# Patient Record
Sex: Female | Born: 1945
Health system: Southern US, Community
[De-identification: ages and names within clinical notes are randomized; demographics above are authoritative.]

## PROBLEM LIST (undated history)

## (undated) DIAGNOSIS — F32A Depression, unspecified: Secondary | ICD-10-CM

## (undated) DIAGNOSIS — J449 Chronic obstructive pulmonary disease, unspecified: Secondary | ICD-10-CM

## (undated) DIAGNOSIS — K648 Other hemorrhoids: Secondary | ICD-10-CM

## (undated) DIAGNOSIS — T7840XA Allergy, unspecified, initial encounter: Secondary | ICD-10-CM

## (undated) DIAGNOSIS — L900 Lichen sclerosus et atrophicus: Secondary | ICD-10-CM

## (undated) DIAGNOSIS — M4800 Spinal stenosis, site unspecified: Secondary | ICD-10-CM

## (undated) DIAGNOSIS — F419 Anxiety disorder, unspecified: Secondary | ICD-10-CM

## (undated) DIAGNOSIS — Z720 Tobacco use: Secondary | ICD-10-CM

## (undated) DIAGNOSIS — K219 Gastro-esophageal reflux disease without esophagitis: Secondary | ICD-10-CM

## (undated) DIAGNOSIS — M810 Age-related osteoporosis without current pathological fracture: Secondary | ICD-10-CM

## (undated) DIAGNOSIS — I10 Essential (primary) hypertension: Secondary | ICD-10-CM

## (undated) DIAGNOSIS — F329 Major depressive disorder, single episode, unspecified: Secondary | ICD-10-CM

## (undated) HISTORY — DX: Gastro-esophageal reflux disease without esophagitis: K21.9

## (undated) HISTORY — DX: Essential (primary) hypertension: I10

## (undated) HISTORY — DX: Other hemorrhoids: K64.8

## (undated) HISTORY — DX: Allergy, unspecified, initial encounter: T78.40XA

## (undated) HISTORY — DX: Lichen sclerosus et atrophicus: L90.0

## (undated) HISTORY — DX: Major depressive disorder, single episode, unspecified: F32.9

## (undated) HISTORY — DX: Age-related osteoporosis without current pathological fracture: M81.0

## (undated) HISTORY — DX: Depression, unspecified: F32.A

## (undated) HISTORY — DX: Tobacco use: Z72.0

## (undated) HISTORY — PX: BREAST SURGERY: SHX581

## (undated) HISTORY — DX: Chronic obstructive pulmonary disease, unspecified: J44.9

## (undated) HISTORY — DX: Spinal stenosis, site unspecified: M48.00

## (undated) HISTORY — PX: HERNIA REPAIR: SHX51

## (undated) HISTORY — DX: Anxiety disorder, unspecified: F41.9

## (undated) HISTORY — PX: EYE SURGERY: SHX253

## (undated) HISTORY — PX: TUBAL LIGATION: SHX77

## (undated) HISTORY — PX: BREAST EXCISIONAL BIOPSY: SUR124

---

## 2005-02-12 ENCOUNTER — Ambulatory Visit: Payer: Self-pay | Admitting: Orthopaedic Surgery

## 2005-10-13 ENCOUNTER — Ambulatory Visit: Payer: Self-pay | Admitting: Family Medicine

## 2005-10-13 LAB — HM COLONOSCOPY

## 2007-07-21 ENCOUNTER — Emergency Department (HOSPITAL_COMMUNITY): Admission: EM | Admit: 2007-07-21 | Discharge: 2007-07-21 | Payer: Self-pay | Admitting: Emergency Medicine

## 2010-07-17 LAB — HM PAP SMEAR

## 2010-07-23 ENCOUNTER — Ambulatory Visit: Payer: Self-pay

## 2011-01-07 LAB — BASIC METABOLIC PANEL
CO2: 25
Chloride: 104
GFR calc Af Amer: >60
Glucose, Bld: 108 — ABNORMAL HIGH
Potassium: 3.4 — ABNORMAL LOW
Sodium: 138

## 2012-07-11 ENCOUNTER — Emergency Department: Payer: Self-pay | Admitting: Emergency Medicine

## 2014-03-03 ENCOUNTER — Ambulatory Visit: Payer: Self-pay

## 2014-03-03 LAB — HM MAMMOGRAPHY

## 2014-03-05 ENCOUNTER — Emergency Department: Payer: Self-pay | Admitting: Emergency Medicine

## 2014-05-31 ENCOUNTER — Ambulatory Visit: Payer: Self-pay | Admitting: Ophthalmology

## 2014-06-28 ENCOUNTER — Ambulatory Visit: Payer: Self-pay | Admitting: Ophthalmology

## 2014-08-11 ENCOUNTER — Other Ambulatory Visit: Payer: Self-pay | Admitting: Unknown Physician Specialty

## 2014-08-11 DIAGNOSIS — M810 Age-related osteoporosis without current pathological fracture: Secondary | ICD-10-CM

## 2014-08-24 ENCOUNTER — Ambulatory Visit: Payer: Self-pay

## 2014-09-06 ENCOUNTER — Ambulatory Visit
Admission: RE | Admit: 2014-09-06 | Discharge: 2014-09-06 | Disposition: A | Discharge status: Home / Self Care | Payer: Medicare Other | Source: Ambulatory Visit | Attending: Unknown Physician Specialty | Admitting: Unknown Physician Specialty

## 2014-09-06 DIAGNOSIS — M810 Age-related osteoporosis without current pathological fracture: Secondary | ICD-10-CM | POA: Insufficient documentation

## 2014-09-06 LAB — HM DEXA SCAN

## 2014-12-11 ENCOUNTER — Other Ambulatory Visit: Payer: Self-pay

## 2014-12-11 MED ORDER — LOSARTAN POTASSIUM 50 MG PO TABS
50.0000 mg | ORAL_TABLET | Freq: Every day | ORAL | Status: DC
Start: 2014-12-11 — End: 2015-03-05

## 2014-12-11 NOTE — Telephone Encounter (Signed)
Patient was last seen 09/04/14 with a follow-up in 6 months, practice partner number is [id number], and pharmacy is [place].

## 2015-02-27 DIAGNOSIS — J449 Chronic obstructive pulmonary disease, unspecified: Secondary | ICD-10-CM | POA: Insufficient documentation

## 2015-02-27 DIAGNOSIS — I10 Essential (primary) hypertension: Secondary | ICD-10-CM | POA: Insufficient documentation

## 2015-02-27 DIAGNOSIS — F329 Major depressive disorder, single episode, unspecified: Secondary | ICD-10-CM | POA: Insufficient documentation

## 2015-02-27 DIAGNOSIS — F32A Depression, unspecified: Secondary | ICD-10-CM

## 2015-02-27 DIAGNOSIS — F17210 Nicotine dependence, cigarettes, uncomplicated: Secondary | ICD-10-CM | POA: Insufficient documentation

## 2015-02-27 DIAGNOSIS — F411 Generalized anxiety disorder: Secondary | ICD-10-CM | POA: Insufficient documentation

## 2015-02-27 DIAGNOSIS — M4800 Spinal stenosis, site unspecified: Secondary | ICD-10-CM | POA: Insufficient documentation

## 2015-02-27 DIAGNOSIS — K648 Other hemorrhoids: Secondary | ICD-10-CM

## 2015-02-27 DIAGNOSIS — J441 Chronic obstructive pulmonary disease with (acute) exacerbation: Secondary | ICD-10-CM | POA: Insufficient documentation

## 2015-02-27 DIAGNOSIS — Z720 Tobacco use: Secondary | ICD-10-CM

## 2015-02-27 DIAGNOSIS — J309 Allergic rhinitis, unspecified: Secondary | ICD-10-CM

## 2015-02-27 DIAGNOSIS — L900 Lichen sclerosus et atrophicus: Secondary | ICD-10-CM

## 2015-02-27 DIAGNOSIS — K219 Gastro-esophageal reflux disease without esophagitis: Secondary | ICD-10-CM

## 2015-02-27 DIAGNOSIS — F419 Anxiety disorder, unspecified: Secondary | ICD-10-CM

## 2015-02-27 DIAGNOSIS — M810 Age-related osteoporosis without current pathological fracture: Secondary | ICD-10-CM

## 2015-03-05 ENCOUNTER — Ambulatory Visit
Admission: RE | Admit: 2015-03-05 | Discharge: 2015-03-05 | Disposition: A | Discharge status: Home / Self Care | Payer: Medicare Other | Source: Ambulatory Visit | Attending: Unknown Physician Specialty | Admitting: Unknown Physician Specialty

## 2015-03-05 ENCOUNTER — Ambulatory Visit (INDEPENDENT_AMBULATORY_CARE_PROVIDER_SITE_OTHER): Payer: Medicare Other | Admitting: Unknown Physician Specialty

## 2015-03-05 ENCOUNTER — Encounter: Payer: Self-pay | Admitting: Unknown Physician Specialty

## 2015-03-05 VITALS — BP 122/79 | HR 73 | Temp 98.0°F | Ht 61.7 in | Wt 107.9 lb

## 2015-03-05 DIAGNOSIS — R05 Cough: Secondary | ICD-10-CM

## 2015-03-05 DIAGNOSIS — J449 Chronic obstructive pulmonary disease, unspecified: Secondary | ICD-10-CM | POA: Diagnosis not present

## 2015-03-05 DIAGNOSIS — K219 Gastro-esophageal reflux disease without esophagitis: Secondary | ICD-10-CM

## 2015-03-05 DIAGNOSIS — I10 Essential (primary) hypertension: Secondary | ICD-10-CM | POA: Diagnosis not present

## 2015-03-05 DIAGNOSIS — R059 Cough, unspecified: Secondary | ICD-10-CM

## 2015-03-05 MED ORDER — OMEPRAZOLE 20 MG PO CPDR: 20.0000 mg | DELAYED_RELEASE_CAPSULE | Freq: Every day | ORAL | Status: DC

## 2015-03-05 MED ORDER — ALENDRONATE SODIUM 70 MG PO TABS: 70.0000 mg | ORAL_TABLET | ORAL | Status: DC

## 2015-03-05 MED ORDER — ALBUTEROL SULFATE HFA 108 (90 BASE) MCG/ACT IN AERS: 1.0000 | INHALATION_SPRAY | Freq: Four times a day (QID) | RESPIRATORY_TRACT | Status: DC | PRN

## 2015-03-05 MED ORDER — LOSARTAN POTASSIUM 50 MG PO TABS: 50.0000 mg | ORAL_TABLET | Freq: Every day | ORAL | Status: DC

## 2015-03-05 NOTE — Assessment & Plan Note (Addendum)
Recent flare.  May need increase in treatment.  Will get x-ray.  Rx for Symbicort.

## 2015-03-05 NOTE — Progress Notes (Signed)
BP 122/79 mmHg  Pulse 73  Temp(Src) 98 F (36.7 C)  Ht 5' 1.7" (1.567 m)  Wt 107 lb 14.4 oz (48.943 kg)  BMI 19.93 kg/m2  SpO2 97%   Subjective:    Patient ID: Lauren Nolan, female    DOB: Nov 12, 1945, 69 y.o.   MRN: [id number]  HPI: Lauren Nolan is a 69 y.o. female  Chief Complaint  Patient presents with  . Hypertension  . Medication Refill    pt states she needs refill on inhaler, fosamax, and omeprazole   Hypertension Using medications without difficulty   No problems or lightheadedness No chest pain with exertion or shortness of breath No Edema   COPD Feels symptoms are well controlled until recently when she had an increase in cough.  Using Proair BID Using medications without problems Night time symptoms none ER visits since last visit: none Missed work or school::none She continues to smoke  GERD No symptoms while on medications.    Relevant past medical, surgical, family and social history reviewed and updated as indicated. Interim medical history since our last visit reviewed. Allergies and medications reviewed and updated.  Review of Systems  Per HPI unless specifically indicated above     Objective:    BP 122/79 mmHg  Pulse 73  Temp(Src) 98 F (36.7 C)  Ht 5' 1.7" (1.567 m)  Wt 107 lb 14.4 oz (48.943 kg)  BMI 19.93 kg/m2  SpO2 97%  Wt Readings from Last 3 Encounters:  03/05/15 107 lb 14.4 oz (48.943 kg)  09/04/14 109 lb (49.442 kg)    Physical Exam  Constitutional: She is oriented to person, place, and time. She appears well-developed and well-nourished. No distress.  HENT:  Head: Normocephalic and atraumatic.  Eyes: Conjunctivae and lids are normal. Right eye exhibits no discharge. Left eye exhibits no discharge. No scleral icterus.  Cardiovascular: Normal rate, regular rhythm and normal heart sounds.   Pulmonary/Chest: Effort normal. No respiratory distress. She has decreased breath sounds in the left lower field.  Abdominal:  Normal appearance and bowel sounds are normal. She exhibits no distension. There is no splenomegaly or hepatomegaly. There is no tenderness.  Musculoskeletal: Normal range of motion.  Neurological: She is alert and oriented to person, place, and time.  Skin: Skin is intact. No rash noted. No pallor.  Psychiatric: She has a normal mood and affect. Her behavior is normal. Judgment and thought content normal.      Assessment & Plan:   Problem List Items Addressed This Visit      Unprioritized   GERD (gastroesophageal reflux disease)    Stable, continue present medications.       Relevant Medications   omeprazole (PRILOSEC) 20 MG capsule   Hypertension - Primary    Stable, continue present medications.       Relevant Medications   losartan (COZAAR) 50 MG tablet   COPD (chronic obstructive pulmonary disease) (HCC)    Recent flare.  May need increase in treatment.  Will get x-ray.  Rx for Symbicort.        Relevant Medications   albuterol (PROVENTIL HFA;VENTOLIN HFA) 108 (90 BASE) MCG/ACT inhaler   Other Relevant Orders   DG Chest 2 View    Other Visit Diagnoses    Cough        Relevant Orders    DG Chest 2 View        Follow up plan: Return for physical and Spirometry.

## 2015-03-05 NOTE — Assessment & Plan Note (Signed)
Stable, continue present medications.   

## 2015-04-02 ENCOUNTER — Encounter: Payer: Self-pay | Admitting: Family Medicine

## 2015-04-02 ENCOUNTER — Ambulatory Visit (INDEPENDENT_AMBULATORY_CARE_PROVIDER_SITE_OTHER): Payer: Medicare Other | Admitting: Family Medicine

## 2015-04-02 VITALS — BP 133/78 | HR 118 | Temp 98.7°F | Ht 61.3 in | Wt 108.0 lb

## 2015-04-02 DIAGNOSIS — J329 Chronic sinusitis, unspecified: Secondary | ICD-10-CM | POA: Insufficient documentation

## 2015-04-02 DIAGNOSIS — J019 Acute sinusitis, unspecified: Secondary | ICD-10-CM | POA: Diagnosis not present

## 2015-04-02 DIAGNOSIS — J449 Chronic obstructive pulmonary disease, unspecified: Secondary | ICD-10-CM

## 2015-04-02 MED ORDER — AMOXICILLIN-POT CLAVULANATE 875-125 MG PO TABS: 1.0000 | ORAL_TABLET | Freq: Two times a day (BID) | ORAL | Status: DC

## 2015-04-02 NOTE — Assessment & Plan Note (Signed)
Discussed sinusitis care and treatment use of antibiotics in 10 years over-the-counter medications such as Tylenol Cold and sinus Allegra-D etc.

## 2015-04-02 NOTE — Assessment & Plan Note (Signed)
Is cut back on smoking and has stopping smoking as a New Year's resolution

## 2015-04-02 NOTE — Progress Notes (Signed)
   BP 133/78 mmHg  Pulse 118  Temp(Src) 98.7 F (37.1 C)  Ht 5' 1.3" (1.557 m)  Wt 108 lb (48.988 kg)  BMI 20.21 kg/m2  SpO2 99%   Subjective:    Patient ID: Lauren Nolan, female    DOB: 20-May-1945, 69 y.o.   MRN: [id number]  HPI: Lauren Nolan is a 69 y.o. female  Chief Complaint  Patient presents with  . URI   for 2-3 nights patient with fever or chills agent with a lot of congestion and facial pain and pressure chest with cough cold has tried some all in all p.m. last night didn't do much Has been achy all over and getting hoarse. Relevant past medical, surgical, family and social history reviewed and updated as indicated. Interim medical history since our last visit reviewed. Allergies and medications reviewed and updated.  Review of Systems  Constitutional: Positive for fever, chills, diaphoresis and fatigue.  HENT: Positive for congestion, ear pain, rhinorrhea, sinus pressure, sneezing, sore throat and voice change.   Respiratory: Positive for cough, shortness of breath and wheezing.   Cardiovascular: Negative for chest pain, palpitations and leg swelling.  Gastrointestinal: Negative for abdominal pain and abdominal distention.    Per HPI unless specifically indicated above     Objective:    BP 133/78 mmHg  Pulse 118  Temp(Src) 98.7 F (37.1 C)  Ht 5' 1.3" (1.557 m)  Wt 108 lb (48.988 kg)  BMI 20.21 kg/m2  SpO2 99%  Wt Readings from Last 3 Encounters:  04/02/15 108 lb (48.988 kg)  03/05/15 107 lb 14.4 oz (48.943 kg)  09/04/14 109 lb (49.442 kg)    Physical Exam  Constitutional: She is oriented to person, place, and time. She appears well-developed and well-nourished. No distress.  HENT:  Head: Normocephalic and atraumatic.  Right Ear: Hearing and external ear normal.  Left Ear: Hearing and external ear normal.  Nose: Nose normal.  Mouth/Throat: Oropharyngeal exudate present.  Eyes: Conjunctivae and lids are normal. Right eye exhibits no discharge.  Left eye exhibits no discharge. No scleral icterus.  Cardiovascular: Normal rate, regular rhythm and normal heart sounds.   Pulmonary/Chest: Effort normal and breath sounds normal. No respiratory distress. She has no wheezes.  Occasional rhonchi  Musculoskeletal: Normal range of motion.  Lymphadenopathy:    She has no cervical adenopathy.  Neurological: She is alert and oriented to person, place, and time.  Skin: Skin is intact. No rash noted.  Psychiatric: She has a normal mood and affect. Her speech is normal and behavior is normal. Judgment and thought content normal. Cognition and memory are normal.        Assessment & Plan:   Problem List Items Addressed This Visit      Respiratory   Sinusitis - Primary    Discussed sinusitis care and treatment use of antibiotics in 10 years over-the-counter medications such as Tylenol Cold and sinus Allegra-D etc.      Relevant Medications   amoxicillin-clavulanate (AUGMENTIN) 875-125 MG tablet   COPD (chronic obstructive pulmonary disease) (HCC)    Is cut back on smoking and has stopping smoking as a New Year's resolution          Follow up plan: Return if symptoms worsen or fail to improve, for As scheduled.

## 2015-04-13 ENCOUNTER — Telehealth: Payer: Self-pay | Admitting: Family Medicine

## 2015-04-13 NOTE — Telephone Encounter (Signed)
Pt called stated she has completed all of the antibiotics from her acute visit and she feels better but still has a lingering cough, wants to know if something else can be sent to the pharmacy. Pharm is [facility]. Thanks.

## 2015-04-17 NOTE — Telephone Encounter (Signed)
Needs to wait,as cough can last for weeks

## 2015-04-17 NOTE — Telephone Encounter (Signed)
Left message no Rx yet,per MAC

## 2015-05-07 ENCOUNTER — Ambulatory Visit (INDEPENDENT_AMBULATORY_CARE_PROVIDER_SITE_OTHER): Payer: Medicare Other | Admitting: Unknown Physician Specialty

## 2015-05-07 ENCOUNTER — Encounter: Payer: Self-pay | Admitting: Unknown Physician Specialty

## 2015-05-07 VITALS — BP 140/83 | HR 74 | Temp 98.3°F | Ht 61.3 in | Wt 108.2 lb

## 2015-05-07 DIAGNOSIS — J449 Chronic obstructive pulmonary disease, unspecified: Secondary | ICD-10-CM | POA: Diagnosis not present

## 2015-05-07 DIAGNOSIS — I10 Essential (primary) hypertension: Secondary | ICD-10-CM | POA: Diagnosis not present

## 2015-05-07 DIAGNOSIS — Z0000 Encounter for general adult medical examination without abnormal findings: Secondary | ICD-10-CM

## 2015-05-07 LAB — MICROALBUMIN, URINE WAIVED
CREATININE, URINE WAIVED: 10 mg/dL (ref 10–300)
Microalb, Ur Waived: 10 mg/L (ref 0–19)

## 2015-05-07 LAB — LIPID PANEL PICCOLO, WAIVED
Chol/HDL Ratio Piccolo,Waive: 2.1 mg/dL
Cholesterol Piccolo, Waived: 176 mg/dL (ref <200)
HDL Chol Piccolo, Waived: 83 mg/dL (ref >59)
LDL CHOL CALC PICCOLO WAIVED: 69 mg/dL (ref <100)
Triglycerides Piccolo,Waived: 119 mg/dL (ref <150)
VLDL CHOL CALC PICCOLO,WAIVE: 24 mg/dL (ref <30)

## 2015-05-07 NOTE — Progress Notes (Signed)
BP 140/83 mmHg  Pulse 74  Temp(Src) 98.3 F (36.8 C)  Ht 5' 1.3" (1.557 m)  Wt 108 lb 3.7 oz (49.093 kg)  BMI 20.25 kg/m2  SpO2 94%   Subjective:    Patient ID: Lauren Nolan, female    DOB: 25-Mar-1946, 70 y.o.   MRN: [id number]  HPI: Lauren Nolan is a 70 y.o. female  Chief Complaint  Patient presents with  . Medicare Wellness   Hypertension Using medications without difficulty Average home BPs: not checking   No problems or lightheadedness No chest pain with exertion or shortness of breath No Edema  COPD Uses Proair BID.  She has cut down smoking tremendously.  She is smoking about 5/day.  She is coughing in the AM.  Discussed low dose CT scanning  Depression screen [facility] 2/9 05/07/2015  Decreased Interest 0  Down, Depressed, Hopeless 0  PHQ - 2 Score 0    Functional Status Survey: Is the patient deaf or have difficulty hearing?: No Does the patient have difficulty seeing, even when wearing glasses/contacts?: No Does the patient have difficulty concentrating, remembering, or making decisions?: Yes (pt states she has trouble remembering sometimes) Does the patient have difficulty walking or climbing stairs?: No Does the patient have difficulty dressing or bathing?: No Does the patient have difficulty doing errands alone such as visiting a doctor's office or shopping?: No  Fall Risk  05/07/2015  Falls in the past year? No     Relevant past medical, surgical, family and social history reviewed and updated as indicated. Interim medical history since our last visit reviewed. Allergies and medications reviewed and updated.  Review of Systems  Constitutional: Negative.   HENT: Negative.   Eyes: Negative.   Respiratory: Negative.   Cardiovascular: Negative.   Gastrointestinal: Negative.   Endocrine: Negative.   Genitourinary:       Lichen sclerosis.  Uses occasional Temovate.    Musculoskeletal: Negative.   Skin: Negative.   Allergic/Immunologic: Negative.    Neurological: Negative.   Hematological: Negative.   Psychiatric/Behavioral: Negative.     Per HPI unless specifically indicated above     Objective:    BP 140/83 mmHg  Pulse 74  Temp(Src) 98.3 F (36.8 C)  Ht 5' 1.3" (1.557 m)  Wt 108 lb 3.7 oz (49.093 kg)  BMI 20.25 kg/m2  SpO2 94%  Wt Readings from Last 3 Encounters:  05/07/15 108 lb 3.7 oz (49.093 kg)  04/02/15 108 lb (48.988 kg)  03/05/15 107 lb 14.4 oz (48.943 kg)    Physical Exam  Constitutional: She is oriented to person, place, and time. She appears well-developed and well-nourished.  HENT:  Head: Normocephalic and atraumatic.  Eyes: Pupils are equal, round, and reactive to light. Right eye exhibits no discharge. Left eye exhibits no discharge. No scleral icterus.  Neck: Normal range of motion. Neck supple. Carotid bruit is not present. No thyromegaly present.  Cardiovascular: Normal rate, regular rhythm and normal heart sounds.  Exam reveals no gallop and no friction rub.   No murmur heard. Pulmonary/Chest: Effort normal and breath sounds normal. No respiratory distress. She has no wheezes. She has no rales.  Abdominal: Soft. Bowel sounds are normal. There is no tenderness. There is no rebound.  Genitourinary: No breast swelling, tenderness or discharge.  Musculoskeletal: Normal range of motion.  Lymphadenopathy:    She has no cervical adenopathy.  Neurological: She is alert and oriented to person, place, and time.  Skin: Skin is warm, dry and  intact. No rash noted.  Psychiatric: She has a normal mood and affect. Her speech is normal and behavior is normal. Judgment and thought content normal. Cognition and memory are normal.     Spirometry without change.    Assessment & Plan:   Problem List Items Addressed This Visit      Unprioritized   Hypertension   Relevant Orders   Comprehensive metabolic panel   Lipid Panel [place], Waived   Microalbumin, Urine Waived   COPD (chronic obstructive pulmonary  disease) ([place]) - Primary   Relevant Orders   Spirometry with Graph (Completed)    Other Visit Diagnoses    Routine general medical examination at a health care facility        Relevant Orders    Hepatitis C antibody       Diagnosis stable  Follow up plan: Return in about 6 months (around 11/04/2015).

## 2015-05-08 ENCOUNTER — Encounter: Payer: Self-pay | Admitting: Unknown Physician Specialty

## 2015-05-08 LAB — COMPREHENSIVE METABOLIC PANEL
ALK PHOS: 55 IU/L (ref 39–117)
ALT: 17 IU/L (ref 0–32)
AST: 27 IU/L (ref 0–40)
Albumin/Globulin Ratio: 1.8 (ref 1.1–2.5)
Albumin: 4.8 g/dL (ref 3.6–4.8)
BUN/Creatinine Ratio: 12 (ref 11–26)
BUN: 11 mg/dL (ref 8–27)
Bilirubin Total: 0.7 mg/dL (ref 0.0–1.2)
CALCIUM: 10 mg/dL (ref 8.7–10.3)
CO2: 24 mmol/L (ref 18–29)
CREATININE: 0.89 mg/dL (ref 0.57–1.00)
Chloride: 99 mmol/L (ref 96–106)
GFR calc Af Amer: 77 mL/min/{1.73_m2} (ref >59)
GFR, EST NON AFRICAN AMERICAN: 67 mL/min/{1.73_m2} (ref >59)
GLUCOSE: 99 mg/dL (ref 65–99)
Globulin, Total: 2.7 g/dL (ref 1.5–4.5)
Potassium: 5 mmol/L (ref 3.5–5.2)
SODIUM: 141 mmol/L (ref 134–144)
Total Protein: 7.5 g/dL (ref 6.0–8.5)

## 2015-05-08 LAB — HEPATITIS C ANTIBODY

## 2015-05-08 NOTE — Progress Notes (Signed)
Quick Note:  Normal labs. Patient notified by letter. ______ 

## 2015-05-16 ENCOUNTER — Telehealth: Payer: Self-pay

## 2015-05-16 NOTE — Telephone Encounter (Signed)
Pt would like to have ventoline sent to [facility].

## 2015-05-16 NOTE — Telephone Encounter (Signed)
Routing to provider  

## 2015-05-16 NOTE — Telephone Encounter (Signed)
Called and spoke to [place] at the pharmacy. He states that when they get a prescription for proair, ventolin, or proventil and insurance won't cover which ever was sent in, they change it to which ever medication will be covered because these medications are interchangable. So he said we do not need to write another prescription because he can just give her whatever insurance will cover based on the prescription that was written.

## 2015-05-16 NOTE — Telephone Encounter (Signed)
Called and let patient know that ventolin was already written for her and I also explained to her what the pharmacy said.

## 2015-05-16 NOTE — Telephone Encounter (Signed)
Called and spoke to patient about the letter she dropped out about her insurance not covering proair. I showed it to [place] and she said to have the patient call her insurance company and find out what they will cover. So I called the patient and gave her this information and asked for her to give [place] a call when she found out what insurance would cover.

## 2015-05-16 NOTE — Telephone Encounter (Signed)
Please let pt know that Ventolin is what has been written for her already.  Please ask [place] if a new prescription needs to be written.

## 2015-06-21 DIAGNOSIS — Z961 Presence of intraocular lens: Secondary | ICD-10-CM | POA: Diagnosis not present

## 2015-12-26 DIAGNOSIS — T7840XA Allergy, unspecified, initial encounter: Secondary | ICD-10-CM | POA: Diagnosis not present

## 2015-12-26 DIAGNOSIS — S80869A Insect bite (nonvenomous), unspecified lower leg, initial encounter: Secondary | ICD-10-CM | POA: Diagnosis not present

## 2016-01-04 ENCOUNTER — Other Ambulatory Visit: Payer: Self-pay | Admitting: Unknown Physician Specialty

## 2016-01-11 DIAGNOSIS — Z23 Encounter for immunization: Secondary | ICD-10-CM | POA: Diagnosis not present

## 2016-05-07 ENCOUNTER — Ambulatory Visit (INDEPENDENT_AMBULATORY_CARE_PROVIDER_SITE_OTHER): Payer: Medicare Other | Admitting: Unknown Physician Specialty

## 2016-05-07 ENCOUNTER — Encounter: Payer: Self-pay | Admitting: Unknown Physician Specialty

## 2016-05-07 VITALS — BP 127/88 | HR 97 | Temp 97.5°F | Ht 61.6 in | Wt 107.6 lb

## 2016-05-07 DIAGNOSIS — L900 Lichen sclerosus et atrophicus: Secondary | ICD-10-CM

## 2016-05-07 DIAGNOSIS — Z720 Tobacco use: Secondary | ICD-10-CM | POA: Diagnosis not present

## 2016-05-07 DIAGNOSIS — Z0000 Encounter for general adult medical examination without abnormal findings: Secondary | ICD-10-CM | POA: Diagnosis not present

## 2016-05-07 DIAGNOSIS — M810 Age-related osteoporosis without current pathological fracture: Secondary | ICD-10-CM | POA: Diagnosis not present

## 2016-05-07 DIAGNOSIS — K219 Gastro-esophageal reflux disease without esophagitis: Secondary | ICD-10-CM

## 2016-05-07 DIAGNOSIS — I10 Essential (primary) hypertension: Secondary | ICD-10-CM

## 2016-05-07 DIAGNOSIS — E875 Hyperkalemia: Secondary | ICD-10-CM | POA: Diagnosis not present

## 2016-05-07 MED ORDER — ALBUTEROL SULFATE HFA 108 (90 BASE) MCG/ACT IN AERS: 1.0000 | INHALATION_SPRAY | Freq: Four times a day (QID) | RESPIRATORY_TRACT | 3 refills | Status: DC | PRN

## 2016-05-07 MED ORDER — LOSARTAN POTASSIUM 50 MG PO TABS: 50.0000 mg | ORAL_TABLET | Freq: Every day | ORAL | 1 refills | Status: DC

## 2016-05-07 MED ORDER — CLOBETASOL PROPIONATE 0.05 % EX OINT: 1.0000 "application " | TOPICAL_OINTMENT | Freq: Two times a day (BID) | CUTANEOUS | 0 refills | Status: DC

## 2016-05-07 MED ORDER — ALENDRONATE SODIUM 70 MG PO TABS: 70.0000 mg | ORAL_TABLET | ORAL | 3 refills | Status: DC

## 2016-05-07 MED ORDER — OMEPRAZOLE 20 MG PO CPDR: 20.0000 mg | DELAYED_RELEASE_CAPSULE | Freq: Every day | ORAL | 3 refills | Status: DC

## 2016-05-07 NOTE — Assessment & Plan Note (Signed)
Continue Fosamax  

## 2016-05-07 NOTE — Assessment & Plan Note (Signed)
Refill Temovate.

## 2016-05-07 NOTE — Progress Notes (Signed)
BP 127/88 (BP Location: Left Arm, Cuff Size: Small)   Pulse 97   Temp 97.5 F (36.4 C)   Ht 5' 1.6" (1.565 m)   Wt 107 lb 9.6 oz (48.8 kg)   SpO2 100%   BMI 19.94 kg/m    Subjective:    Patient ID: Lauren Nolan, female    DOB: [birth year], 71 y.o.   MRN: [id number]  HPI: [name] is a 71 y.o. female  Chief Complaint  Patient presents with  . Medicare Wellness   Functional Status Survey: Is the patient deaf or have difficulty hearing?: No Does the patient have difficulty seeing, even when wearing glasses/contacts?: No Does the patient have difficulty concentrating, remembering, or making decisions?: No Does the patient have difficulty walking or climbing stairs?: No Does the patient have difficulty dressing or bathing?: No Does the patient have difficulty doing errands alone such as visiting a doctor's office or shopping?: No  Fall Risk  05/07/2016 05/07/2015  Falls in the past year? No No   Depression screen [facility] 2/9 05/07/2016 05/07/2015  Decreased Interest 0 0  Down, Depressed, Hopeless 0 0  PHQ - 2 Score 0 0  Altered sleeping 0 -  Tired, decreased energy (No Data) -  Change in appetite 0 -  Feeling bad or failure about yourself  0 -  Trouble concentrating 0 -  Moving slowly or fidgety/restless 0 -  Suicidal thoughts 0 -  PHQ-9 Score 0 -    Mini mental is negative  Hypertension Using medications without difficulty Average home BPs Not checking   No problems or lightheadedness No chest pain with exertion or shortness of breath No Edema  COPD Feels symptoms are well controlled Using medications without problems:Has to take inhaler once a day Night time symptoms: no ER visits since last visit:none Missed work or school::no Increased cough:no Increased SOB: no Smoking 1/2 ppd and trying to quit  GERD Stable on current medication   Relevant past medical, surgical, family and social history reviewed and updated as indicated. Interim medical history  since our last visit reviewed. Allergies and medications reviewed and updated.  Review of Systems  Constitutional: Negative.   HENT: Negative.   Eyes: Negative.   Respiratory: Negative.   Cardiovascular: Negative.   Endocrine: Negative.   Genitourinary: Positive for vaginal pain.       Vaginal pain and painful intercourse.  Every once a while having some blistering.  Hx of biopsy  Musculoskeletal:       Left shoulder pain improving    Per HPI unless specifically indicated above     Objective:    BP 127/88 (BP Location: Left Arm, Cuff Size: Small)   Pulse 97   Temp 97.5 F (36.4 C)   Ht 5' 1.6" (1.565 m)   Wt 107 lb 9.6 oz (48.8 kg)   SpO2 100%   BMI 19.94 kg/m   Wt Readings from Last 3 Encounters:  05/07/16 107 lb 9.6 oz (48.8 kg)  05/07/15 108 lb 3.7 oz (49.1 kg)  04/02/15 108 lb (49 kg)    Physical Exam  Constitutional: She is oriented to person, place, and time. She appears well-developed and well-nourished.  HENT:  Head: Normocephalic and atraumatic.  Eyes: Pupils are equal, round, and reactive to light. Right eye exhibits no discharge. Left eye exhibits no discharge. No scleral icterus.  Neck: Normal range of motion. Neck supple. Carotid bruit is not present. No thyromegaly present.  Cardiovascular: Normal rate, regular rhythm and  normal heart sounds.  Exam reveals no gallop and no friction rub.   No murmur heard. Pulmonary/Chest: Effort normal and breath sounds normal. No respiratory distress. She has no wheezes. She has no rales.  Abdominal: Soft. Bowel sounds are normal. There is no tenderness. There is no rebound.  Genitourinary: No breast swelling, tenderness or discharge.  Musculoskeletal: Normal range of motion.  Lymphadenopathy:    She has no cervical adenopathy.  Neurological: She is alert and oriented to person, place, and time.  Skin: Skin is warm, dry and intact. No rash noted.  Psychiatric: She has a normal mood and affect. Her speech is normal and  behavior is normal. Judgment and thought content normal. Cognition and memory are normal.    Assessment & Plan:   Problem List Items Addressed This Visit      Unprioritized   GERD (gastroesophageal reflux disease)    Stable, continue present medications.        Relevant Medications   omeprazole (PRILOSEC) 20 MG capsule   Hypertension    Stable, continue present medications.        Relevant Medications   losartan (COZAAR) 50 MG tablet   Other Relevant Orders   Lipid Panel w/o Chol/HDL Ratio   Comprehensive metabolic panel   Lichen sclerosus    Refill Temovate.        Osteoporosis    Continue Fosamax      Relevant Medications   alendronate (FOSAMAX) 70 MG tablet   Other Relevant Orders   VITAMIN D 25 Hydroxy (Vit-D Deficiency, Fractures)   Tobacco use    Encouraged to quit       Other Visit Diagnoses    Annual physical exam    -  Primary       Follow up plan: Return in about 6 months (around 11/04/2016).

## 2016-05-07 NOTE — Assessment & Plan Note (Signed)
Stable, continue present medications.   

## 2016-05-07 NOTE — Assessment & Plan Note (Signed)
Encouraged to quit. 

## 2016-05-08 LAB — COMPREHENSIVE METABOLIC PANEL
A/G RATIO: 1.7 (ref 1.2–2.2)
ALBUMIN: 4.7 g/dL (ref 3.6–4.8)
ALT: 14 IU/L (ref 0–32)
AST: 23 IU/L (ref 0–40)
Alkaline Phosphatase: 55 IU/L (ref 39–117)
BILIRUBIN TOTAL: 0.6 mg/dL (ref 0.0–1.2)
BUN / CREAT RATIO: 16 (ref 12–28)
BUN: 13 mg/dL (ref 8–27)
CALCIUM: 10.4 mg/dL — AB (ref 8.7–10.3)
CHLORIDE: 101 mmol/L (ref 96–106)
CO2: 25 mmol/L (ref 18–29)
Creatinine, Ser: 0.83 mg/dL (ref 0.57–1.00)
GFR, EST AFRICAN AMERICAN: 83 mL/min/{1.73_m2} (ref >59)
GFR, EST NON AFRICAN AMERICAN: 72 mL/min/{1.73_m2} (ref >59)
GLOBULIN, TOTAL: 2.8 g/dL (ref 1.5–4.5)
Glucose: 94 mg/dL (ref 65–99)
POTASSIUM: 5.8 mmol/L — AB (ref 3.5–5.2)
SODIUM: 140 mmol/L (ref 134–144)
TOTAL PROTEIN: 7.5 g/dL (ref 6.0–8.5)

## 2016-05-08 LAB — LIPID PANEL W/O CHOL/HDL RATIO
CHOLESTEROL TOTAL: 180 mg/dL (ref 100–199)
HDL: 86 mg/dL (ref >39)
LDL CALC: 74 mg/dL (ref 0–99)
TRIGLYCERIDES: 98 mg/dL (ref 0–149)
VLDL Cholesterol Cal: 20 mg/dL (ref 5–40)

## 2016-05-08 LAB — VITAMIN D 25 HYDROXY (VIT D DEFICIENCY, FRACTURES): Vit D, 25-Hydroxy: 34.4 ng/mL (ref 30.0–100.0)

## 2016-05-09 NOTE — Addendum Note (Signed)
Addended by: Kathrine Haddock on: 05/09/2016 12:02 PM   Modules accepted: Orders

## 2016-05-12 ENCOUNTER — Other Ambulatory Visit: Payer: Medicare Other

## 2016-05-12 DIAGNOSIS — E875 Hyperkalemia: Secondary | ICD-10-CM

## 2016-05-13 LAB — COMPREHENSIVE METABOLIC PANEL
ALT: 14 IU/L (ref 0–32)
AST: 21 IU/L (ref 0–40)
Albumin/Globulin Ratio: 1.7 (ref 1.2–2.2)
Albumin: 4.5 g/dL (ref 3.6–4.8)
Alkaline Phosphatase: 52 IU/L (ref 39–117)
BILIRUBIN TOTAL: 0.5 mg/dL (ref 0.0–1.2)
BUN / CREAT RATIO: 12 (ref 12–28)
BUN: 13 mg/dL (ref 8–27)
CHLORIDE: 99 mmol/L (ref 96–106)
CO2: 25 mmol/L (ref 18–29)
Calcium: 9.7 mg/dL (ref 8.7–10.3)
Creatinine, Ser: 1.12 mg/dL — ABNORMAL HIGH (ref 0.57–1.00)
GFR calc non Af Amer: 50 mL/min/{1.73_m2} — ABNORMAL LOW (ref >59)
GFR, EST AFRICAN AMERICAN: 58 mL/min/{1.73_m2} — AB (ref >59)
Globulin, Total: 2.6 g/dL (ref 1.5–4.5)
Glucose: 72 mg/dL (ref 65–99)
POTASSIUM: 4.8 mmol/L (ref 3.5–5.2)
Sodium: 142 mmol/L (ref 134–144)
TOTAL PROTEIN: 7.1 g/dL (ref 6.0–8.5)

## 2016-07-09 ENCOUNTER — Telehealth: Payer: Self-pay | Admitting: Unknown Physician Specialty

## 2016-07-09 DIAGNOSIS — M25519 Pain in unspecified shoulder: Secondary | ICD-10-CM | POA: Insufficient documentation

## 2016-07-09 DIAGNOSIS — M25512 Pain in left shoulder: Secondary | ICD-10-CM | POA: Insufficient documentation

## 2016-07-09 DIAGNOSIS — M25511 Pain in right shoulder: Principal | ICD-10-CM

## 2016-07-09 DIAGNOSIS — M79602 Pain in left arm: Secondary | ICD-10-CM | POA: Insufficient documentation

## 2016-07-09 DIAGNOSIS — G8929 Other chronic pain: Secondary | ICD-10-CM

## 2016-07-09 NOTE — Telephone Encounter (Signed)
Called and let patient know that referral has been entered.

## 2016-07-09 NOTE — Telephone Encounter (Signed)
Routing to provider. Malachy Mood, I do not see in our note anything about patient possibly going to PT. Do you remember or do I need to call and ask her about it?

## 2016-07-09 NOTE — Telephone Encounter (Signed)
No, I don't see where we talked about this.  Where is she having pain?  Did she see another provider?

## 2016-07-09 NOTE — Telephone Encounter (Signed)
Patients husband called stating patient has decided that she does need PT. She is wanting Malachy Mood to put in referral to [facility] [address] for her PT.  [phone] [place]  Thanks

## 2016-07-09 NOTE — Telephone Encounter (Signed)
Called and spoke with patient. She stated that she mentioned her shoulder pain to [facility] at her last visit. She states that she was doing something at a pumpkin patch in the fall and hurt it then, states its mainly her left shoulder. She states that [place] told her that she goes to PT about once every 6 months. Patient states that she did not want to go at the time because it cost $50 to go every time. Patient states that her husband is now wanting her to go get it checked out so that is why she wants to go now.

## 2016-07-14 ENCOUNTER — Telehealth: Payer: Self-pay | Admitting: Unknown Physician Specialty

## 2016-07-14 NOTE — Telephone Encounter (Signed)
I believe they asked me for the referral.  I think she needs to come in and let me look at it to decide.

## 2016-07-14 NOTE — Telephone Encounter (Signed)
Called and spoke to patient. I let her know what Malachy Mood said and scheduled the patient to see [facility], 07/15/16, for shoulder evaluation.

## 2016-07-14 NOTE — Telephone Encounter (Signed)
Pt called in regards to her physical therapy referral and wondered if she still needed to go tomorrow. She stated that a little over a week ago she started doing shock therapy and her shoulder is feeling better.

## 2016-07-14 NOTE — Telephone Encounter (Signed)
Routing to provider for advice.

## 2016-07-15 ENCOUNTER — Ambulatory Visit (INDEPENDENT_AMBULATORY_CARE_PROVIDER_SITE_OTHER): Payer: Medicare Other | Admitting: Unknown Physician Specialty

## 2016-07-15 ENCOUNTER — Encounter: Payer: Self-pay | Admitting: Unknown Physician Specialty

## 2016-07-15 VITALS — BP 144/88 | HR 62 | Temp 98.6°F | Wt 109.6 lb

## 2016-07-15 DIAGNOSIS — M7580 Other shoulder lesions, unspecified shoulder: Secondary | ICD-10-CM | POA: Diagnosis not present

## 2016-07-15 NOTE — Progress Notes (Signed)
BP (!) 144/88 (BP Location: Left Arm, Patient Position: Sitting, Cuff Size: Small)   Pulse 62   Temp 98.6 F (37 C)   Wt 109 lb 9.6 oz (49.7 kg)   SpO2 98%   BMI 20.31 kg/m    Subjective:    Patient ID: Lauren Nolan, female    DOB: [birth year], 71 y.o.   MRN: [id number]  HPI: Lauren Nolan is a 71 y.o. female  Chief Complaint  Patient presents with  . Shoulder Pain    pt states she is here for [facility] to look at her shoulders to see if she needs to go to PT, states she hurt them back at the fall   Shoulder pain Pt state she hurt her shoulder in the fall when she hurt both her shoulders with repetitive motions helping children at the pumpkin patch.  Her left arm has not recovered "takes turns."  Gardening seems to aggravate it.  Hurts worse when she gets up.  States she used a home TENS unit and heat which has helped.    Relevant past medical, surgical, family and social history reviewed and updated as indicated. Interim medical history since our last visit reviewed. Allergies and medications reviewed and updated.  Review of Systems  Per HPI unless specifically indicated above     Objective:    BP (!) 144/88 (BP Location: Left Arm, Patient Position: Sitting, Cuff Size: Small)   Pulse 62   Temp 98.6 F (37 C)   Wt 109 lb 9.6 oz (49.7 kg)   SpO2 98%   BMI 20.31 kg/m   Wt Readings from Last 3 Encounters:  07/15/16 109 lb 9.6 oz (49.7 kg)  05/07/16 107 lb 9.6 oz (48.8 kg)  05/07/15 108 lb 3.7 oz (49.1 kg)    Physical Exam  Constitutional: She is oriented to person, place, and time. She appears well-developed and well-nourished. No distress.  HENT:  Head: Normocephalic and atraumatic.  Eyes: Conjunctivae and lids are normal. Right eye exhibits no discharge. Left eye exhibits no discharge. No scleral icterus.  Cardiovascular: Normal rate.   Pulmonary/Chest: Effort normal.  Abdominal: Normal appearance. There is no splenomegaly or hepatomegaly.  Musculoskeletal:  Normal range of motion.       Right shoulder: She exhibits normal range of motion, no tenderness, no bony tenderness, no swelling, no effusion, no crepitus, no deformity, no laceration, no pain, no spasm, normal pulse and normal strength.       Left shoulder: She exhibits normal range of motion, no tenderness, no bony tenderness, no swelling, no effusion, no crepitus, no deformity, no laceration, no pain, no spasm, normal pulse and normal strength.  Positive empty can test  Neurological: She is alert and oriented to person, place, and time.  Skin: Skin is intact. No rash noted. No pallor.  Psychiatric: She has a normal mood and affect. Her behavior is normal. Judgment and thought content normal.    Results for orders placed or performed in visit on 05/12/16  Comprehensive metabolic panel  Result Value Ref Range   Glucose 72 65 - 99 mg/dL   BUN 13 8 - 27 mg/dL   Creatinine, Ser 1.12 (H) 0.57 - 1.00 mg/dL   GFR calc non Af Amer 50 (L) >59 mL/min/1.73   GFR calc Af Amer 58 (L) >59 mL/min/1.73   BUN/Creatinine Ratio 12 12 - 28   Sodium 142 134 - 144 mmol/L   Potassium 4.8 3.5 - 5.2 mmol/L   Chloride 99 96 -  106 mmol/L   CO2 25 18 - 29 mmol/L   Calcium 9.7 8.7 - 10.3 mg/dL   Total Protein 7.1 6.0 - 8.5 g/dL   Albumin 4.5 3.6 - 4.8 g/dL   Globulin, Total 2.6 1.5 - 4.5 g/dL   Albumin/Globulin Ratio 1.7 1.2 - 2.2   Bilirubin Total 0.5 0.0 - 1.2 mg/dL   Alkaline Phosphatase 52 39 - 117 IU/L   AST 21 0 - 40 IU/L   ALT 14 0 - 32 IU/L       Assessment & Plan:   Problem List Items Addressed This Visit    None    Visit Diagnoses    Rotator cuff tendonitis, unspecified laterality    -  Primary   Refer to physical therapy for further management       Follow up plan: Return if symptoms worsen or fail to improve.

## 2016-11-05 ENCOUNTER — Ambulatory Visit: Payer: Medicare Other | Admitting: Unknown Physician Specialty

## 2016-12-22 ENCOUNTER — Other Ambulatory Visit: Payer: Self-pay | Admitting: Unknown Physician Specialty

## 2016-12-31 DIAGNOSIS — Z23 Encounter for immunization: Secondary | ICD-10-CM | POA: Diagnosis not present

## 2017-02-09 DIAGNOSIS — J209 Acute bronchitis, unspecified: Secondary | ICD-10-CM | POA: Diagnosis not present

## 2017-03-19 ENCOUNTER — Encounter: Payer: Self-pay | Admitting: Family Medicine

## 2017-03-19 ENCOUNTER — Ambulatory Visit: Payer: Medicare Other | Admitting: Family Medicine

## 2017-03-19 VITALS — BP 157/90 | HR 112 | Temp 98.3°F

## 2017-03-19 DIAGNOSIS — M25562 Pain in left knee: Secondary | ICD-10-CM | POA: Diagnosis not present

## 2017-03-19 MED ORDER — PREDNISONE 10 MG PO TABS
ORAL_TABLET | ORAL | 0 refills | Status: DC
Start: 2017-03-19 — End: 2017-03-31

## 2017-03-19 MED ORDER — ALBUTEROL SULFATE HFA 108 (90 BASE) MCG/ACT IN AERS: 1.0000 | INHALATION_SPRAY | Freq: Four times a day (QID) | RESPIRATORY_TRACT | 6 refills | Status: DC | PRN

## 2017-03-19 MED ORDER — BACLOFEN 5 MG PO TABS: 5.0000 mg | ORAL_TABLET | Freq: Three times a day (TID) | ORAL | 0 refills | Status: DC | PRN

## 2017-03-19 MED ORDER — LOSARTAN POTASSIUM 50 MG PO TABS: 50.0000 mg | ORAL_TABLET | Freq: Every day | ORAL | 0 refills | Status: DC

## 2017-03-19 NOTE — Progress Notes (Signed)
BP (!) 157/90 (BP Location: Right Arm, Patient Position: Sitting, Cuff Size: Normal)   Pulse (!) 112   Temp 98.3 F (36.8 C) (Oral)   SpO2 97%    Subjective:    Patient ID: Lauren Nolan, female    DOB: June 08, 1945, 71 y.o.   MRN: [id number]  HPI: Lauren Nolan is a 71 y.o. female  Chief Complaint  Patient presents with  . Knee Pain    Severe. Left knee. Patient fell in October. Knee started to hurt last month and has got worse drastically.    Significant left lateral knee pain intermittently x about 1 month. Only known trauma was a fall last October where she landed on both knees. Did have some bruising and scrapes with that but the injuries seemed to improve. Pain worsened by weight bearing or quick movements. Does have full ROM if moving slow. Denies swelling, redness, Cp, SOB, palpitations, hx of DVT.  Taking ibuprofen with good relief and has been wearing compression.   Relevant past medical, surgical, family and social history reviewed and updated as indicated. Interim medical history since our last visit reviewed. Allergies and medications reviewed and updated.  Review of Systems  Constitutional: Negative.   HENT: Negative.   Respiratory: Negative.   Cardiovascular: Negative.   Gastrointestinal: Negative.   Genitourinary: Negative.   Musculoskeletal: Positive for arthralgias.  Neurological: Negative.   Psychiatric/Behavioral: Negative.    Per HPI unless specifically indicated above     Objective:    BP (!) 157/90 (BP Location: Right Arm, Patient Position: Sitting, Cuff Size: Normal)   Pulse (!) 112   Temp 98.3 F (36.8 C) (Oral)   SpO2 97%   Wt Readings from Last 3 Encounters:  07/15/16 109 lb 9.6 oz (49.7 kg)  05/07/16 107 lb 9.6 oz (48.8 kg)  05/07/15 108 lb 3.7 oz (49.1 kg)    Physical Exam  Constitutional: She is oriented to person, place, and time. She appears well-developed and well-nourished. No distress.  HENT:  Head: Atraumatic.  Eyes:  Conjunctivae are normal. No scleral icterus.  Neck: Normal range of motion. Neck supple.  Cardiovascular: Normal rate, regular rhythm and normal heart sounds.  Pulmonary/Chest: Effort normal. No respiratory distress.  Musculoskeletal: She exhibits tenderness (TTP left lateral knee at joint line). She exhibits no edema or deformity.  ROM intact but stiff, pt able to perform all movements slowly with moderate pain.  - homans and squeeze test  Neurological: She is alert and oriented to person, place, and time.  Skin: Skin is warm and dry.  Psychiatric: She has a normal mood and affect. Her behavior is normal.  Nursing note and vitals reviewed.  Results for orders placed or performed in visit on 05/12/16  Comprehensive metabolic panel  Result Value Ref Range   Glucose 72 65 - 99 mg/dL   BUN 13 8 - 27 mg/dL   Creatinine, Ser 1.12 (H) 0.57 - 1.00 mg/dL   GFR calc non Af Amer 50 (L) >59 mL/min/1.73   GFR calc Af Amer 58 (L) >59 mL/min/1.73   BUN/Creatinine Ratio 12 12 - 28   Sodium 142 134 - 144 mmol/L   Potassium 4.8 3.5 - 5.2 mmol/L   Chloride 99 96 - 106 mmol/L   CO2 25 18 - 29 mmol/L   Calcium 9.7 8.7 - 10.3 mg/dL   Total Protein 7.1 6.0 - 8.5 g/dL   Albumin 4.5 3.6 - 4.8 g/dL   Globulin, Total 2.6 1.5 - 4.5 g/dL  Albumin/Globulin Ratio 1.7 1.2 - 2.2   Bilirubin Total 0.5 0.0 - 1.2 mg/dL   Alkaline Phosphatase 52 39 - 117 IU/L   AST 21 0 - 40 IU/L   ALT 14 0 - 32 IU/L      Assessment & Plan:   Problem List Items Addressed This Visit    None    Visit Diagnoses    Acute pain of left knee    -  Primary   Suspect a strain, pt agreeable to prednisone and low dose baclofen as well as RICE protocol. Will get x-ray if no improvement over next few weeks       Follow up plan: Return if symptoms worsen or fail to improve.

## 2017-03-22 NOTE — Patient Instructions (Signed)
Follow up as needed

## 2017-03-31 ENCOUNTER — Ambulatory Visit: Payer: Medicare Other | Admitting: Family Medicine

## 2017-03-31 ENCOUNTER — Encounter: Payer: Self-pay | Admitting: Family Medicine

## 2017-03-31 ENCOUNTER — Ambulatory Visit
Admission: RE | Admit: 2017-03-31 | Discharge: 2017-03-31 | Disposition: A | Discharge status: Home / Self Care | Payer: Medicare Other | Source: Ambulatory Visit | Attending: Family Medicine | Admitting: Family Medicine

## 2017-03-31 VITALS — BP 155/97 | HR 97 | Temp 98.5°F

## 2017-03-31 DIAGNOSIS — M25562 Pain in left knee: Secondary | ICD-10-CM | POA: Insufficient documentation

## 2017-03-31 DIAGNOSIS — F419 Anxiety disorder, unspecified: Secondary | ICD-10-CM | POA: Diagnosis not present

## 2017-03-31 DIAGNOSIS — M7989 Other specified soft tissue disorders: Secondary | ICD-10-CM | POA: Diagnosis not present

## 2017-03-31 MED ORDER — PREDNISONE 10 MG PO TABS: ORAL_TABLET | ORAL | 0 refills | Status: DC

## 2017-03-31 MED ORDER — DIAZEPAM 2 MG PO TABS: 2.0000 mg | ORAL_TABLET | Freq: Three times a day (TID) | ORAL | 0 refills | Status: DC | PRN

## 2017-03-31 NOTE — Progress Notes (Signed)
BP (!) 155/97   Pulse 97   Temp 98.5 F (36.9 C) (Oral)   SpO2 95%    Subjective:    Patient ID: Lauren Nolan, female    DOB: 05/26/1945, 71 y.o.   MRN: [id number]  HPI: Lauren Nolan is a 71 y.o. female  Chief Complaint  Patient presents with  . Knee Pain    pt states her left knee was getting better but she twisted it yesterday and it is hurting extremely bad    Patient presents for left knee pain follow up. Was placed on prednisone and baclofen last week with excellent relief. Was feeling almost completely better until yesterday when she twisted at her knee again trying to open the fridge. States the pain is much worse than previously now since new incident. Minimal swelling, no bruising or redness. Has been trying to rest the leg as weight bearing significantly exacerbates pain. States now she is having so much anxiety about her pain that she is terrified to even walk or go do anything.   Relevant past medical, surgical, family and social history reviewed and updated as indicated. Interim medical history since our last visit reviewed. Allergies and medications reviewed and updated.  Review of Systems  Constitutional: Negative.   Respiratory: Negative.   Cardiovascular: Negative.   Gastrointestinal: Negative.   Musculoskeletal: Positive for arthralgias, gait problem and joint swelling.  Psychiatric/Behavioral: The patient is nervous/anxious.    Per HPI unless specifically indicated above     Objective:    BP (!) 155/97   Pulse 97   Temp 98.5 F (36.9 C) (Oral)   SpO2 95%   Wt Readings from Last 3 Encounters:  07/15/16 109 lb 9.6 oz (49.7 kg)  05/07/16 107 lb 9.6 oz (48.8 kg)  05/07/15 108 lb 3.7 oz (49.1 kg)    Physical Exam  Constitutional: She is oriented to person, place, and time. She appears well-developed and well-nourished. No distress.  HENT:  Head: Atraumatic.  Eyes: Conjunctivae are normal. No scleral icterus.  Neck: Normal range of motion. Neck  supple.  Cardiovascular: Normal rate and normal heart sounds.  Pulmonary/Chest: Effort normal and breath sounds normal. No respiratory distress.  Musculoskeletal:  Walking with crutch assistance, minimal weight bearing tolerated on left leg. Left knee with trace edema, TTP below lateral joint line. ROM largely intact but exam limited by pt discomfort  Lymphadenopathy:    She has no cervical adenopathy.  Neurological: She is alert and oriented to person, place, and time.  Skin: Skin is warm and dry.  Psychiatric: She has a normal mood and affect. Her behavior is normal.  Nursing note and vitals reviewed.     Assessment & Plan:   Problem List Items Addressed This Visit    None    Visit Diagnoses    Acute pain of left knee    -  Primary   Will get another round of prednisone going as well as add valium rarely prn to help with her anxiety about pain and muscle stiffness. Await x-ray results   Relevant Orders   DG Knee Complete 4 Views Left (Completed)   Anxiety       Hoping a short supply of valium will give her confidence to return to walking after her knee pain resolves. Precautions reviewed at length, pt agreeable   Relevant Medications   diazepam (VALIUM) 2 MG tablet       Follow up plan: Return if symptoms worsen or fail to improve.

## 2017-04-01 ENCOUNTER — Telehealth: Payer: Self-pay

## 2017-04-01 NOTE — Telephone Encounter (Signed)
Patient notified

## 2017-04-01 NOTE — Telephone Encounter (Signed)
Copied from [place] [id number]. Topic: General - Other >> Apr 01, 2017  9:54 AM Yvette Rack wrote: Reason for CRM: patient calling about her xray results of her lt knee please call pt with results

## 2017-04-02 NOTE — Patient Instructions (Signed)
Follow up as needed

## 2017-04-30 ENCOUNTER — Other Ambulatory Visit: Payer: Self-pay | Admitting: Unknown Physician Specialty

## 2017-04-30 NOTE — Telephone Encounter (Signed)
Last visit 03/31/17

## 2017-04-30 NOTE — Telephone Encounter (Signed)
Copied from [place] [id number]. Topic: Quick Communication - Rx Refill/Question >> Apr 30, 2017  2:57 PM Lolita Rieger, [place] wrote: Medication:prednisone 10 mg and diazepam 2 mg   Has the patient contacted their pharmacy? no   (Agent: If no, request that the patient contact the pharmacy for the refill.)   Preferred Pharmacy (with phone number or street name): [facility]   Agent: Please be advised that RX refills may take up to 3 business days. We ask that you follow-up with your pharmacy.

## 2017-04-30 NOTE — Telephone Encounter (Signed)
Copied from [place] [id number]. Topic: Quick Communication - Rx Refill/Question >> Apr 30, 2017  2:57 PM Lolita Rieger, [place] wrote: Medication:prednisone 10 mg and diazepam 2 mg   Has the patient contacted their pharmacy? no   (Agent: If no, request that the patient contact the pharmacy for the refill.)   Preferred Pharmacy (with phone number or street name): [facility]   Agent: Please be advised that RX refills may take up to 3 business days. We ask that you follow-up with your pharmacy.

## 2017-05-01 NOTE — Telephone Encounter (Signed)
Please schedule patient an appointment.  

## 2017-05-01 NOTE — Telephone Encounter (Signed)
Pt needs to be seen

## 2017-05-04 ENCOUNTER — Ambulatory Visit: Payer: Medicare Other | Admitting: Family Medicine

## 2017-05-04 ENCOUNTER — Encounter: Payer: Self-pay | Admitting: Family Medicine

## 2017-05-04 VITALS — BP 145/83 | HR 76 | Temp 97.9°F | Wt 109.7 lb

## 2017-05-04 DIAGNOSIS — R252 Cramp and spasm: Secondary | ICD-10-CM

## 2017-05-04 DIAGNOSIS — F419 Anxiety disorder, unspecified: Secondary | ICD-10-CM | POA: Diagnosis not present

## 2017-05-04 MED ORDER — BACLOFEN 5 MG PO TABS: 5.0000 | ORAL_TABLET | Freq: Two times a day (BID) | ORAL | 0 refills | Status: DC | PRN

## 2017-05-04 MED ORDER — VENLAFAXINE HCL ER 37.5 MG PO CP24: 37.5000 mg | ORAL_CAPSULE | Freq: Every day | ORAL | 1 refills | Status: DC

## 2017-05-04 NOTE — Progress Notes (Signed)
BP (!) 145/83   Pulse 76   Temp 97.9 F (36.6 C) (Oral)   Wt 109 lb 11.2 oz (49.8 kg)   SpO2 98%   BMI 20.33 kg/m    Subjective:    Patient ID: Lauren Nolan, female    DOB: 1945-06-02, 72 y.o.   MRN: [id number]  HPI: Lauren Nolan is a 72 y.o. female  Chief Complaint  Patient presents with  . Leg Pain    pt states she has been having left leg pain in her calf area, states she feels like it is muscular   Patient here for left lower leg cramping pain the past few weeks. States her knee is doing significantly better now, but as she's increasing her mobility slowly she's starting to have significant muscle cramping in the calf area on that side. Denies point tenderness, edema, redness, or new injury. Took full course of valium prn as a muscle relaxer and anxiolytic with good relief. Slowly increasing ambulation to less and less assistance.   Still having extreme anxiety with walking since this first started almost 2 months ago. States she's not had issues with anxiety and moods since quitting her very stressful job several years ago. During that, she took effexor with good relief. Feeling like she needs to be back on something because she has frequent crying spells, severe anxiety,   Past Medical History:  Diagnosis Date  . Allergy   . Anxiety   . COPD (chronic obstructive pulmonary disease) ([place])   . Depression   . GERD (gastroesophageal reflux disease)   . Hypertension   . Internal hemorrhoid   . Lichen sclerosus   . Osteoporosis   . Spinal stenosis   . Tobacco use    Social History   Socioeconomic History  . Marital status: Married    Spouse name: Not on file  . Number of children: Not on file  . Years of education: Not on file  . Highest education level: Not on file  Social Needs  . Financial resource strain: Not on file  . Food insecurity - worry: Not on file  . Food insecurity - inability: Not on file  . Transportation needs - medical: Not on file  .  Transportation needs - non-medical: Not on file  Occupational History  . Not on file  Tobacco Use  . Smoking status: Current Every Day Smoker    Packs/day: 0.50    Types: Cigarettes  . Smokeless tobacco: Never Used  Substance and Sexual Activity  . Alcohol use: Yes    Alcohol/week: 1.8 oz    Types: 3 Glasses of wine per week  . Drug use: No  . Sexual activity: Yes  Other Topics Concern  . Not on file  Social History Narrative  . Not on file   Relevant past medical, surgical, family and social history reviewed and updated as indicated. Interim medical history since our last visit reviewed. Allergies and medications reviewed and updated.  Review of Systems  Constitutional: Negative.   Respiratory: Negative.   Cardiovascular: Negative.   Gastrointestinal: Negative.   Genitourinary: Negative.   Musculoskeletal: Positive for arthralgias and myalgias.  Skin: Negative.   Neurological: Negative.   Psychiatric/Behavioral: Positive for dysphoric mood. The patient is nervous/anxious.    Per HPI unless specifically indicated above     Objective:    BP (!) 145/83   Pulse 76   Temp 97.9 F (36.6 C) (Oral)   Wt 109 lb 11.2 oz (49.8 kg)  SpO2 98%   BMI 20.33 kg/m   Wt Readings from Last 3 Encounters:  05/04/17 109 lb 11.2 oz (49.8 kg)  07/15/16 109 lb 9.6 oz (49.7 kg)  05/07/16 107 lb 9.6 oz (48.8 kg)    Physical Exam  Constitutional: She is oriented to person, place, and time. She appears well-developed and well-nourished. No distress.  HENT:  Head: Atraumatic.  Eyes: Conjunctivae are normal. Pupils are equal, round, and reactive to light. No scleral icterus.  Neck: Normal range of motion. Neck supple.  Cardiovascular: Normal rate and normal heart sounds.  Pulmonary/Chest: Effort normal and breath sounds normal. No respiratory distress.  Musculoskeletal: She exhibits tenderness (Generalized ttp left calf). She exhibits no edema.  Slow, cautious gait  Lymphadenopathy:     She has no cervical adenopathy.  Neurological: She is alert and oriented to person, place, and time.  Skin: Skin is warm and dry. No erythema.  Psychiatric: Judgment and thought content normal.  Tearful during interview  Nursing note and vitals reviewed.     Assessment & Plan:   Problem List Items Addressed This Visit      Other   Acute anxiety - Primary    Likely from significant routine changes given knee injury the past 2 months. Will restart effexor and titrate up if needed. Recommended counseling, pt refusing at this time.       Relevant Medications   venlafaxine XR (EFFEXOR XR) 37.5 MG 24 hr capsule    Other Visit Diagnoses    Leg cramping       Suspect from deconditioning now that she's increasing ambulation. Low dose baclofen, massage, stretches, epsom salt soaks. F/u if worsening or no better      Follow up plan: Return in about 6 weeks (around 06/15/2017) for Anxiety f/u.

## 2017-05-07 NOTE — Assessment & Plan Note (Signed)
Likely from significant routine changes given knee injury the past 2 months. Will restart effexor and titrate up if needed. Recommended counseling, pt refusing at this time.

## 2017-05-07 NOTE — Patient Instructions (Signed)
Follow-up in 6 weeks

## 2017-06-17 ENCOUNTER — Encounter: Payer: Medicare Other | Admitting: Unknown Physician Specialty

## 2017-06-18 ENCOUNTER — Ambulatory Visit: Payer: Medicare Other | Admitting: Family Medicine

## 2017-06-18 ENCOUNTER — Encounter: Payer: Self-pay | Admitting: Family Medicine

## 2017-06-18 VITALS — BP 137/89 | HR 87 | Temp 98.8°F | Ht 61.6 in | Wt 111.4 lb

## 2017-06-18 DIAGNOSIS — J449 Chronic obstructive pulmonary disease, unspecified: Secondary | ICD-10-CM

## 2017-06-18 DIAGNOSIS — K219 Gastro-esophageal reflux disease without esophagitis: Secondary | ICD-10-CM | POA: Diagnosis not present

## 2017-06-18 DIAGNOSIS — M810 Age-related osteoporosis without current pathological fracture: Secondary | ICD-10-CM | POA: Diagnosis not present

## 2017-06-18 DIAGNOSIS — I10 Essential (primary) hypertension: Secondary | ICD-10-CM | POA: Diagnosis not present

## 2017-06-18 DIAGNOSIS — Z1239 Encounter for other screening for malignant neoplasm of breast: Secondary | ICD-10-CM

## 2017-06-18 DIAGNOSIS — Z0000 Encounter for general adult medical examination without abnormal findings: Secondary | ICD-10-CM

## 2017-06-18 DIAGNOSIS — F419 Anxiety disorder, unspecified: Secondary | ICD-10-CM

## 2017-06-18 DIAGNOSIS — Z1231 Encounter for screening mammogram for malignant neoplasm of breast: Secondary | ICD-10-CM

## 2017-06-18 DIAGNOSIS — Z0001 Encounter for general adult medical examination with abnormal findings: Secondary | ICD-10-CM

## 2017-06-18 DIAGNOSIS — Z1211 Encounter for screening for malignant neoplasm of colon: Secondary | ICD-10-CM

## 2017-06-18 MED ORDER — VENLAFAXINE HCL ER 37.5 MG PO CP24: 37.5000 mg | ORAL_CAPSULE | Freq: Every day | ORAL | 5 refills | Status: DC

## 2017-06-18 MED ORDER — LOSARTAN POTASSIUM 50 MG PO TABS: 50.0000 mg | ORAL_TABLET | Freq: Every day | ORAL | 1 refills | Status: DC

## 2017-06-18 NOTE — Progress Notes (Signed)
BP 137/89   Pulse 87   Temp 98.8 F (37.1 C) (Oral)   Ht 5' 1.6" (1.565 m)   Wt 111 lb 6.4 oz (50.5 kg)   SpO2 98%   BMI 20.64 kg/m    Subjective:    Patient ID: Lauren Nolan, female    DOB: 1945-11-22, 72 y.o.   MRN: [id number]  HPI: [name] is a 72 y.o. female presenting on 06/18/2017 for comprehensive medical examination. Current medical complaints include:none  Doing very well with effexor, feels so much better.   She currently lives with: husband Menopausal Symptoms: no  Depression Screen done today and results listed below:  Depression screen [facility] 2/9 06/18/2017 05/07/2016 05/07/2015  Decreased Interest 0 0 0  Down, Depressed, Hopeless 0 0 0  PHQ - 2 Score 0 0 0  Altered sleeping 0 0 -  Tired, decreased energy 0 (No Data) -  Change in appetite 0 0 -  Feeling bad or failure about yourself  0 0 -  Trouble concentrating 0 0 -  Moving slowly or fidgety/restless 0 0 -  Suicidal thoughts 0 0 -  PHQ-9 Score 0 0 -    The patient does not have a history of falls. I did not complete a risk assessment for falls. A plan of care for falls was not documented.   Past Medical History:  Past Medical History:  Diagnosis Date  . Allergy   . Anxiety   . COPD (chronic obstructive pulmonary disease) ([place])   . Depression   . GERD (gastroesophageal reflux disease)   . Hypertension   . Internal hemorrhoid   . Lichen sclerosus   . Osteoporosis   . Spinal stenosis   . Tobacco use     Surgical History:  Past Surgical History:  Procedure Laterality Date  . BREAST SURGERY Bilateral    biopsy  . EYE SURGERY Bilateral Feb-Mar 2016  . HERNIA REPAIR    . TUBAL LIGATION      Medications:  Current Outpatient Medications on File Prior to Visit  Medication Sig  . albuterol (PROVENTIL HFA;VENTOLIN HFA) 108 (90 Base) MCG/ACT inhaler Inhale 1-2 puffs into the lungs every 6 (six) hours as needed for wheezing or shortness of breath.  Marland Kitchen alendronate (FOSAMAX) 70 MG tablet Take 1  tablet (70 mg total) by mouth once a week. Take with a full glass of water on an empty stomach.  Marland Kitchen aspirin 81 MG tablet Take 81 mg by mouth daily.  . cyanocobalamin 500 MCG tablet Take 500 mcg by mouth 2 (two) times daily.  Marland Kitchen omeprazole (PRILOSEC) 20 MG capsule Take 1 capsule (20 mg total) by mouth daily.   No current facility-administered medications on file prior to visit.     Allergies:  Allergies  Allergen Reactions  . Midol [Acetaminophen] Hypertension  . Sulfur     Social History:  Social History   Socioeconomic History  . Marital status: Married    Spouse name: Not on file  . Number of children: Not on file  . Years of education: Not on file  . Highest education level: Not on file  Social Needs  . Financial resource strain: Not on file  . Food insecurity - worry: Not on file  . Food insecurity - inability: Not on file  . Transportation needs - medical: Not on file  . Transportation needs - non-medical: Not on file  Occupational History  . Not on file  Tobacco Use  . Smoking status: Current  Every Day Smoker    Packs/day: 0.50    Types: Cigarettes  . Smokeless tobacco: Never Used  Substance and Sexual Activity  . Alcohol use: Yes    Alcohol/week: 1.8 oz    Types: 3 Glasses of wine per week  . Drug use: No  . Sexual activity: Yes  Other Topics Concern  . Not on file  Social History Narrative  . Not on file   Social History   Tobacco Use  Smoking Status Current Every Day Smoker  . Packs/day: 0.50  . Types: Cigarettes  Smokeless Tobacco Never Used   Social History   Substance and Sexual Activity  Alcohol Use Yes  . Alcohol/week: 1.8 oz  . Types: 3 Glasses of wine per week    Family History:  Family History  Problem Relation Age of Onset  . Dementia Mother   . Anxiety disorder Mother   . Hypertension Mother   . Epilepsy Mother   . Cancer Father        lung  . Diabetes Brother   . Colon cancer Brother   . Hypertension Daughter   . Heart  disease Maternal Grandfather        MI  . Hypertension Brother   . Diabetes Daughter   . Arthritis Daughter     Past medical history, surgical history, medications, allergies, family history and social history reviewed with patient today and changes made to appropriate areas of the chart.   Review of Systems - General ROS: negative Psychological ROS: negative Ophthalmic ROS: negative ENT ROS: negative Allergy and Immunology ROS: negative Breast ROS: negative for breast lumps Respiratory ROS: no cough, shortness of breath, or wheezing Cardiovascular ROS: no chest pain or dyspnea on exertion Gastrointestinal ROS: no abdominal pain, change in bowel habits, or black or bloody stools Genito-Urinary ROS: no dysuria, trouble voiding, or hematuria Musculoskeletal ROS: negative Neurological ROS: no TIA or stroke symptoms Dermatological ROS: negative All other ROS negative except what is listed above and in the HPI.      Objective:    BP 137/89   Pulse 87   Temp 98.8 F (37.1 C) (Oral)   Ht 5' 1.6" (1.565 m)   Wt 111 lb 6.4 oz (50.5 kg)   SpO2 98%   BMI 20.64 kg/m   Wt Readings from Last 3 Encounters:  06/18/17 111 lb 6.4 oz (50.5 kg)  05/04/17 109 lb 11.2 oz (49.8 kg)  07/15/16 109 lb 9.6 oz (49.7 kg)    Physical Exam  Constitutional: She is oriented to person, place, and time. She appears well-developed and well-nourished. No distress.  HENT:  Head: Atraumatic.  Right Ear: External ear normal.  Left Ear: External ear normal.  Nose: Nose normal.  Mouth/Throat: Oropharynx is clear and moist. No oropharyngeal exudate.  Eyes: Conjunctivae are normal. Pupils are equal, round, and reactive to light. No scleral icterus.  Neck: Normal range of motion. Neck supple. No thyromegaly present.  Cardiovascular: Normal rate, regular rhythm, normal heart sounds and intact distal pulses.  Pulmonary/Chest: Effort normal and breath sounds normal. No respiratory distress.  Abdominal: Soft.  Bowel sounds are normal. She exhibits no mass. There is no tenderness.  Musculoskeletal: Normal range of motion. She exhibits no edema or tenderness.  Lymphadenopathy:    She has no cervical adenopathy.  Neurological: She is alert and oriented to person, place, and time. No cranial nerve deficit.  Skin: Skin is warm and dry. No rash noted.  Psychiatric: She has a normal mood  and affect. Her behavior is normal.  Nursing note and vitals reviewed.   Results for orders placed or performed in visit on 06/18/17  Microscopic Examination  Result Value Ref Range   WBC, UA 0-5 0 - 5 /hpf   RBC, UA 0-2 0 - 2 /hpf   Epithelial Cells (non renal) 0-10 0 - 10 /hpf   Renal Epithel, UA 0-10 (A) None seen /hpf   Bacteria, UA Few None seen/Few  Urine Culture, Reflex  Result Value Ref Range   Urine Culture, Routine Final report (A)    Organism ID, Bacteria Comment (A)   CBC with Differential/Platelet  Result Value Ref Range   WBC 10.7 3.4 - 10.8 x10E3/uL   RBC 4.85 3.77 - 5.28 x10E6/uL   Hemoglobin 16.1 (H) 11.1 - 15.9 g/dL   Hematocrit 47.0 (H) 34.0 - 46.6 %   MCV 97 79 - 97 fL   MCH 33.2 (H) 26.6 - 33.0 pg   MCHC 34.3 31.5 - 35.7 g/dL   RDW 13.2 12.3 - 15.4 %   Platelets 303 150 - 379 x10E3/uL   Neutrophils 66 Not Estab. %   Lymphs 20 Not Estab. %   Monocytes 10 Not Estab. %   Eos 3 Not Estab. %   Basos 1 Not Estab. %   Neutrophils Absolute 7.1 (H) 1.4 - 7.0 x10E3/uL   Lymphocytes Absolute 2.1 0.7 - 3.1 x10E3/uL   Monocytes Absolute 1.0 (H) 0.1 - 0.9 x10E3/uL   EOS (ABSOLUTE) 0.3 0.0 - 0.4 x10E3/uL   Basophils Absolute 0.1 0.0 - 0.2 x10E3/uL   Immature Granulocytes 0 Not Estab. %   Immature Grans (Abs) 0.0 0.0 - 0.1 x10E3/uL  Comprehensive metabolic panel  Result Value Ref Range   Glucose 88 65 - 99 mg/dL   BUN 9 8 - 27 mg/dL   Creatinine, Ser 0.66 0.57 - 1.00 mg/dL   GFR calc non Af Amer 90 >59 mL/min/1.73   GFR calc Af Amer 104 >59 mL/min/1.73   BUN/Creatinine Ratio 14 12 - 28    Sodium 135 134 - 144 mmol/L   Potassium 4.3 3.5 - 5.2 mmol/L   Chloride 95 (L) 96 - 106 mmol/L   CO2 25 20 - 29 mmol/L   Calcium 9.3 8.7 - 10.3 mg/dL   Total Protein 7.4 6.0 - 8.5 g/dL   Albumin 4.9 (H) 3.5 - 4.8 g/dL   Globulin, Total 2.5 1.5 - 4.5 g/dL   Albumin/Globulin Ratio 2.0 1.2 - 2.2   Bilirubin Total 0.7 0.0 - 1.2 mg/dL   Alkaline Phosphatase 58 39 - 117 IU/L   AST 26 0 - 40 IU/L   ALT 16 0 - 32 IU/L  Lipid Panel w/o Chol/HDL Ratio  Result Value Ref Range   Cholesterol, Total 160 100 - 199 mg/dL   Triglycerides 102 0 - 149 mg/dL   HDL 98 >39 mg/dL   VLDL Cholesterol Cal 20 5 - 40 mg/dL   LDL Calculated 42 0 - 99 mg/dL  UA/M w/rflx Culture, Routine  Result Value Ref Range   Specific Gravity, UA 1.010 1.005 - 1.030   pH, UA 7.0 5.0 - 7.5   Color, UA Yellow Yellow   Appearance Ur Clear Clear   Leukocytes, UA 2+ (A) Negative   Protein, UA Negative Negative/Trace   Glucose, UA Negative Negative   Ketones, UA Negative Negative   RBC, UA Trace (A) Negative   Bilirubin, UA Negative Negative   Urobilinogen, Ur 0.2 0.2 - 1.0 mg/dL  Nitrite, UA Negative Negative   Microscopic Examination See below:    Urinalysis Reflex Comment       Assessment & Plan:   Problem List Items Addressed This Visit      Cardiovascular and Mediastinum   Hypertension    Stable, continue current regimen and DASH diet      Relevant Medications   losartan (COZAAR) 50 MG tablet   Other Relevant Orders   CBC with Differential/Platelet (Completed)   Comprehensive metabolic panel (Completed)   Lipid Panel w/o Chol/HDL Ratio (Completed)   UA/M w/rflx Culture, Routine (Completed)     Respiratory   COPD (chronic obstructive pulmonary disease) ([facility])    Stable on prn albuterol. Continue current regimen        Digestive   GERD (gastroesophageal reflux disease)    Stable on prilosec, continue current regimen        Musculoskeletal and Integument   Osteoporosis    Stable, continue  current regimen. Last DXA in 2016        Other   Acute anxiety    Stable, much improved with effexor. Continue current regimen      Relevant Medications   venlafaxine XR (EFFEXOR XR) 37.5 MG 24 hr capsule    Other Visit Diagnoses    Annual physical exam    -  Primary   Screening for colon cancer       Relevant Orders   Ambulatory referral to Gastroenterology   Screening for breast cancer       Relevant Orders   MM DIGITAL SCREENING BILATERAL       Follow up plan: Return in about 6 months (around 12/19/2017) for BP, mood f/u.  IMMUNIZATIONS:   - Tdap: Tetanus vaccination status reviewed: last tetanus booster within 10 years. - Influenza: Up to date - Pneumovax: Up to date - Prevnar: Up to date - Zostavax vaccine: Up to date  SCREENING: -Mammogram: Ordered today  - Colonoscopy: Ordered today  - Bone Density: Up to date   PATIENT COUNSELING:   Advised to take 1 mg of folate supplement per day if capable of pregnancy.   Sexuality: Discussed sexually transmitted diseases, partner selection, use of condoms, avoidance of unintended pregnancy  and contraceptive alternatives.   Advised to avoid cigarette smoking.  I discussed with the patient that most people either abstain from alcohol or drink within safe limits (<=14/week and <=4 drinks/occasion for males, <=7/weeks and <= 3 drinks/occasion for females) and that the risk for alcohol disorders and other health effects rises proportionally with the number of drinks per week and how often a drinker exceeds daily limits.  Discussed cessation/primary prevention of drug use and availability of treatment for abuse.   Diet: Encouraged to adjust caloric intake to maintain  or achieve ideal body weight, to reduce intake of dietary saturated fat and total fat, to limit sodium intake by avoiding high sodium foods and not adding table salt, and to maintain adequate dietary potassium and calcium preferably from fresh fruits, vegetables,  and low-fat dairy products.    stressed the importance of regular exercise  Injury prevention: Discussed safety belts, safety helmets, smoke detector, smoking near bedding or upholstery.   Dental health: Discussed importance of regular tooth brushing, flossing, and dental visits.    NEXT PREVENTATIVE PHYSICAL DUE IN 1 YEAR. Return in about 6 months (around 12/19/2017) for BP, mood f/u.

## 2017-06-18 NOTE — Patient Instructions (Signed)
[facility] - [phone]    

## 2017-06-19 ENCOUNTER — Telehealth: Payer: Self-pay

## 2017-06-19 LAB — COMPREHENSIVE METABOLIC PANEL
ALK PHOS: 58 IU/L (ref 39–117)
ALT: 16 IU/L (ref 0–32)
AST: 26 IU/L (ref 0–40)
Albumin/Globulin Ratio: 2 (ref 1.2–2.2)
Albumin: 4.9 g/dL — ABNORMAL HIGH (ref 3.5–4.8)
BUN/Creatinine Ratio: 14 (ref 12–28)
BUN: 9 mg/dL (ref 8–27)
Bilirubin Total: 0.7 mg/dL (ref 0.0–1.2)
CO2: 25 mmol/L (ref 20–29)
CREATININE: 0.66 mg/dL (ref 0.57–1.00)
Calcium: 9.3 mg/dL (ref 8.7–10.3)
Chloride: 95 mmol/L — ABNORMAL LOW (ref 96–106)
GFR calc Af Amer: 104 mL/min/{1.73_m2} (ref >59)
GFR calc non Af Amer: 90 mL/min/{1.73_m2} (ref >59)
GLUCOSE: 88 mg/dL (ref 65–99)
Globulin, Total: 2.5 g/dL (ref 1.5–4.5)
Potassium: 4.3 mmol/L (ref 3.5–5.2)
Sodium: 135 mmol/L (ref 134–144)
Total Protein: 7.4 g/dL (ref 6.0–8.5)

## 2017-06-19 LAB — CBC WITH DIFFERENTIAL/PLATELET
Basophils Absolute: 0.1 10*3/uL (ref 0.0–0.2)
Basos: 1 %
EOS (ABSOLUTE): 0.3 10*3/uL (ref 0.0–0.4)
Eos: 3 %
HEMOGLOBIN: 16.1 g/dL — AB (ref 11.1–15.9)
Hematocrit: 47 % — ABNORMAL HIGH (ref 34.0–46.6)
Immature Grans (Abs): 0 10*3/uL (ref 0.0–0.1)
Immature Granulocytes: 0 %
LYMPHS ABS: 2.1 10*3/uL (ref 0.7–3.1)
Lymphs: 20 %
MCH: 33.2 pg — ABNORMAL HIGH (ref 26.6–33.0)
MCHC: 34.3 g/dL (ref 31.5–35.7)
MCV: 97 fL (ref 79–97)
MONOCYTES: 10 %
MONOS ABS: 1 10*3/uL — AB (ref 0.1–0.9)
Neutrophils Absolute: 7.1 10*3/uL — ABNORMAL HIGH (ref 1.4–7.0)
Neutrophils: 66 %
Platelets: 303 10*3/uL (ref 150–379)
RBC: 4.85 x10E6/uL (ref 3.77–5.28)
RDW: 13.2 % (ref 12.3–15.4)
WBC: 10.7 10*3/uL (ref 3.4–10.8)

## 2017-06-19 LAB — LIPID PANEL W/O CHOL/HDL RATIO
CHOLESTEROL TOTAL: 160 mg/dL (ref 100–199)
HDL: 98 mg/dL (ref >39)
LDL CALC: 42 mg/dL (ref 0–99)
TRIGLYCERIDES: 102 mg/dL (ref 0–149)
VLDL CHOLESTEROL CAL: 20 mg/dL (ref 5–40)

## 2017-06-19 NOTE — Telephone Encounter (Signed)
Gastroenterology Pre-Procedure Review  Request Date: 07/20/17 Requesting Physician: Dr. Marius Ditch    PATIENT REVIEW QUESTIONS: The patient responded to the following health history questions as indicated:    1. Are you having any GI issues? No  2. Do you have a personal history of Polyps? Yes, unsure type  3. Do you have a family history of Colon Cancer or Polyps? Yes, Brother Colon Ca, age 71 4. Diabetes Mellitus? No  5. Joint replacements in the past 12 months? No  6. Major health problems in the past 3 months? No  7. Any artificial heart valves, MVP, or defibrillator? No     MEDICATIONS & ALLERGIES:    Patient reports the following regarding taking any anticoagulation/antiplatelet therapy:   Plavix, Coumadin, Eliquis, Xarelto, Lovenox, Pradaxa, Brilinta, or Effient? No  Aspirin? 81 mg   Patient confirms/reports the following medications:  Current Outpatient Medications  Medication Sig Dispense Refill  . albuterol (PROVENTIL HFA;VENTOLIN HFA) 108 (90 Base) MCG/ACT inhaler Inhale 1-2 puffs into the lungs every 6 (six) hours as needed for wheezing or shortness of breath. 1 Inhaler 6  . alendronate (FOSAMAX) 70 MG tablet Take 1 tablet (70 mg total) by mouth once a week. Take with a full glass of water on an empty stomach. 12 tablet 3  . aspirin 81 MG tablet Take 81 mg by mouth daily.    . Baclofen 5 MG TABS Take 5 tablets by mouth 2 (two) times daily as needed. (Patient not taking: Reported on 06/18/2017) 60 tablet 0  . cyanocobalamin 500 MCG tablet Take 500 mcg by mouth 2 (two) times daily.    Marland Kitchen losartan (COZAAR) 50 MG tablet Take 1 tablet (50 mg total) by mouth daily. 90 tablet 1  . omeprazole (PRILOSEC) 20 MG capsule Take 1 capsule (20 mg total) by mouth daily. 90 capsule 3  . venlafaxine XR (EFFEXOR XR) 37.5 MG 24 hr capsule Take 1 capsule (37.5 mg total) by mouth daily with breakfast. Take 1 daily x 1 week, increase to 2 tabs daily if tolerated at that point 60 capsule 5   No current  facility-administered medications for this visit.     Patient confirms/reports the following allergies:  Allergies  Allergen Reactions  . Sulfur     No orders of the defined types were placed in this encounter.   AUTHORIZATION INFORMATION Primary Insurance: 1D#: Group #:  Secondary Insurance: 1D#: Group #:  SCHEDULE INFORMATION: Date: 07/20/17 Time: Location: [facility]

## 2017-06-20 LAB — UA/M W/RFLX CULTURE, ROUTINE
BILIRUBIN UA: NEGATIVE
GLUCOSE, UA: NEGATIVE
Ketones, UA: NEGATIVE
Nitrite, UA: NEGATIVE
Protein, UA: NEGATIVE
SPEC GRAV UA: 1.01 (ref 1.005–1.030)
Urobilinogen, Ur: 0.2 mg/dL (ref 0.2–1.0)
pH, UA: 7 (ref 5.0–7.5)

## 2017-06-20 LAB — MICROSCOPIC EXAMINATION

## 2017-06-20 LAB — URINE CULTURE, REFLEX

## 2017-06-21 NOTE — Assessment & Plan Note (Signed)
Stable, continue current regimen. Last DXA in 2016

## 2017-06-21 NOTE — Assessment & Plan Note (Signed)
Stable on prilosec, continue current regimen 

## 2017-06-21 NOTE — Assessment & Plan Note (Signed)
Stable on prn albuterol. Continue current regimen 

## 2017-06-21 NOTE — Assessment & Plan Note (Signed)
Stable, continue current regimen and DASH diet

## 2017-06-21 NOTE — Assessment & Plan Note (Signed)
Stable, much improved with effexor. Continue current regimen

## 2017-06-22 ENCOUNTER — Encounter: Payer: Self-pay | Admitting: Family Medicine

## 2017-06-28 ENCOUNTER — Other Ambulatory Visit: Payer: Self-pay

## 2017-06-30 ENCOUNTER — Other Ambulatory Visit: Payer: Self-pay

## 2017-06-30 DIAGNOSIS — Z1211 Encounter for screening for malignant neoplasm of colon: Secondary | ICD-10-CM

## 2017-07-10 ENCOUNTER — Ambulatory Visit
Admission: RE | Admit: 2017-07-10 | Discharge: 2017-07-10 | Disposition: A | Discharge status: Home / Self Care | Payer: Medicare Other | Source: Ambulatory Visit | Attending: Family Medicine | Admitting: Family Medicine

## 2017-07-10 DIAGNOSIS — Z1231 Encounter for screening mammogram for malignant neoplasm of breast: Secondary | ICD-10-CM | POA: Insufficient documentation

## 2017-07-10 DIAGNOSIS — Z1239 Encounter for other screening for malignant neoplasm of breast: Secondary | ICD-10-CM

## 2017-07-16 ENCOUNTER — Telehealth: Payer: Self-pay

## 2017-07-16 NOTE — Telephone Encounter (Signed)
Patient contacted office to reschedule her colonoscopy from 07/20/17 to 08/03/17 with Dr. Vicente Males because she was not feeling well. Trish in Endo has been contacted to change colonoscopy date to 08/03/17 with Dr. Vicente Males.  Thanks [employer]

## 2017-08-03 ENCOUNTER — Ambulatory Visit: Admission: RE | Admit: 2017-08-03 | Payer: Medicare Other | Source: Ambulatory Visit | Admitting: Gastroenterology

## 2017-08-03 ENCOUNTER — Encounter: Admission: RE | Payer: Self-pay | Source: Ambulatory Visit

## 2017-08-03 SURGERY — COLONOSCOPY WITH PROPOFOL
Anesthesia: General

## 2017-09-24 ENCOUNTER — Other Ambulatory Visit: Payer: Self-pay | Admitting: Unknown Physician Specialty

## 2017-10-12 ENCOUNTER — Ambulatory Visit: Payer: Medicare Other | Admitting: Unknown Physician Specialty

## 2017-10-12 ENCOUNTER — Encounter: Payer: Self-pay | Admitting: Unknown Physician Specialty

## 2017-10-12 VITALS — BP 154/91 | HR 105 | Temp 98.4°F | Ht 59.75 in | Wt 104.6 lb

## 2017-10-12 DIAGNOSIS — F419 Anxiety disorder, unspecified: Secondary | ICD-10-CM | POA: Diagnosis not present

## 2017-10-12 DIAGNOSIS — R21 Rash and other nonspecific skin eruption: Secondary | ICD-10-CM | POA: Diagnosis not present

## 2017-10-12 DIAGNOSIS — F329 Major depressive disorder, single episode, unspecified: Secondary | ICD-10-CM | POA: Diagnosis not present

## 2017-10-12 DIAGNOSIS — L509 Urticaria, unspecified: Secondary | ICD-10-CM | POA: Diagnosis not present

## 2017-10-12 MED ORDER — PREDNISONE 10 MG PO TABS: 10.0000 mg | ORAL_TABLET | Freq: Every day | ORAL | 0 refills | Status: DC

## 2017-10-12 MED ORDER — VENLAFAXINE HCL ER 37.5 MG PO CP24: 112.5000 mg | ORAL_CAPSULE | Freq: Every day | ORAL | 0 refills | Status: DC

## 2017-10-12 MED ORDER — BETAMETHASONE DIPROPIONATE AUG 0.05 % EX CREA: TOPICAL_CREAM | Freq: Two times a day (BID) | CUTANEOUS | 0 refills | Status: DC

## 2017-10-12 NOTE — Assessment & Plan Note (Signed)
Increase Effexor to 37.5 mg 3/day.  Recheck in about 1 month

## 2017-10-12 NOTE — Assessment & Plan Note (Signed)
This is mostly resolved.  Anxiety seems to be her main issue.

## 2017-10-12 NOTE — Progress Notes (Addendum)
BP (!) 154/91 (BP Location: Left Arm, Cuff Size: Small)   Pulse (!) 105   Temp 98.4 F (36.9 C) (Oral)   Ht 4' 11.75" (1.518 m)   Wt 104 lb 9.6 oz (47.4 kg)   SpO2 96%   BMI 20.60 kg/m    Subjective:    Patient ID: Lauren Nolan, female    DOB: 03-25-1946, 72 y.o.   MRN: [id number]  HPI: Lauren Nolan is a 72 y.o. female  Chief Complaint  Patient presents with  . Rash    pt states she has a rash that started on the right side of her chest 2 days ago, has since moved to left breast, stomach, and back. States the rash itches off and on.  . Night Sweats    pt states she has been having off and on night sweats for a while    Rash Pt states she started a rash last night that started upper right chest.  Rash has spread to her other breast, down to stomach and in her back.  Area itches.  States something stung her on the right arm that day and put something on that, but no new lotions or creams.  No history of bed bugs.  No new foods.  No fever, nausea, vomiting, or diarrhea.  No headache.  Had a tick bite a few weeks ago.    She does admit to stress. Night sweats seem to be associated.  Having a lot of family issues.  Started on Venlafaxine on 04/2017 and wonders if we can increase the dose.  Currently takes 37.5 mg 2 Daily.   Depression screen [facility] 2/9 10/12/2017 06/18/2017 05/07/2016 05/07/2015  Decreased Interest 0 0 0 0  Down, Depressed, Hopeless 0 0 0 0  PHQ - 2 Score 0 0 0 0  Altered sleeping 0 0 0 -  Tired, decreased energy 0 0 (No Data) -  Change in appetite 0 0 0 -  Feeling bad or failure about yourself  0 0 0 -  Trouble concentrating 0 0 0 -  Moving slowly or fidgety/restless 0 0 0 -  Suicidal thoughts 0 0 0 -  PHQ-9 Score 0 0 0 -     Relevant past medical, surgical, family and social history reviewed and updated as indicated. Interim medical history since our last visit reviewed. Allergies and medications reviewed and updated.  Review of Systems  Per HPI unless  specifically indicated above     Objective:    BP (!) 154/91 (BP Location: Left Arm, Cuff Size: Small)   Pulse (!) 105   Temp 98.4 F (36.9 C) (Oral)   Ht 4' 11.75" (1.518 m)   Wt 104 lb 9.6 oz (47.4 kg)   SpO2 96%   BMI 20.60 kg/m   Wt Readings from Last 3 Encounters:  10/12/17 104 lb 9.6 oz (47.4 kg)  06/18/17 111 lb 6.4 oz (50.5 kg)  05/04/17 109 lb 11.2 oz (49.8 kg)    Physical Exam  Constitutional: She is oriented to person, place, and time. She appears well-developed and well-nourished. No distress.  HENT:  Head: Normocephalic and atraumatic.  Eyes: Conjunctivae and lids are normal. Right eye exhibits no discharge. Left eye exhibits no discharge. No scleral icterus.  Neck: Normal range of motion. Neck supple. No JVD present. Carotid bruit is not present.  Cardiovascular: Normal rate, regular rhythm and normal heart sounds.  Pulmonary/Chest: Effort normal and breath sounds normal.  Abdominal: Normal appearance. There is no  splenomegaly or hepatomegaly.  Musculoskeletal: Normal range of motion.  Neurological: She is alert and oriented to person, place, and time.  Skin: Skin is intact. Rash noted. No pallor.  Pt with macular rash on areas described above.    Psychiatric: She has a normal mood and affect. Her behavior is normal. Judgment and thought content normal.    Results for orders placed or performed in visit on 06/18/17  Microscopic Examination  Result Value Ref Range   WBC, UA 0-5 0 - 5 /hpf   RBC, UA 0-2 0 - 2 /hpf   Epithelial Cells (non renal) 0-10 0 - 10 /hpf   Renal Epithel, UA 0-10 (A) None seen /hpf   Bacteria, UA Few None seen/Few  Urine Culture, Reflex  Result Value Ref Range   Urine Culture, Routine Final report (A)    Organism ID, Bacteria Comment (A)   CBC with Differential/Platelet  Result Value Ref Range   WBC 10.7 3.4 - 10.8 x10E3/uL   RBC 4.85 3.77 - 5.28 x10E6/uL   Hemoglobin 16.1 (H) 11.1 - 15.9 g/dL   Hematocrit 47.0 (H) 34.0 - 46.6 %     MCV 97 79 - 97 fL   MCH 33.2 (H) 26.6 - 33.0 pg   MCHC 34.3 31.5 - 35.7 g/dL   RDW 13.2 12.3 - 15.4 %   Platelets 303 150 - 379 x10E3/uL   Neutrophils 66 Not Estab. %   Lymphs 20 Not Estab. %   Monocytes 10 Not Estab. %   Eos 3 Not Estab. %   Basos 1 Not Estab. %   Neutrophils Absolute 7.1 (H) 1.4 - 7.0 x10E3/uL   Lymphocytes Absolute 2.1 0.7 - 3.1 x10E3/uL   Monocytes Absolute 1.0 (H) 0.1 - 0.9 x10E3/uL   EOS (ABSOLUTE) 0.3 0.0 - 0.4 x10E3/uL   Basophils Absolute 0.1 0.0 - 0.2 x10E3/uL   Immature Granulocytes 0 Not Estab. %   Immature Grans (Abs) 0.0 0.0 - 0.1 x10E3/uL  Comprehensive metabolic panel  Result Value Ref Range   Glucose 88 65 - 99 mg/dL   BUN 9 8 - 27 mg/dL   Creatinine, Ser 0.66 0.57 - 1.00 mg/dL   GFR calc non Af Amer 90 >59 mL/min/1.73   GFR calc Af Amer 104 >59 mL/min/1.73   BUN/Creatinine Ratio 14 12 - 28   Sodium 135 134 - 144 mmol/L   Potassium 4.3 3.5 - 5.2 mmol/L   Chloride 95 (L) 96 - 106 mmol/L   CO2 25 20 - 29 mmol/L   Calcium 9.3 8.7 - 10.3 mg/dL   Total Protein 7.4 6.0 - 8.5 g/dL   Albumin 4.9 (H) 3.5 - 4.8 g/dL   Globulin, Total 2.5 1.5 - 4.5 g/dL   Albumin/Globulin Ratio 2.0 1.2 - 2.2   Bilirubin Total 0.7 0.0 - 1.2 mg/dL   Alkaline Phosphatase 58 39 - 117 IU/L   AST 26 0 - 40 IU/L   ALT 16 0 - 32 IU/L  Lipid Panel w/o Chol/HDL Ratio  Result Value Ref Range   Cholesterol, Total 160 100 - 199 mg/dL   Triglycerides 102 0 - 149 mg/dL   HDL 98 >39 mg/dL   VLDL Cholesterol Cal 20 5 - 40 mg/dL   LDL Calculated 42 0 - 99 mg/dL  UA/M w/rflx Culture, Routine  Result Value Ref Range   Specific Gravity, UA 1.010 1.005 - 1.030   pH, UA 7.0 5.0 - 7.5   Color, UA Yellow Yellow   Appearance Ur Clear  Clear   Leukocytes, UA 2+ (A) Negative   Protein, UA Negative Negative/Trace   Glucose, UA Negative Negative   Ketones, UA Negative Negative   RBC, UA Trace (A) Negative   Bilirubin, UA Negative Negative   Urobilinogen, Ur 0.2 0.2 - 1.0 mg/dL    Nitrite, UA Negative Negative   Microscopic Examination See below:    Urinalysis Reflex Comment       Assessment & Plan:   Problem List Items Addressed This Visit      Unprioritized   Acute anxiety    Increase Effexor to 37.5 mg 3/day.  Recheck in about 1 month      Relevant Medications   venlafaxine XR (EFFEXOR XR) 37.5 MG 24 hr capsule   Depression    This is mostly resolved.  Anxiety seems to be her main issue.        Relevant Medications   venlafaxine XR (EFFEXOR XR) 37.5 MG 24 hr capsule    Other Visit Diagnoses    Urticaria    -  Primary   New problem.  Rx for Prednisone taper and Diprolene.  Check alpha gal, RMSF, and Lyme titers.     Relevant Orders   Lyme Ab/Western Blot Reflex   Rocky mtn spotted fvr abs pnl(IgG+IgM)   Alpha Gal IgE   Rash       Check alpha-gal.  Lyme and RMSF titers due to recent tick bite.     Relevant Orders   Lyme Ab/Western Blot Reflex   Rocky mtn spotted fvr abs pnl(IgG+IgM)   Alpha Gal IgE       Follow up plan: Return in about 1 month (around 11/09/2017) for with Apolonio Schneiders.

## 2017-10-13 ENCOUNTER — Telehealth: Payer: Self-pay | Admitting: *Deleted

## 2017-10-13 DIAGNOSIS — Z87891 Personal history of nicotine dependence: Secondary | ICD-10-CM

## 2017-10-13 DIAGNOSIS — Z122 Encounter for screening for malignant neoplasm of respiratory organs: Secondary | ICD-10-CM

## 2017-10-13 NOTE — Telephone Encounter (Signed)
Received referral for initial lung cancer screening scan. Contacted patient and obtained smoking history,(current, 39.75 pack year) as well as answering questions related to screening process. Patient denies signs of lung cancer such as weight loss or hemoptysis. Patient denies comorbidity that would prevent curative treatment if lung cancer were found. Patient is scheduled for shared decision making visit and CT scan on 10/27/17.

## 2017-10-14 ENCOUNTER — Telehealth: Payer: Self-pay | Admitting: Unknown Physician Specialty

## 2017-10-14 ENCOUNTER — Encounter: Payer: Self-pay | Admitting: Family Medicine

## 2017-10-14 MED ORDER — TRIAMCINOLONE ACETONIDE 0.1 % EX CREA: 1.0000 "application " | TOPICAL_CREAM | Freq: Two times a day (BID) | CUTANEOUS | 0 refills | Status: DC

## 2017-10-14 MED ORDER — DOXYCYCLINE HYCLATE 100 MG PO TABS: 100.0000 mg | ORAL_TABLET | Freq: Two times a day (BID) | ORAL | 0 refills | Status: DC

## 2017-10-14 NOTE — Telephone Encounter (Signed)
Yes.  Also see my previous notes.  I am also calling in an antibiotic

## 2017-10-14 NOTE — Telephone Encounter (Signed)
Copied from Andrew [phone]. Topic: General - Other >> Oct 14, 2017 10:24 AM Alfredia Ferguson R wrote: Rodena Piety from [facility] is calling stating medication request for augmented betamethasone dipropionate (DIPROLENE AF) 0.05 % cream they were not able to approve   Callback # [phone] opt 5

## 2017-10-14 NOTE — Telephone Encounter (Signed)
PA was completed and denied by the patient's insurance. Malachy Mood, is there another medication we can send in for the patient?

## 2017-10-14 NOTE — Telephone Encounter (Signed)
Insurance approved 10/13/17-10/14/18

## 2017-10-14 NOTE — Telephone Encounter (Signed)
Patient notified, see documentation in result note as well.

## 2017-10-16 DIAGNOSIS — Z0000 Encounter for general adult medical examination without abnormal findings: Secondary | ICD-10-CM | POA: Diagnosis not present

## 2017-10-20 LAB — LYME, WESTERN BLOT, SERUM (REFLEXED)
IGG P18 AB.: ABSENT
IGG P23 AB.: ABSENT
IGG P30 AB.: ABSENT
IGG P39 AB.: ABSENT
IGG P58 AB.: ABSENT
IGG P66 AB.: ABSENT
IGG P93 AB.: ABSENT
IGM P41 AB.: ABSENT
IgG P28 Ab.: ABSENT
IgG P41 Ab.: ABSENT
IgG P45 Ab.: ABSENT
Lyme IgG Wb: NEGATIVE
Lyme IgM Wb: POSITIVE — AB

## 2017-10-20 LAB — ROCKY MTN SPOTTED FVR ABS PNL(IGG+IGM)
RMSF IGG: NEGATIVE
RMSF IGM: 2.5 {index} — AB (ref 0.00–0.89)

## 2017-10-20 LAB — LYME AB/WESTERN BLOT REFLEX: LYME DISEASE AB, QUANT, IGM: 1.93 index — ABNORMAL HIGH (ref 0.00–0.79)

## 2017-10-20 LAB — ALPHA GAL IGE: ALPHA GAL IGE: 27.4 kU/L — AB (ref <0.10)

## 2017-10-22 ENCOUNTER — Encounter: Payer: Self-pay | Admitting: Family Medicine

## 2017-10-22 ENCOUNTER — Ambulatory Visit: Payer: Medicare Other | Admitting: Family Medicine

## 2017-10-22 VITALS — BP 144/88 | HR 103 | Temp 98.7°F | Ht 59.75 in | Wt 106.2 lb

## 2017-10-22 DIAGNOSIS — R21 Rash and other nonspecific skin eruption: Secondary | ICD-10-CM

## 2017-10-22 MED ORDER — DOXYCYCLINE HYCLATE 100 MG PO TABS: 100.0000 mg | ORAL_TABLET | Freq: Two times a day (BID) | ORAL | 0 refills | Status: DC

## 2017-10-22 NOTE — Progress Notes (Signed)
BP (!) 144/88 (BP Location: Right Arm, Patient Position: Sitting, Cuff Size: Normal)   Pulse (!) 103   Temp 98.7 F (37.1 C) (Oral)   Ht 4' 11.75" (1.518 m)   Wt 106 lb 3.2 oz (48.2 kg)   SpO2 96%   BMI 20.91 kg/m    Subjective:    Patient ID: Lauren Nolan, female    DOB: [birth year], 72 y.o.   MRN: [id number]  HPI: Lauren Nolan is a 72 y.o. female  Chief Complaint  Patient presents with  . Rash    Seen 10/12/2017. Patient breaking out in rash again in different places. Still on antibiotic and prednisone.   Pt here today following up on an urticarial rash she was seen 10 days ago for. Given prednisone taper and doxycycline as tick titers for both Rmsf and lyme were positive. Rash improved initially but several isolated new patches of the rash have now appeared. Intensely itchy. Denies fevers, chills, body aches, but does have a very mild headache.   Relevant past medical, surgical, family and social history reviewed and updated as indicated. Interim medical history since our last visit reviewed. Allergies and medications reviewed and updated.  Review of Systems  Per HPI unless specifically indicated above     Objective:    BP (!) 144/88 (BP Location: Right Arm, Patient Position: Sitting, Cuff Size: Normal)   Pulse (!) 103   Temp 98.7 F (37.1 C) (Oral)   Ht 4' 11.75" (1.518 m)   Wt 106 lb 3.2 oz (48.2 kg)   SpO2 96%   BMI 20.91 kg/m   Wt Readings from Last 3 Encounters:  10/22/17 106 lb 3.2 oz (48.2 kg)  10/12/17 104 lb 9.6 oz (47.4 kg)  06/18/17 111 lb 6.4 oz (50.5 kg)    Physical Exam  Constitutional: She is oriented to person, place, and time. She appears well-developed and well-nourished. No distress.  HENT:  Head: Atraumatic.  Eyes: Pupils are equal, round, and reactive to light. EOM are normal.  Neck: Normal range of motion. Neck supple.  Cardiovascular: Normal rate and regular rhythm.  Pulmonary/Chest: Effort normal and breath sounds normal.    Musculoskeletal: Normal range of motion.  Neurological: She is alert and oriented to person, place, and time.  Skin: Skin is warm and dry. Rash (two areas on trunk with 3-4 erythematous papules in a grouping) noted.  Psychiatric: She has a normal mood and affect. Her behavior is normal.  Nursing note and vitals reviewed.   Results for orders placed or performed in visit on 10/12/17  Lyme Ab/Western Blot Reflex  Result Value Ref Range   Lyme IgG/IgM Ab <0.91 0.00 - 0.90 ISR   LYME DISEASE AB, QUANT, IGM 1.93 (H) 0.00 - 0.79 index  Rocky mtn spotted fvr abs pnl(IgG+IgM)  Result Value Ref Range   RMSF IgG Negative Negative   RMSF IgM 2.50 (H) 0.00 - 0.89 index  Alpha Gal IgE  Result Value Ref Range   Alpha Gal IgE* 27.40 (H) <0.10 kU/L  Lyme, Western Blot, Serum (reflexed)  Result Value Ref Range     IgG P93 Ab. Absent      IgG P66 Ab. Absent      IgG P58 Ab. Absent      IgG P45 Ab. Absent      IgG P41 Ab. Absent      IgG P39 Ab. Absent      IgG P30 Ab. Absent      IgG P28  Ab. Absent      IgG P23 Ab. Absent      IgG P18 Ab. Absent    Lyme IgG Wb Negative      IgM P41 Ab. Absent      IgM P39 Ab. Present (A)      IgM P23 Ab. Present (A)    Lyme IgM Wb Positive (A)       Assessment & Plan:   Problem List Items Addressed This Visit    None    Visit Diagnoses    Rash    -  Primary   Given + lyme, will extend doxy for 10 more days to cover both tick dz's. Complete prednisone, will extend if needed. Triamcinolone cream, antihistamines       Follow up plan: Return if symptoms worsen or fail to improve.

## 2017-10-25 NOTE — Patient Instructions (Signed)
Follow up as needed

## 2017-10-26 ENCOUNTER — Telehealth: Payer: Self-pay | Admitting: *Deleted

## 2017-10-26 NOTE — Telephone Encounter (Signed)
Patient requests to cancel appointment for lung screening scan.

## 2017-10-27 ENCOUNTER — Inpatient Hospital Stay: Payer: Medicare Other | Admitting: Oncology

## 2017-10-27 ENCOUNTER — Ambulatory Visit: Payer: Medicare Other

## 2017-11-12 ENCOUNTER — Ambulatory Visit: Payer: Medicare Other | Admitting: Physician Assistant

## 2017-11-16 ENCOUNTER — Encounter: Payer: Self-pay | Admitting: Physician Assistant

## 2017-11-16 ENCOUNTER — Ambulatory Visit: Payer: Medicare Other | Admitting: Physician Assistant

## 2017-11-16 VITALS — BP 155/98 | HR 100 | Temp 98.5°F | Ht 59.75 in | Wt 107.4 lb

## 2017-11-16 DIAGNOSIS — R21 Rash and other nonspecific skin eruption: Secondary | ICD-10-CM | POA: Diagnosis not present

## 2017-11-16 DIAGNOSIS — F419 Anxiety disorder, unspecified: Secondary | ICD-10-CM

## 2017-11-16 DIAGNOSIS — I10 Essential (primary) hypertension: Secondary | ICD-10-CM | POA: Diagnosis not present

## 2017-11-16 DIAGNOSIS — F329 Major depressive disorder, single episode, unspecified: Secondary | ICD-10-CM

## 2017-11-16 MED ORDER — VENLAFAXINE HCL ER 75 MG PO CP24: ORAL_CAPSULE | ORAL | 0 refills | Status: DC

## 2017-11-16 MED ORDER — VENLAFAXINE HCL ER 37.5 MG PO CP24: ORAL_CAPSULE | ORAL | 0 refills | Status: DC

## 2017-11-16 MED ORDER — LOSARTAN POTASSIUM 100 MG PO TABS: 100.0000 mg | ORAL_TABLET | Freq: Every day | ORAL | 0 refills | Status: DC

## 2017-11-16 NOTE — Patient Instructions (Signed)

## 2017-11-16 NOTE — Progress Notes (Signed)
Subjective:    Patient ID: Lauren Nolan, female    DOB: 1945-08-22, 72 y.o.   MRN: [id number]  Lauren Nolan is a 72 y.o. female presenting on 11/16/2017 for Urticaria (1 month f/up) and Depression   HPI   Rash: Improved with steroids. Came back positive for RMSF, was treated with doxycycline.   Depression/Anxiety: Feels relief with Effexor. Had trouble at the pharmacy with insurance coverage for three pills of 37.5 mg to total 112.5 mg Effexor. Pharmacy suggested on 75 mg and one 37.5mg    HTN: Currently on Losartan 50 mg QD, recent blood pressures have been hypertensive.   BP Readings from Last 3 Encounters:  11/16/17 (!) 155/98  10/22/17 (!) 144/88  10/12/17 (!) 154/91     Social History   Tobacco Use  . Smoking status: Current Every Day Smoker    Packs/day: 0.50    Types: Cigarettes  . Smokeless tobacco: Never Used  Substance Use Topics  . Alcohol use: Yes    Alcohol/week: 1.8 oz    Types: 3 Glasses of wine per week  . Drug use: No    Review of Systems Per HPI unless specifically indicated above     Objective:    BP (!) 155/98   Pulse 100   Temp 98.5 F (36.9 C) (Oral)   Ht 4' 11.75" (1.518 m)   Wt 107 lb 6.4 oz (48.7 kg)   SpO2 96%   BMI 21.15 kg/m   Wt Readings from Last 3 Encounters:  11/16/17 107 lb 6.4 oz (48.7 kg)  10/22/17 106 lb 3.2 oz (48.2 kg)  10/12/17 104 lb 9.6 oz (47.4 kg)    Physical Exam  Constitutional: She is oriented to person, place, and time. She appears well-developed and well-nourished.  Cardiovascular: Normal rate and regular rhythm.  Pulmonary/Chest: Effort normal and breath sounds normal.  Neurological: She is alert and oriented to person, place, and time.  Skin: Skin is warm and dry. Rash noted. Rash is papular.  Fading papular rash on abdomen.   Psychiatric: She has a normal mood and affect. Her behavior is normal.   Results for orders placed or performed in visit on 10/12/17  Lyme Ab/Western Blot Reflex  Result  Value Ref Range   Lyme IgG/IgM Ab <0.91 0.00 - 0.90 ISR   LYME DISEASE AB, QUANT, IGM 1.93 (H) 0.00 - 0.79 index  Rocky mtn spotted fvr abs pnl(IgG+IgM)  Result Value Ref Range   RMSF IgG Negative Negative   RMSF IgM 2.50 (H) 0.00 - 0.89 index  Alpha Gal IgE  Result Value Ref Range   Alpha Gal IgE* 27.40 (H) <0.10 kU/L  Lyme, Western Blot, Serum (reflexed)  Result Value Ref Range     IgG P93 Ab. Absent      IgG P66 Ab. Absent      IgG P58 Ab. Absent      IgG P45 Ab. Absent      IgG P41 Ab. Absent      IgG P39 Ab. Absent      IgG P30 Ab. Absent      IgG P28 Ab. Absent      IgG P23 Ab. Absent      IgG P18 Ab. Absent    Lyme IgG Wb Negative      IgM P41 Ab. Absent      IgM P39 Ab. Present (A)      IgM P23 Ab. Present (A)    Lyme IgM Wb Positive (A)  Assessment & Plan:  1. Reactive depression  - venlafaxine XR (EFFEXOR XR) 75 MG 24 hr capsule; Take along with 37.5 mg effexor XR daily to total 112.5 mg daily.  Dispense: 90 capsule; Refill: 0 - venlafaxine XR (EFFEXOR XR) 37.5 MG 24 hr capsule; Take along with 75 mg effexor XR daily to total 112.5 mg daily.  Dispense: 90 capsule; Refill: 0  2. Acute anxiety  - venlafaxine XR (EFFEXOR XR) 75 MG 24 hr capsule; Take along with 37.5 mg effexor XR daily to total 112.5 mg daily.  Dispense: 90 capsule; Refill: 0 - venlafaxine XR (EFFEXOR XR) 37.5 MG 24 hr capsule; Take along with 75 mg effexor XR daily to total 112.5 mg daily.  Dispense: 90 capsule; Refill: 0  3. Rash  4. Essential hypertension  Increase losartan to 100 mg daily, recent CMP normal. F/u one month.   - losartan (COZAAR) 100 MG tablet; Take 1 tablet (100 mg total) by mouth daily.  Dispense: 90 tablet; Refill: 0    Follow up plan: Return in about 1 month (around 12/14/2017) for HTN.  Carles Collet, PA-C [facility] 11/17/2017, 1:29 PM

## 2017-12-16 ENCOUNTER — Encounter: Payer: Self-pay | Admitting: Physician Assistant

## 2017-12-16 ENCOUNTER — Ambulatory Visit: Payer: Medicare Other | Admitting: Physician Assistant

## 2017-12-16 ENCOUNTER — Telehealth: Payer: Self-pay | Admitting: Physician Assistant

## 2017-12-16 VITALS — BP 140/91 | HR 92 | Temp 98.1°F | Ht 59.5 in | Wt 105.6 lb

## 2017-12-16 DIAGNOSIS — I10 Essential (primary) hypertension: Secondary | ICD-10-CM | POA: Diagnosis not present

## 2017-12-16 DIAGNOSIS — F329 Major depressive disorder, single episode, unspecified: Secondary | ICD-10-CM

## 2017-12-16 DIAGNOSIS — M810 Age-related osteoporosis without current pathological fracture: Secondary | ICD-10-CM

## 2017-12-16 DIAGNOSIS — Z23 Encounter for immunization: Secondary | ICD-10-CM | POA: Diagnosis not present

## 2017-12-16 MED ORDER — AMLODIPINE BESYLATE 5 MG PO TABS: 5.0000 mg | ORAL_TABLET | Freq: Every day | ORAL | 0 refills | Status: DC

## 2017-12-16 MED ORDER — ALENDRONATE SODIUM 70 MG PO TABS: 70.0000 mg | ORAL_TABLET | ORAL | 0 refills | Status: DC

## 2017-12-16 NOTE — Patient Instructions (Signed)

## 2017-12-16 NOTE — Progress Notes (Signed)
Subjective:    Patient ID: Lauren Nolan, female    DOB: [birth year], 72 y.o.   MRN: [id number]  Lauren Nolan is a 72 y.o. female presenting on 12/16/2017 for Hypertension (4 week f/up)   HPI   HTN: Last visit losartan was increased from 50 mg to 100 mg daily. She is taking this without issue. Denies chest pain, Dizziness, SOB.  BP Readings from Last 3 Encounters:  12/16/17 (!) 140/91  11/16/17 (!) 155/98  10/22/17 (!) 144/88    Anxiety and Depression: Last visit effexor was increased to 112.5 mg daily. She is taking this in the morning. Does notice she is a bit sleepy. Otherwise reports positive improvements in mood. Denies headache, vomiting, dizziness.   Osteoporosis: Due for refill of Fosamax. Doing well with this, able to tolerate sitting up for 30 min and taking with full glass of water.  Social History   Tobacco Use  . Smoking status: Current Every Day Smoker    Packs/day: 0.50    Types: Cigarettes  . Smokeless tobacco: Never Used  Substance Use Topics  . Alcohol use: Yes    Alcohol/week: 3.0 standard drinks    Types: 3 Glasses of wine per week  . Drug use: No    Review of Systems Per HPI unless specifically indicated above     Objective:    BP (!) 140/91 (BP Location: Left Arm, Cuff Size: Small)   Pulse 92   Temp 98.1 F (36.7 C) (Oral)   Ht 4' 11.5" (1.511 m)   Wt 105 lb 9.6 oz (47.9 kg)   SpO2 96%   BMI 20.97 kg/m   Wt Readings from Last 3 Encounters:  12/16/17 105 lb 9.6 oz (47.9 kg)  11/16/17 107 lb 6.4 oz (48.7 kg)  10/22/17 106 lb 3.2 oz (48.2 kg)    Physical Exam  Constitutional: She is oriented to person, place, and time. She appears well-developed and well-nourished.  Cardiovascular: Normal rate and regular rhythm.  Pulmonary/Chest: Effort normal and breath sounds normal.  Musculoskeletal: She exhibits no edema.  Neurological: She is alert and oriented to person, place, and time.  Skin: Skin is warm and dry.  Psychiatric: She has a  normal mood and affect. Her behavior is normal.   Results for orders placed or performed in visit on 10/12/17  Lyme Ab/Western Blot Reflex  Result Value Ref Range   Lyme IgG/IgM Ab <0.91 0.00 - 0.90 ISR   LYME DISEASE AB, QUANT, IGM 1.93 (H) 0.00 - 0.79 index  Rocky mtn spotted fvr abs pnl(IgG+IgM)  Result Value Ref Range   RMSF IgG Negative Negative   RMSF IgM 2.50 (H) 0.00 - 0.89 index  Alpha Gal IgE  Result Value Ref Range   Alpha Gal IgE* 27.40 (H) <0.10 kU/L  Lyme, Western Blot, Serum (reflexed)  Result Value Ref Range     IgG P93 Ab. Absent      IgG P66 Ab. Absent      IgG P58 Ab. Absent      IgG P45 Ab. Absent      IgG P41 Ab. Absent      IgG P39 Ab. Absent      IgG P30 Ab. Absent      IgG P28 Ab. Absent      IgG P23 Ab. Absent      IgG P18 Ab. Absent    Lyme IgG Wb Negative      IgM P41 Ab. Absent  IgM P39 Ab. Present (A)      IgM P23 Ab. Present (A)    Lyme IgM Wb Positive (A)       Assessment & Plan:  1. Essential hypertension  Add amlodipine, counseled on pedal edema. Continue Losartan 100 mg daily.   - amLODipine (NORVASC) 5 MG tablet; Take 1 tablet (5 mg total) by mouth daily.  Dispense: 90 tablet; Refill: 0  2. Age-related osteoporosis without current pathological fracture  Fosamax refilled.   3. Reactive depression  Continue current dose of effexor 112.5 mg daily.   4. Need for influenza vaccination  - Flu vaccine HIGH DOSE PF    Follow up plan: Return in about 1 month (around 01/15/2018) for htn.  Carles Collet, PA-C [facility] 12/16/2017, 12:35 PM

## 2017-12-16 NOTE — Telephone Encounter (Signed)
Patient was seen today but forgot to mention she needs a refill on her Fosamax sent to [facility]  Thank you

## 2017-12-17 ENCOUNTER — Ambulatory Visit: Payer: Medicare Other | Admitting: Physician Assistant

## 2017-12-18 ENCOUNTER — Ambulatory Visit: Payer: Medicare Other | Admitting: Unknown Physician Specialty

## 2018-01-06 ENCOUNTER — Ambulatory Visit: Payer: Medicare Other | Admitting: Physician Assistant

## 2018-01-06 ENCOUNTER — Encounter: Payer: Self-pay | Admitting: Physician Assistant

## 2018-01-06 VITALS — BP 131/84 | HR 94 | Temp 98.7°F | Ht 59.5 in | Wt 105.4 lb

## 2018-01-06 DIAGNOSIS — I10 Essential (primary) hypertension: Secondary | ICD-10-CM | POA: Diagnosis not present

## 2018-01-06 DIAGNOSIS — F329 Major depressive disorder, single episode, unspecified: Secondary | ICD-10-CM

## 2018-01-06 DIAGNOSIS — J441 Chronic obstructive pulmonary disease with (acute) exacerbation: Secondary | ICD-10-CM

## 2018-01-06 MED ORDER — PREDNISONE 20 MG PO TABS: 20.0000 mg | ORAL_TABLET | Freq: Every day | ORAL | 0 refills | Status: AC

## 2018-01-06 MED ORDER — DOXYCYCLINE HYCLATE 100 MG PO TABS: 100.0000 mg | ORAL_TABLET | Freq: Two times a day (BID) | ORAL | 0 refills | Status: AC

## 2018-01-06 NOTE — Progress Notes (Signed)
Subjective:    Patient ID: Lauren Nolan, female    DOB: Sep 09, 1945, 72 y.o.   MRN: [id number]  Lauren Nolan is a 72 y.o. female presenting on 01/06/2018 for URI (pt states she has had sinus pressure, congestion and a productive cough for about a week)   HPI   Current every day smoker, 1/2 pack per day. Hx of mild COPD. Reports one week of URI symptoms with increased SOB and productive cough of green sputum. Has been having to use her albuterol inhaler more often, which has been minimally helpful. She is not on a maintenance inhaler.   HTN: Improved since last visit with addition of amlodipine. Currently on losartan 100 mg and amlodipine 5 mg QD. Taking both without issue. Denies chest pain or SOB.   BP Readings from Last 3 Encounters:  01/06/18 131/84  12/16/17 (!) 140/91  11/16/17 (!) 155/98   Depression: Currently taking 112.5 mg Effexor ER daily, doing well.    Social History   Tobacco Use  . Smoking status: Current Every Day Smoker    Packs/day: 0.50    Types: Cigarettes  . Smokeless tobacco: Never Used  Substance Use Topics  . Alcohol use: Yes    Alcohol/week: 3.0 standard drinks    Types: 3 Glasses of wine per week  . Drug use: No    Review of Systems Per HPI unless specifically indicated above     Objective:    BP 131/84   Pulse 94   Temp 98.7 F (37.1 C) (Oral)   Ht 4' 11.5" (1.511 m)   Wt 105 lb 6.4 oz (47.8 kg)   SpO2 93%   BMI 20.93 kg/m   Wt Readings from Last 3 Encounters:  01/06/18 105 lb 6.4 oz (47.8 kg)  12/16/17 105 lb 9.6 oz (47.9 kg)  11/16/17 107 lb 6.4 oz (48.7 kg)    Physical Exam  Constitutional: She is oriented to person, place, and time. She appears well-developed and well-nourished.  Cardiovascular: Normal rate and regular rhythm.  Pulmonary/Chest: Effort normal. No respiratory distress. She has no wheezes.  Neurological: She is alert and oriented to person, place, and time.  Skin: Skin is warm and dry.  Psychiatric: She has  a normal mood and affect. Her behavior is normal.   Results for orders placed or performed in visit on 10/12/17  Lyme Ab/Western Blot Reflex  Result Value Ref Range   Lyme IgG/IgM Ab <0.91 0.00 - 0.90 ISR   LYME DISEASE AB, QUANT, IGM 1.93 (H) 0.00 - 0.79 index  Rocky mtn spotted fvr abs pnl(IgG+IgM)  Result Value Ref Range   RMSF IgG Negative Negative   RMSF IgM 2.50 (H) 0.00 - 0.89 index  Alpha Gal IgE  Result Value Ref Range   Alpha Gal IgE* 27.40 (H) <0.10 kU/L  Lyme, Western Blot, Serum (reflexed)  Result Value Ref Range     IgG P93 Ab. Absent      IgG P66 Ab. Absent      IgG P58 Ab. Absent      IgG P45 Ab. Absent      IgG P41 Ab. Absent      IgG P39 Ab. Absent      IgG P30 Ab. Absent      IgG P28 Ab. Absent      IgG P23 Ab. Absent      IgG P18 Ab. Absent    Lyme IgG Wb Negative      IgM P41 Ab. Absent  IgM P39 Ab. Present (A)      IgM P23 Ab. Present (A)    Lyme IgM Wb Positive (A)       Assessment & Plan:  1. COPD exacerbation (HCC)   - doxycycline (VIBRA-TABS) 100 MG tablet; Take 1 tablet (100 mg total) by mouth 2 (two) times daily for 7 days.  Dispense: 14 tablet; Refill: 0 - predniSONE (DELTASONE) 20 MG tablet; Take 1 tablet (20 mg total) by mouth daily with breakfast for 5 days.  Dispense: 5 tablet; Refill: 0  2. HTN  Improved, continue losartan 100 mg and amlodipine 5 mg daily.  3. Depression  Stable on 112.5mg  Effexor. Continue.    Follow up plan: Return in about 6 months (around 07/07/2018) for HTN, depression .   Carles Collet, PA-C [facility] 01/06/2018, 10:08 AM

## 2018-01-06 NOTE — Patient Instructions (Signed)
Chronic Obstructive Pulmonary Disease Exacerbation  Chronic obstructive pulmonary disease (COPD) is a common lung problem. In COPD, the flow of air from the lungs is limited. COPD exacerbations are times that breathing gets worse and you need extra treatment. Without treatment they can be life threatening. If they happen often, your lungs can become more damaged. If your COPD gets worse, your doctor may treat you with:  ? Medicines.  ? Oxygen.  ? Different ways to clear your airway, such as using a mask.    Follow these instructions at home:  ? Do not smoke.  ? Avoid tobacco smoke and other things that bother your lungs.  ? If given, take your antibiotic medicine as told. Finish the medicine even if you start to feel better.  ? Only take medicines as told by your doctor.  ? Drink enough fluids to keep your pee (urine) clear or pale yellow (unless your doctor has told you not to).  ? Use a cool mist machine (vaporizer).  ? If you use oxygen or a machine that turns liquid medicine into a mist (nebulizer), continue to use them as told.  ? Keep up with shots (vaccinations) as told by your doctor.  ? Exercise regularly.  ? Eat healthy foods.  ? Keep all doctor visits as told.  Get help right away if:  ? You are very short of breath and it gets worse.  ? You have trouble talking.  ? You have bad chest pain.  ? You have blood in your spit (sputum).  ? You have a fever.  ? You keep throwing up (vomiting).  ? You feel weak, or you pass out (faint).  ? You feel confused.  ? You keep getting worse.  This information is not intended to replace advice given to you by your health care provider. Make sure you discuss any questions you have with your health care provider.  Document Released: 03/20/2011 Document Revised: 09/06/2015 Document Reviewed: 12/03/2012  Elsevier Interactive Patient Education ? 2017 [employer]vier Inc[employer]

## 2018-01-20 ENCOUNTER — Ambulatory Visit: Payer: Medicare Other | Admitting: Nurse Practitioner

## 2018-02-04 ENCOUNTER — Other Ambulatory Visit: Payer: Self-pay | Admitting: Unknown Physician Specialty

## 2018-02-04 NOTE — Telephone Encounter (Signed)
Has appt with Cannady 07/08/18 was pt of [place]. Requested Prescriptions  Pending Prescriptions Disp Refills  . omeprazole (PRILOSEC) 20 MG capsule [Pharmacy Med Name: OMEPRAZOLE DR 20 MG CAPSULE] 90 capsule 0    Sig: Take 1 capsule (20 mg total) by mouth daily.     Gastroenterology: Proton Pump Inhibitors Passed - 02/04/2018  3:07 PM      Passed - Valid encounter within last 12 months    Recent Outpatient Visits          4 weeks ago COPD exacerbation [facility])   [facility] Trinna Post, PA-C   1 month ago Need for influenza vaccination   [facility] Carles Collet M, [place]   2 months ago Reactive depression   [place] [place], [place]   3 months ago [place], [place], [place]   3 months ago Urticaria   [facility] Kathrine Haddock, NP      Future Appointments            In 5 months Cannady, Barbaraann Faster, NP [employer], [facility]

## 2018-02-10 ENCOUNTER — Ambulatory Visit: Payer: Self-pay | Admitting: *Deleted

## 2018-02-10 NOTE — Telephone Encounter (Signed)
Patient has soreness at the base of her neck, posteriorly on/ff, has worsened today after standing on her feet for 4-5 hours.She also has a marble-sized knot on the right side of her neck which she noticed 2-3 days ago. The knot can be tender to touch but not always.  There is no rash, bite mark or redness near the knot. Slightly warm to touch. Denies recent fever/headache/illness except for +Rocky mtn spotted fever on 11/16/17 per visit note. Denies rash and is able to put chin to chest. Advice care reviewed per protocol. She has an appointment already on 02/15/18.  Reason for Disposition . [1] MODERATE neck pain (e.g., interferes with normal activities AND [2] present > 3 days  Answer Assessment - Initial Assessment Questions 1. ONSET: "When did the pain begin?"      About a 1 1/2 weeks ago. 2. LOCATION: "Where does it hurt?"      Back of neck at the base is sore and a knot on the right side of neck. 3. PATTERN "Does the pain come and go, or has it been constant since it started?"      Comes and goes. 4. SEVERITY: "How bad is the pain?"  (Scale 1-10; or mild, moderate, severe)   - MILD (1-3): doesn't interfere with normal activities    - MODERATE (4-7): interferes with normal activities or awakens from sleep    - SEVERE (8-10):  excruciating pain, unable to do any normal activities      Knot on right side is #8 5. RADIATION: "Does the pain go anywhere else, shoot into your arms?"   no 6. CORD SYMPTOMS: "Any weakness or numbness of the arms or legs?"     no 7. CAUSE: "What do you think is causing the neck pain?"     No idea 8. NECK OVERUSE: "Any recent activities that involved turning or twisting the neck?"     Yes but she cannot explain. 9. OTHER SYMPTOMS: "Do you have any other symptoms?" (e.g., headache, fever, chest pain, difficulty breathing, neck swelling)     Knot on the right side of neck hurts sometimes with movement. 10. PREGNANCY: "Is there any chance you are pregnant?" "When was  your last menstrual period?"       no  Protocols used: NECK PAIN OR STIFFNESS-A-AH

## 2018-02-15 ENCOUNTER — Ambulatory Visit: Payer: Medicare Other | Admitting: Nurse Practitioner

## 2018-02-15 ENCOUNTER — Encounter: Payer: Self-pay | Admitting: Nurse Practitioner

## 2018-02-15 VITALS — BP 128/81 | HR 92 | Temp 98.3°F | Wt 107.2 lb

## 2018-02-15 DIAGNOSIS — M542 Cervicalgia: Secondary | ICD-10-CM | POA: Diagnosis not present

## 2018-02-15 DIAGNOSIS — M810 Age-related osteoporosis without current pathological fracture: Secondary | ICD-10-CM

## 2018-02-15 DIAGNOSIS — F329 Major depressive disorder, single episode, unspecified: Secondary | ICD-10-CM | POA: Diagnosis not present

## 2018-02-15 DIAGNOSIS — F411 Generalized anxiety disorder: Secondary | ICD-10-CM

## 2018-02-15 DIAGNOSIS — I10 Essential (primary) hypertension: Secondary | ICD-10-CM | POA: Diagnosis not present

## 2018-02-15 MED ORDER — AMLODIPINE BESYLATE 5 MG PO TABS: 5.0000 mg | ORAL_TABLET | Freq: Every day | ORAL | 3 refills | Status: DC

## 2018-02-15 MED ORDER — VENLAFAXINE HCL ER 37.5 MG PO CP24: ORAL_CAPSULE | ORAL | 3 refills | Status: DC

## 2018-02-15 MED ORDER — LOSARTAN POTASSIUM 100 MG PO TABS: 100.0000 mg | ORAL_TABLET | Freq: Every day | ORAL | 3 refills | Status: DC

## 2018-02-15 MED ORDER — ALENDRONATE SODIUM 70 MG PO TABS: 70.0000 mg | ORAL_TABLET | ORAL | 3 refills | Status: DC

## 2018-02-15 MED ORDER — VENLAFAXINE HCL ER 75 MG PO CP24: ORAL_CAPSULE | ORAL | 3 refills | Status: DC

## 2018-02-15 NOTE — Assessment & Plan Note (Addendum)
Well-controlled.  Continue Effexor.  Refills sent.

## 2018-02-15 NOTE — Progress Notes (Signed)
BP 128/81 (BP Location: Left Arm, Cuff Size: Normal)   Pulse 92   Temp 98.3 F (36.8 C) (Oral)   Wt 107 lb 3.2 oz (48.6 kg)   SpO2 95%   BMI 21.29 kg/m    Subjective:    Patient ID: Lauren Nolan, female    DOB: [birth year], 72 y.o.   MRN: [id number]  HPI: MALEA SWILLING is a 72 y.o. female presents for neck pain, HTN, and depression  Chief Complaint  Patient presents with  . Neck Pain    pt states her neck has been sore for the past 2 weeks  . Hypertension  . Depression    pt states she needs refills on her Venlafaxine prescriptions    NECK PAIN: Reports this is improving with APAP, Ibuprofen, and heat. Diagnosis: Has dx of spinal stenosis in chart on review and h/o shoulder pain Status: new onset x 2 1/2 wees ago , noted more to right side.  Initially she had a pain and then she rubbed it "real hard" and then it began hurting more.  She reports initially she had woke-up and the pillow had been under the top of her head and not supporting neck, which she reports may have been aggravating factor.  Has new pillow now and reports pain has been improving, but still wanted to get it looked at. Treatments attempted: heat, APAP and ibuprofen  Compliant with recommended treatment: yes Relief with NSAIDs?:  moderate Location:Right Duration:weeks Severity: 8/10 and currently with pain improving states 3/10 (only sore to touch) Quality: dull and aching Frequency: intermittent Radiation: radiated to top of scalp "a little" Aggravating factors: movement Alleviating factors: heat, NSAIDs and APAP Weakness:  no Paresthesias / decreased sensation:  no  Fevers:  no  HYPERTENSION Hypertension status: controlled  Satisfied with current treatment? yes Duration of hypertension: chronic BP monitoring frequency:  Does not check, encouraged her to check a few times a week BP range: rarely checks and does not recall BP medication side effects:  no Medication compliance: excellent  compliance Previous BP meds:none Aspirin: no Recurrent headaches: no Visual changes: no Palpitations: no Dyspnea: no Chest pain: no Lower extremity edema: no Dizzy/lightheaded: no   DEPRESSION/ANXIETY Mood status: controlled Satisfied with current treatment?: yes Symptom severity: mild  Duration of current treatment : chronic Side effects: no Medication compliance: excellent compliance Psychotherapy/counseling: no none Previous psychiatric medications: none Depressed mood: no Anxious mood: no Anhedonia: no Significant weight loss or gain: no Insomnia: no sleeps well per her report Fatigue: no Feelings of worthlessness or guilt: no Impaired concentration/indecisiveness: no Suicidal ideations: no Hopelessness: no Crying spells: no Depression screen [facility] 2/9 02/15/2018 10/12/2017 06/18/2017 05/07/2016 05/07/2015  Decreased Interest 0 0 0 0 0  Down, Depressed, Hopeless 0 0 0 0 0  PHQ - 2 Score 0 0 0 0 0  Altered sleeping 0 0 0 0 -  Tired, decreased energy 0 0 0 (No Data) -  Change in appetite 0 0 0 0 -  Feeling bad or failure about yourself  0 0 0 0 -  Trouble concentrating 0 0 0 0 -  Moving slowly or fidgety/restless 0 0 0 0 -  Suicidal thoughts 0 0 0 0 -  PHQ-9 Score 0 0 0 0 -    GAD 7 : Generalized Anxiety Score 02/15/2018  Nervous, Anxious, on Edge 1  Control/stop worrying 1  Worry too much - different things 0  Trouble relaxing 0  Restless 0  Easily annoyed  or irritable 0  Afraid - awful might happen 0  Total GAD 7 Score 2    Relevant past medical, surgical, family and social history reviewed and updated as indicated. Interim medical history since our last visit reviewed. Allergies and medications reviewed and updated.  Review of Systems  Constitutional: Negative for activity change, chills, diaphoresis, fatigue and fever.  Respiratory: Negative for cough, chest tightness, shortness of breath and wheezing.   Cardiovascular: Negative for chest pain, palpitations  and leg swelling.  Gastrointestinal: Negative for abdominal distention, abdominal pain, constipation, diarrhea, nausea and vomiting.  Endocrine: Negative for cold intolerance, heat intolerance, polydipsia, polyphagia and polyuria.  Musculoskeletal: Positive for neck pain. Negative for back pain, joint swelling and neck stiffness.  Skin: Negative.   Neurological: Negative for dizziness, tremors, syncope, weakness, light-headedness, numbness and headaches.  Psychiatric/Behavioral: Negative for behavioral problems, decreased concentration, sleep disturbance and suicidal ideas. The patient is not nervous/anxious.     Per HPI unless specifically indicated above     Objective:    BP 128/81 (BP Location: Left Arm, Cuff Size: Normal)   Pulse 92   Temp 98.3 F (36.8 C) (Oral)   Wt 107 lb 3.2 oz (48.6 kg)   SpO2 95%   BMI 21.29 kg/m   Wt Readings from Last 3 Encounters:  02/15/18 107 lb 3.2 oz (48.6 kg)  01/06/18 105 lb 6.4 oz (47.8 kg)  12/16/17 105 lb 9.6 oz (47.9 kg)    Physical Exam  Constitutional: She is oriented to person, place, and time. She appears well-developed and well-nourished.  HENT:  Head: Normocephalic and atraumatic.  Eyes: Pupils are equal, round, and reactive to light. Conjunctivae and EOM are normal. Right eye exhibits no discharge. Left eye exhibits no discharge.  Neck: Normal range of motion. Neck supple. No JVD present. Muscular tenderness present. Carotid bruit is not present. No neck rigidity. No edema, no erythema and normal range of motion present. No thyromegaly present.  Mild reported muscular tenderness to right side, but no grimacing or guarding to area.  Cardiovascular: Normal rate, regular rhythm, normal heart sounds and intact distal pulses.  Pulmonary/Chest: Effort normal and breath sounds normal.  Abdominal: Soft. Bowel sounds are normal. There is no splenomegaly or hepatomegaly.  Musculoskeletal: Normal range of motion.  Lymphadenopathy:    She has  no cervical adenopathy.  Neurological: She is alert and oriented to person, place, and time. She has normal reflexes.  Skin: Skin is warm and dry.  Psychiatric: She has a normal mood and affect. Her behavior is normal.    Results for orders placed or performed in visit on 10/12/17  Lyme Ab/Western Blot Reflex  Result Value Ref Range   Lyme IgG/IgM Ab <0.91 0.00 - 0.90 ISR   LYME DISEASE AB, QUANT, IGM 1.93 (H) 0.00 - 0.79 index  Rocky mtn spotted fvr abs pnl(IgG+IgM)  Result Value Ref Range   RMSF IgG Negative Negative   RMSF IgM 2.50 (H) 0.00 - 0.89 index  Alpha Gal IgE  Result Value Ref Range   Alpha Gal IgE* 27.40 (H) <0.10 kU/L  Lyme, Western Blot, Serum (reflexed)  Result Value Ref Range     IgG P93 Ab. Absent      IgG P66 Ab. Absent      IgG P58 Ab. Absent      IgG P45 Ab. Absent      IgG P41 Ab. Absent      IgG P39 Ab. Absent      IgG P30  Ab. Absent      IgG P28 Ab. Absent      IgG P23 Ab. Absent      IgG P18 Ab. Absent    Lyme IgG Wb Negative      IgM P41 Ab. Absent      IgM P39 Ab. Present (A)      IgM P23 Ab. Present (A)    Lyme IgM Wb Positive (A)       Assessment & Plan:   Problem List Items Addressed This Visit      Cardiovascular and Mediastinum   Hypertension    Chronic, stable.  BP at goal on exam today.  Encouraged to monitor BP at home.  Continue current medication regimen.  Refills sent.      Relevant Medications   losartan (COZAAR) 100 MG tablet   amLODipine (NORVASC) 5 MG tablet     Musculoskeletal and Integument   Osteoporosis   Relevant Medications   alendronate (FOSAMAX) 70 MG tablet     Other   Generalized anxiety disorder    Chronic, improved with Effexor at current dose.  Continue current regimen.  Refills sent.      Relevant Medications   venlafaxine XR (EFFEXOR XR) 75 MG 24 hr capsule   venlafaxine XR (EFFEXOR XR) 37.5 MG 24 hr capsule   Depression    Well-controlled.  Continue Effexor.  Refills sent.      Relevant  Medications   venlafaxine XR (EFFEXOR XR) 75 MG 24 hr capsule   venlafaxine XR (EFFEXOR XR) 37.5 MG 24 hr capsule   Neck pain - Primary    Acute and improving with low intervention.  Continue to use PRN Tylenol and Ibuprofen (discussed max doses to use) + heat.  Return if worsening symptoms present.          Follow up plan: Return if symptoms worsen or fail to improve.

## 2018-02-15 NOTE — Assessment & Plan Note (Signed)
Chronic, improved with Effexor at current dose.  Continue current regimen.  Refills sent.

## 2018-02-15 NOTE — Patient Instructions (Signed)
Cervical Radiculopathy  Cervical radiculopathy means that a nerve in the neck is pinched or bruised. This can cause pain or loss of feeling (numbness) that runs from your neck to your arm and fingers.  Follow these instructions at home:  Managing pain  ? Take over-the-counter and prescription medicines only as told by your doctor.  ? If directed, put ice on the injured or painful area.  ? Put ice in a plastic bag.  ? Place a towel between your skin and the bag.  ? Leave the ice on for 20 minutes, 2?3 times per day.  ? If ice does not help, you can try using heat. Take a warm shower or warm bath, or use a heat pack as told by your doctor.  ? You may try a gentle neck and shoulder massage.  Activity  ? Rest as needed. Follow instructions from your doctor about any activities to avoid.  ? Do exercises as told by your doctor or physical therapist.  General instructions  ? If you were given a soft collar, wear it as told by your doctor.  ? Use a flat pillow when you sleep.  ? Keep all follow-up visits as told by your doctor. This is important.  Contact a doctor if:  ? Your condition does not improve with treatment.  Get help right away if:  ? Your pain gets worse and is not controlled with medicine.  ? You lose feeling or feel weak in your hand, arm, face, or leg.  ? You have a fever.  ? You have a stiff neck.  ? You cannot control when you poop or pee (have incontinence).  ? You have trouble with walking, balance, or talking.  This information is not intended to replace advice given to you by your health care provider. Make sure you discuss any questions you have with your health care provider.  Document Released: 03/20/2011 Document Revised: 09/06/2015 Document Reviewed: 05/25/2014  Elsevier Interactive Patient Education ? 2018 [employer]vier Inc[employer]

## 2018-02-15 NOTE — Assessment & Plan Note (Signed)
Acute and improving with low intervention.  Continue to use PRN Tylenol and Ibuprofen (discussed max doses to use) + heat.  Return if worsening symptoms present.

## 2018-02-15 NOTE — Assessment & Plan Note (Signed)
Chronic, stable.  BP at goal on exam today.  Encouraged to monitor BP at home.  Continue current medication regimen.  Refills sent.

## 2018-03-15 ENCOUNTER — Other Ambulatory Visit: Payer: Self-pay | Admitting: Unknown Physician Specialty

## 2018-03-15 ENCOUNTER — Other Ambulatory Visit: Payer: Self-pay | Admitting: Family Medicine

## 2018-03-16 NOTE — Telephone Encounter (Signed)
Requested Prescriptions  Pending Prescriptions Disp Refills  . losartan (COZAAR) 50 MG tablet [Pharmacy Med Name: LOSARTAN POTASSIUM 50 MG TAB] 90 tablet 0    Sig: Take 1 tablet (50 mg total) by mouth daily.     Cardiovascular:  Angiotensin Receptor Blockers Failed - 03/15/2018 10:21 AM      Failed - Cr in normal range and within 180 days    Creatinine, Ser  Date Value Ref Range Status  06/18/2017 0.66 0.57 - 1.00 mg/dL Final         Failed - K in normal range and within 180 days    Potassium  Date Value Ref Range Status  06/18/2017 4.3 3.5 - 5.2 mmol/L Final         Passed - Patient is not pregnant      Passed - Last BP in normal range    BP Readings from Last 1 Encounters:  02/15/18 128/81         Passed - Valid encounter within last 6 months    Recent Outpatient Visits          4 weeks ago Neck pain   [place], Henrine Screws T, NP   2 months ago COPD exacerbation [facility])   [place], [place], PA-C   3 months ago Need for influenza vaccination   [facility] Carles Collet M, [place]   4 months ago Reactive depression   [place], [place], [place]   4 months ago [place], Lilia Argue, [place]      Future Appointments            In 3 months [place], Barbaraann Faster, NP [employer]

## 2018-03-16 NOTE — Telephone Encounter (Signed)
Medication filled on 03/16/18

## 2018-04-26 ENCOUNTER — Encounter: Payer: Self-pay | Admitting: Nurse Practitioner

## 2018-04-26 ENCOUNTER — Other Ambulatory Visit: Payer: Self-pay

## 2018-04-26 ENCOUNTER — Ambulatory Visit: Payer: Medicare Other | Admitting: Nurse Practitioner

## 2018-04-26 VITALS — BP 125/84 | HR 88 | Temp 98.5°F | Ht 61.0 in | Wt 104.0 lb

## 2018-04-26 DIAGNOSIS — J0100 Acute maxillary sinusitis, unspecified: Secondary | ICD-10-CM | POA: Diagnosis not present

## 2018-04-26 DIAGNOSIS — J069 Acute upper respiratory infection, unspecified: Secondary | ICD-10-CM | POA: Diagnosis not present

## 2018-04-26 DIAGNOSIS — F329 Major depressive disorder, single episode, unspecified: Secondary | ICD-10-CM | POA: Diagnosis not present

## 2018-04-26 LAB — VERITOR FLU A/B WAIVED
INFLUENZA A: NEGATIVE
Influenza B: NEGATIVE

## 2018-04-26 MED ORDER — DOXYCYCLINE HYCLATE 100 MG PO TABS: 100.0000 mg | ORAL_TABLET | Freq: Two times a day (BID) | ORAL | 0 refills | Status: AC

## 2018-04-26 NOTE — Assessment & Plan Note (Addendum)
Acute with worsening symptoms and fever.  Flu testing negative.  Script for Doxycycline sent.  Continue to use Flonase at home.  Recommend use of Tylenol for fever and daily Claritin or Zyrtec until symptoms improved.  Sinus rinses and humidifier at home.  Return for worsening or continued symptoms.

## 2018-04-26 NOTE — Patient Instructions (Signed)
Miralax once a day or Docusate Sodium (Colace) 100 MG twice a day.  Constipation, Adult Constipation is when a person:  Poops (has a bowel movement) fewer times in a week than normal.  Has a hard time pooping.  Has poop that is dry, hard, or bigger than normal. Follow these instructions at home: Eating and drinking   Eat foods that have a lot of fiber, such as: ? Fresh fruits and vegetables. ? Whole grains. ? Beans.  Eat less of foods that are high in fat, low in fiber, or overly processed, such as: ? [place] fries. ? Hamburgers. ? Cookies. ? Candy. ? Soda.  Drink enough fluid to keep your pee (urine) clear or pale yellow. General instructions  Exercise regularly or as told by your doctor.  Go to the restroom when you feel like you need to poop. Do not hold it in.  Take over-the-counter and prescription medicines only as told by your doctor. These include any fiber supplements.  Do pelvic floor retraining exercises, such as: ? Doing deep breathing while relaxing your lower belly (abdomen). ? Relaxing your pelvic floor while pooping.  Watch your condition for any changes.  Keep all follow-up visits as told by your doctor. This is important. Contact a doctor if:  You have pain that gets worse.  You have a fever.  You have not pooped for 4 days.  You throw up (vomit).  You are not hungry.  You lose weight.  You are bleeding from the anus.  You have thin, pencil-like poop (stool). Get help right away if:  You have a fever, and your symptoms suddenly get worse.  You leak poop or have blood in your poop.  Your belly feels hard or bigger than normal (is bloated).  You have very bad belly pain.  You feel dizzy or you faint. This information is not intended to replace advice given to you by your health care provider. Make sure you discuss any questions you have with your health care provider. Document Released: 09/17/2007 Document Revised: 10/19/2015  Document Reviewed: 09/19/2015 Elsevier Interactive Patient Education  2019 [employer].

## 2018-04-26 NOTE — Progress Notes (Addendum)
BP 125/84   Pulse 88 Comment: apical  Temp 98.5 F (36.9 C) (Oral)   Ht 5\' 1"  (1.549 m)   Wt 104 lb (47.2 kg)   SpO2 95%   BMI 19.65 kg/m    Subjective:    Patient ID: Lauren Nolan, female    DOB: 03/22/1946, 73 y.o.   MRN: [id number]  HPI: Lauren Nolan is a 73 y.o. female presents for acute visit  Chief Complaint  Patient presents with  . Cough    x 4 days/ dry and productive cough  . Headache    pt states pressure behind her ayes  . Fever    low grade  . Other    pt would like to know if she can switch the effexor for something else. pt states that effexor or some other medication might be causing her being constipated especially in the mornings   UPPER RESPIRATORY TRACT INFECTION Had cough and sore throat that started 4 days ago and has become worse.  Last abx use Doxycycline in September 2019. Worst symptom: headache and productive cough Fever: yes Cough: yes Shortness of breath: no Wheezing: yes Chest pain: no Chest tightness: yes Chest congestion: yes Nasal congestion: yes Runny nose: yes Post nasal drip: yes Sneezing: yes Sore throat: no Swollen glands: no Sinus pressure: yes Headache: yes Face pain: yes Toothache: no Ear pain: none Ear pressure: yes bilateral Eyes red/itching:no Eye drainage/crusting: no  Vomiting: no Rash: no Fatigue: yes Sick contacts: yes Strep contacts: no  Context: fluctuating Recurrent sinusitis: no Relief with OTC cold/cough medications: no  Treatments attempted: BCA and cold/sinus   DEPRESSION She is inquiring into constipation with Effexor, wondering if she should try something else. She is on Fosamax for bone health.  Discussed use of Miralax or Colace daily for bowel health and regimen. Mood status: controlled Satisfied with current treatment?: yes Symptom severity: mild  Duration of current treatment : chronic Side effects: no Medication compliance: good compliance Psychotherapy/counseling:  none Depressed mood: no Anxious mood: no Anhedonia: no Significant weight loss or gain: no Insomnia: none Fatigue: no Feelings of worthlessness or guilt: no Impaired concentration/indecisiveness: no Suicidal ideations: no Hopelessness: no Crying spells: no Depression screen [facility] 2/9 04/26/2018 02/15/2018 10/12/2017 06/18/2017 05/07/2016  Decreased Interest 0 0 0 0 0  Down, Depressed, Hopeless 0 0 0 0 0  PHQ - 2 Score 0 0 0 0 0  Altered sleeping 0 0 0 0 0  Tired, decreased energy 0 0 0 0 (No Data)  Change in appetite 0 0 0 0 0  Feeling bad or failure about yourself  1 0 0 0 0  Trouble concentrating 0 0 0 0 0  Moving slowly or fidgety/restless 0 0 0 0 0  Suicidal thoughts 0 0 0 0 0  PHQ-9 Score 1 0 0 0 0  Difficult doing work/chores Not difficult at all - - - -   GAD 7 : Generalized Anxiety Score 04/26/2018 02/15/2018  Nervous, Anxious, on Edge 1 1  Control/stop worrying 0 1  Worry too much - different things 0 0  Trouble relaxing 0 0  Restless 0 0  Easily annoyed or irritable 0 0  Afraid - awful might happen 0 0  Total GAD 7 Score 1 2  Anxiety Difficulty Not difficult at all -     Relevant past medical, surgical, family and social history reviewed and updated as indicated. Interim medical history since our last visit reviewed. Allergies and medications reviewed  and updated.  Review of Systems  Constitutional: Positive for fatigue and fever. Negative for activity change, appetite change and diaphoresis.  HENT: Positive for congestion, postnasal drip, rhinorrhea, sinus pressure, sinus pain and sneezing. Negative for ear discharge, ear pain, facial swelling, sore throat and voice change.   Eyes: Negative for pain and visual disturbance.  Respiratory: Positive for cough and chest tightness. Negative for shortness of breath and wheezing.   Cardiovascular: Negative for chest pain, palpitations and leg swelling.  Gastrointestinal: Positive for constipation (occasionally). Negative for  abdominal distention, abdominal pain, diarrhea, nausea and vomiting.  Endocrine: Negative.   Musculoskeletal: Positive for myalgias.  Neurological: Negative for dizziness, numbness and headaches.  Psychiatric/Behavioral: Negative.     Per HPI unless specifically indicated above     Objective:    BP 125/84   Pulse 88 Comment: apical  Temp 98.5 F (36.9 C) (Oral)   Ht 5\' 1"  (1.549 m)   Wt 104 lb (47.2 kg)   SpO2 95%   BMI 19.65 kg/m   Wt Readings from Last 3 Encounters:  04/26/18 104 lb (47.2 kg)  02/15/18 107 lb 3.2 oz (48.6 kg)  01/06/18 105 lb 6.4 oz (47.8 kg)    Physical Exam Vitals signs and nursing note reviewed.  Constitutional:      General: She is awake.     Appearance: She is well-developed. She is not ill-appearing.  HENT:     Head: Normocephalic. No raccoon eyes.     Right Ear: Hearing, ear canal and external ear normal. A middle ear effusion is present.     Left Ear: Hearing, ear canal and external ear normal. A middle ear effusion is present.     Nose: Mucosal edema and rhinorrhea present. Rhinorrhea is purulent.     Right Sinus: Maxillary sinus tenderness present. No frontal sinus tenderness.     Left Sinus: Maxillary sinus tenderness present. No frontal sinus tenderness.     Mouth/Throat:     Lips: Pink.     Mouth: Mucous membranes are moist.     Pharynx: Posterior oropharyngeal erythema (mild with cobblestoning) present. No pharyngeal swelling or oropharyngeal exudate.     Tonsils: Swelling: 0 on the right. 0 on the left.  Eyes:     General: Lids are normal.        Right eye: No discharge.        Left eye: No discharge.     Conjunctiva/sclera: Conjunctivae normal.     Pupils: Pupils are equal, round, and reactive to light.  Neck:     Musculoskeletal: Normal range of motion and neck supple.     Thyroid: No thyromegaly.     Vascular: No carotid bruit or JVD.  Cardiovascular:     Rate and Rhythm: Normal rate and regular rhythm.     Heart sounds:  Normal heart sounds. No murmur. No gallop.   Pulmonary:     Effort: Pulmonary effort is normal.     Breath sounds: Normal breath sounds.     Comments: Clear throughout.  Intermittent nonproductive cough. Abdominal:     General: Bowel sounds are normal.     Palpations: Abdomen is soft. There is no hepatomegaly or splenomegaly.  Musculoskeletal:     Right lower leg: No edema.     Left lower leg: No edema.  Lymphadenopathy:     Cervical: No cervical adenopathy.  Skin:    General: Skin is warm and dry.  Neurological:     Mental Status: She is  alert and oriented to person, place, and time.  Psychiatric:        Attention and Perception: Attention normal.        Mood and Affect: Mood normal.        Behavior: Behavior normal. Behavior is cooperative.        Thought Content: Thought content normal.        Judgment: Judgment normal.     Results for orders placed or performed in visit on 10/12/17  Lyme Ab/Western Blot Reflex  Result Value Ref Range   Lyme IgG/IgM Ab <0.91 0.00 - 0.90 ISR   LYME DISEASE AB, QUANT, IGM 1.93 (H) 0.00 - 0.79 index  Rocky mtn spotted fvr abs pnl(IgG+IgM)  Result Value Ref Range   RMSF IgG Negative Negative   RMSF IgM 2.50 (H) 0.00 - 0.89 index  Alpha Gal IgE  Result Value Ref Range   Alpha Gal IgE* 27.40 (H) <0.10 kU/L  Lyme, Western Blot, Serum (reflexed)  Result Value Ref Range     IgG P93 Ab. Absent      IgG P66 Ab. Absent      IgG P58 Ab. Absent      IgG P45 Ab. Absent      IgG P41 Ab. Absent      IgG P39 Ab. Absent      IgG P30 Ab. Absent      IgG P28 Ab. Absent      IgG P23 Ab. Absent      IgG P18 Ab. Absent    Lyme IgG Wb Negative      IgM P41 Ab. Absent      IgM P39 Ab. Present (A)      IgM P23 Ab. Present (A)    Lyme IgM Wb Positive (A)       Assessment & Plan:   Problem List Items Addressed This Visit      Respiratory   Sinusitis - Primary    Acute with worsening symptoms and fever.  Flu testing negative.  Script for  Doxycycline sent.  Continue to use Flonase at home.  Recommend use of Tylenol for fever and daily Claritin or Zyrtec until symptoms improved.  Sinus rinses and humidifier at home.  Return for worsening or continued symptoms.      Relevant Medications   doxycycline (VIBRA-TABS) 100 MG tablet   Other Relevant Orders   Veritor Flu A/B Waived     Other   Depression    Chronic, stable.  Continue Effexor.  Given information on medications for constipation.            Follow up plan: Return if symptoms worsen or fail to improve.

## 2018-04-26 NOTE — Assessment & Plan Note (Signed)
Chronic, stable.  Continue Effexor.  Given information on medications for constipation.

## 2018-05-14 ENCOUNTER — Other Ambulatory Visit: Payer: Self-pay

## 2018-05-14 MED ORDER — OMEPRAZOLE 20 MG PO CPDR: 20.0000 mg | DELAYED_RELEASE_CAPSULE | Freq: Every day | ORAL | 0 refills | Status: DC

## 2018-06-07 ENCOUNTER — Ambulatory Visit (INDEPENDENT_AMBULATORY_CARE_PROVIDER_SITE_OTHER): Payer: Medicare Other

## 2018-06-07 VITALS — BP 148/86 | HR 66 | Temp 97.6°F | Resp 16 | Ht 62.0 in | Wt 104.2 lb

## 2018-06-07 DIAGNOSIS — Z0000 Encounter for general adult medical examination without abnormal findings: Secondary | ICD-10-CM | POA: Diagnosis not present

## 2018-06-07 NOTE — Patient Instructions (Addendum)
Lauren Nolan , Thank you for taking time to come for your Medicare Wellness Visit. I appreciate your ongoing commitment to your health goals. Please review the following plan we discussed and let me know if I can assist you in the future.   Screening recommendations/referrals: Colonoscopy: declined Mammogram: completed 07/10/2017 Bone Density: completed 09/06/2014 Recommended yearly ophthalmology/optometry visit for glaucoma screening and checkup Recommended yearly dental visit for hygiene and checkup  Vaccinations: Influenza vaccine: up to date  Pneumococcal vaccine: up to date  Tdap vaccine: up to date  Shingles vaccine: shingrix eligible, check with your insurance company for coverage    Advanced directives: Advance directive discussed with you today. Even though you declined this today please call our office should you change your mind and we can give you the proper paperwork for you to fill out.  Conditions/risks identified: Smoking cessation discussed  Next appointment: Follow up on 07/08/2018 at 8:30am with Jolene Cannady,NP. Follow up in one year for your annual wellness exam.    Preventive Care 65 Years and Older, Female Preventive care refers to lifestyle choices and visits with your health care provider that can promote health and wellness. What does preventive care include?  A yearly physical exam. This is also called an annual well check.  Dental exams once or twice a year.  Routine eye exams. Ask your health care provider how often you should have your eyes checked.  Personal lifestyle choices, including:  Daily care of your teeth and gums.  Regular physical activity.  Eating a healthy diet.  Avoiding tobacco and drug use.  Limiting alcohol use.  Practicing safe sex.  Taking low-dose aspirin every day.  Taking vitamin and mineral supplements as recommended by your health care provider. What happens during an annual well check? The services and screenings  done by your health care provider during your annual well check will depend on your age, overall health, lifestyle risk factors, and family history of disease. Counseling  Your health care provider may ask you questions about your:  Alcohol use.  Tobacco use.  Drug use.  Emotional well-being.  Home and relationship well-being.  Sexual activity.  Eating habits.  History of falls.  Memory and ability to understand (cognition).  Work and work Statistician.  Reproductive health. Screening  You may have the following tests or measurements:  Height, weight, and BMI.  Blood pressure.  Lipid and cholesterol levels. These may be checked every 5 years, or more frequently if you are over 65 years old.  Skin check.  Lung cancer screening. You may have this screening every year starting at age 7 if you have a 30-pack-year history of smoking and currently smoke or have quit within the past 15 years.  Fecal occult blood test (FOBT) of the stool. You may have this test every year starting at age 70.  Flexible sigmoidoscopy or colonoscopy. You may have a sigmoidoscopy every 5 years or a colonoscopy every 10 years starting at age 35.  Hepatitis C blood test.  Hepatitis B blood test.  Sexually transmitted disease (STD) testing.  Diabetes screening. This is done by checking your blood sugar (glucose) after you have not eaten for a while (fasting). You may have this done every 1-3 years.  Bone density scan. This is done to screen for osteoporosis. You may have this done starting at age 58.  Mammogram. This may be done every 1-2 years. Talk to your health care provider about how often you should have regular mammograms. Talk with  your health care provider about your test results, treatment options, and if necessary, the need for more tests. Vaccines  Your health care provider may recommend certain vaccines, such as:  Influenza vaccine. This is recommended every year.  Tetanus,  diphtheria, and acellular pertussis (Tdap, Td) vaccine. You may need a Td booster every 10 years.  Zoster vaccine. You may need this after age 53.  Pneumococcal 13-valent conjugate (PCV13) vaccine. One dose is recommended after age 67.  Pneumococcal polysaccharide (PPSV23) vaccine. One dose is recommended after age 32. Talk to your health care provider about which screenings and vaccines you need and how often you need them. This information is not intended to replace advice given to you by your health care provider. Make sure you discuss any questions you have with your health care provider. Document Released: 04/27/2015 Document Revised: 12/19/2015 Document Reviewed: 01/30/2015 Elsevier Interactive Patient Education  2017 [place] Prevention in the Home Falls can cause injuries. They can happen to people of all ages. There are many things you can do to make your home safe and to help prevent falls. What can I do on the outside of my home?  Regularly fix the edges of walkways and driveways and fix any cracks.  Remove anything that might make you trip as you walk through a door, such as a raised step or threshold.  Trim any bushes or trees on the path to your home.  Use bright outdoor lighting.  Clear any walking paths of anything that might make someone trip, such as rocks or tools.  Regularly check to see if handrails are loose or broken. Make sure that both sides of any steps have handrails.  Any raised decks and porches should have guardrails on the edges.  Have any leaves, snow, or ice cleared regularly.  Use sand or salt on walking paths during winter.  Clean up any spills in your garage right away. This includes oil or grease spills. What can I do in the bathroom?  Use night lights.  Install grab bars by the toilet and in the tub and shower. Do not use towel bars as grab bars.  Use non-skid mats or decals in the tub or shower.  If you need to sit down in  the shower, use a plastic, non-slip stool.  Keep the floor dry. Clean up any water that spills on the floor as soon as it happens.  Remove soap buildup in the tub or shower regularly.  Attach bath mats securely with double-sided non-slip rug tape.  Do not have throw rugs and other things on the floor that can make you trip. What can I do in the bedroom?  Use night lights.  Make sure that you have a light by your bed that is easy to reach.  Do not use any sheets or blankets that are too big for your bed. They should not hang down onto the floor.  Have a firm chair that has side arms. You can use this for support while you get dressed.  Do not have throw rugs and other things on the floor that can make you trip. What can I do in the kitchen?  Clean up any spills right away.  Avoid walking on wet floors.  Keep items that you use a lot in easy-to-reach places.  If you need to reach something above you, use a strong step stool that has a grab bar.  Keep electrical cords out of the way.  Do not  use floor polish or wax that makes floors slippery. If you must use wax, use non-skid floor wax.  Do not have throw rugs and other things on the floor that can make you trip. What can I do with my stairs?  Do not leave any items on the stairs.  Make sure that there are handrails on both sides of the stairs and use them. Fix handrails that are broken or loose. Make sure that handrails are as long as the stairways.  Check any carpeting to make sure that it is firmly attached to the stairs. Fix any carpet that is loose or worn.  Avoid having throw rugs at the top or bottom of the stairs. If you do have throw rugs, attach them to the floor with carpet tape.  Make sure that you have a light switch at the top of the stairs and the bottom of the stairs. If you do not have them, ask someone to add them for you. What else can I do to help prevent falls?  Wear shoes that:  Do not have high  heels.  Have rubber bottoms.  Are comfortable and fit you well.  Are closed at the toe. Do not wear sandals.  If you use a stepladder:  Make sure that it is fully opened. Do not climb a closed stepladder.  Make sure that both sides of the stepladder are locked into place.  Ask someone to hold it for you, if possible.  Clearly mark and make sure that you can see:  Any grab bars or handrails.  First and last steps.  Where the edge of each step is.  Use tools that help you move around (mobility aids) if they are needed. These include:  Canes.  Walkers.  Scooters.  Crutches.  Turn on the lights when you go into a dark area. Replace any light bulbs as soon as they burn out.  Set up your furniture so you have a clear path. Avoid moving your furniture around.  If any of your floors are uneven, fix them.  If there are any pets around you, be aware of where they are.  Review your medicines with your doctor. Some medicines can make you feel dizzy. This can increase your chance of falling. Ask your doctor what other things that you can do to help prevent falls. This information is not intended to replace advice given to you by your health care provider. Make sure you discuss any questions you have with your health care provider. Document Released: 01/25/2009 Document Revised: 09/06/2015 Document Reviewed: 05/05/2014 Elsevier Interactive Patient Education  2017 [employer].   Steps to Quit Smoking  Smoking tobacco can be bad for your health. It can also affect almost every organ in your body. Smoking puts you and people around you at risk for many serious long-lasting (chronic) diseases. Quitting smoking is hard, but it is one of the best things that you can do for your health. It is never too late to quit. What are the benefits of quitting smoking? When you quit smoking, you lower your risk for getting serious diseases and conditions. They can include:  Lung cancer or  lung disease.  Heart disease.  Stroke.  Heart attack.  Not being able to have children (infertility).  Weak bones (osteoporosis) and broken bones (fractures). If you have coughing, wheezing, and shortness of breath, those symptoms may get better when you quit. You may also get sick less often. If you are pregnant, quitting smoking can  help to lower your chances of having a baby of low birth weight. What can I do to help me quit smoking? Talk with your doctor about what can help you quit smoking. Some things you can do (strategies) include:  Quitting smoking totally, instead of slowly cutting back how much you smoke over a period of time.  Going to in-person counseling. You are more likely to quit if you go to many counseling sessions.  Using resources and support systems, such as: ? Database administrator with a Social worker. ? Phone quitlines. ? Careers information officer. ? Support groups or group counseling. ? Text messaging programs. ? Mobile phone apps or applications.  Taking medicines. Some of these medicines may have nicotine in them. If you are pregnant or breastfeeding, do not take any medicines to quit smoking unless your doctor says it is okay. Talk with your doctor about counseling or other things that can help you. Talk with your doctor about using more than one strategy at the same time, such as taking medicines while you are also going to in-person counseling. This can help make quitting easier. What things can I do to make it easier to quit? Quitting smoking might feel very hard at first, but there is a lot that you can do to make it easier. Take these steps:  Talk to your family and friends. Ask them to support and encourage you.  Call phone quitlines, reach out to support groups, or work with a Social worker.  Ask people who smoke to not smoke around you.  Avoid places that make you want (trigger) to smoke, such as: ? Bars. ? Parties. ? Smoke-break areas at work.  Spend  time with people who do not smoke.  Lower the stress in your life. Stress can make you want to smoke. Try these things to help your stress: ? Getting regular exercise. ? Deep-breathing exercises. ? Yoga. ? Meditating. ? Doing a body scan. To do this, close your eyes, focus on one area of your body at a time from head to toe, and notice which parts of your body are tense. Try to relax the muscles in those areas.  Download or buy apps on your mobile phone or tablet that can help you stick to your quit plan. There are many free apps, such as QuitGuide from the [employer] Office manager for Disease Control and Prevention). You can find more support from smokefree.gov and other websites. This information is not intended to replace advice given to you by your health care provider. Make sure you discuss any questions you have with your health care provider. Document Released: 01/25/2009 Document Revised: 11/27/2015 Document Reviewed: 08/15/2014 Elsevier Interactive Patient Education  2019 [employer].

## 2018-06-07 NOTE — Progress Notes (Signed)
Subjective:   Lauren Nolan is a 73 y.o. female who presents for Medicare Annual (Subsequent) preventive examination.  Review of Systems:   Cardiac Risk Factors include: advanced age (>72men, >62 women);hypertension;smoking/ tobacco exposure     Objective:     Vitals: BP (!) 148/86 (BP Location: Left Arm, Patient Position: Sitting, Cuff Size: Normal)   Pulse 66   Temp 97.6 F (36.4 C) (Temporal)   Resp 16   Ht 5\' 2"  (1.575 m)   Wt 104 lb 3.2 oz (47.3 kg)   BMI 19.06 kg/m   Body mass index is 19.06 kg/m.  Advanced Directives 06/07/2018 05/07/2016 05/07/2015  Does Patient Have a Medical Advance Directive? No No No  Would patient like information on creating a medical advance directive? No - Patient declined - -    Tobacco Social History   Tobacco Use  Smoking Status Current Every Day Smoker  . Packs/day: 0.25  . Types: Cigarettes  Smokeless Tobacco Never Used  Tobacco Comment   1 pack every 3 days      Ready to quit: Yes Counseling given: Yes Comment: 1 pack every 3 days    Clinical Intake:  Pre-visit preparation completed: Yes  Pain : No/denies pain     Nutritional Status: BMI of 19-24  Normal Nutritional Risks: None Diabetes: No  How often do you need to have someone help you when you read instructions, pamphlets, or other written materials from your doctor or pharmacy?: 1 - Never What is the last grade level you completed in school?: 8th grade  Interpreter Needed?: No  Information entered by :: Lauren Kuwahara,LPN   Past Medical History:  Diagnosis Date  . Allergy   . Anxiety   . COPD (chronic obstructive pulmonary disease) ([place])   . Depression   . GERD (gastroesophageal reflux disease)   . Hypertension   . Internal hemorrhoid   . Lichen sclerosus   . Osteoporosis   . Spinal stenosis   . Tobacco use    Past Surgical History:  Procedure Laterality Date  . BREAST EXCISIONAL BIOPSY Bilateral    1980's - benign  . BREAST SURGERY Bilateral      biopsy  . EYE SURGERY Bilateral Feb-Mar 2016  . HERNIA REPAIR    . TUBAL LIGATION     Family History  Problem Relation Age of Onset  . Dementia Mother   . Anxiety disorder Mother   . Hypertension Mother   . Epilepsy Mother   . Cancer Father        lung  . Diabetes Brother   . Colon cancer Brother   . Hypertension Daughter   . Heart disease Maternal Grandfather        MI  . Hypertension Brother   . Diabetes Daughter   . Arthritis Daughter   . Breast cancer Neg Hx    Social History   Socioeconomic History  . Marital status: Married    Spouse name: Not on file  . Number of children: Not on file  . Years of education: Not on file  . Highest education level: 8th grade  Occupational History  . Occupation: retired  Scientific laboratory technician  . Financial resource strain: Not hard at all  . Food insecurity:    Worry: Never true    Inability: Never true  . Transportation needs:    Medical: No    Non-medical: No  Tobacco Use  . Smoking status: Current Every Day Smoker    Packs/day: 0.25  Types: Cigarettes  . Smokeless tobacco: Never Used  . Tobacco comment: 1 pack every 3 days   Substance and Sexual Activity  . Alcohol use: Yes    Alcohol/week: 3.0 standard drinks    Types: 3 Glasses of wine per week  . Drug use: No  . Sexual activity: Yes  Lifestyle  . Physical activity:    Days per week: 0 days    Minutes per session: 0 min  . Stress: Not at all  Relationships  . Social connections:    Talks on phone: More than three times a week    Gets together: More than three times a week    Attends religious service: More than 4 times per year    Active member of club or organization: Yes    Attends meetings of clubs or organizations: More than 4 times per year    Relationship status: Married  Other Topics Concern  . Not on file  Social History Narrative   Retired, has daughter living at home with her and her husband     Outpatient Encounter Medications as of 06/07/2018   Medication Sig  . albuterol (PROVENTIL HFA;VENTOLIN HFA) 108 (90 Base) MCG/ACT inhaler Inhale 1-2 puffs into the lungs every 6 (six) hours as needed for wheezing or shortness of breath.  Mar[place] Kitchen alendronate (FOSAMAX) 70 MG tablet Take 1 tablet (70 mg total) by mouth once a week. Take with a full glass of water on an empty stomach.  Marland Kitchen amLODipine (NORVASC) 5 MG tablet Take 1 tablet (5 mg total) by mouth daily.  Marland Kitchen aspirin 81 MG tablet Take 81 mg by mouth daily.  Marland Kitchen losartan (COZAAR) 100 MG tablet Take 1 tablet (100 mg total) by mouth daily.  Marland Kitchen omeprazole (PRILOSEC) 20 MG capsule Take 1 capsule (20 mg total) by mouth daily.  Marland Kitchen venlafaxine XR (EFFEXOR XR) 37.5 MG 24 hr capsule Take along with 75 mg effexor XR daily to total 112.5 mg daily.  Marland Kitchen venlafaxine XR (EFFEXOR XR) 75 MG 24 hr capsule Take along with 37.5 mg effexor XR daily to total 112.5 mg daily.  . [DISCONTINUED] losartan (COZAAR) 50 MG tablet Take 1 tablet (50 mg total) by mouth daily.   No facility-administered encounter medications on file as of 06/07/2018.     Activities of Daily Living In your present state of health, do you have any difficulty performing the following activities: 06/07/2018 06/18/2017  Hearing? N N  Vision? Y Y  Comment goes to [facility]  pt states her vision is blurry sometimes  Difficulty concentrating or making decisions? N N  Walking or climbing stairs? N N  Dressing or bathing? N N  Doing errands, shopping? N N  Preparing Food and eating ? N -  Using the Toilet? N -  In the past six months, have you accidently leaked urine? N -  Do you have problems with loss of bowel control? N -  Managing your Medications? N -  Managing your Finances? N -  Housekeeping or managing your Housekeeping? N -  Some recent data might be hidden    Patient Care Team: Venita Lick, NP as PCP - General (Nurse Practitioner) Leandrew Koyanagi, MD as Referring Physician (Ophthalmology) Arelia Sneddon, OD (Optometry)     Assessment:   This is a routine wellness examination for Lauren Nolan.  Exercise Activities and Dietary recommendations Current Exercise Habits: The patient does not participate in regular exercise at present, Exercise limited by: None identified  Goals    .  Quit Smoking     Smoking cessation discussed       Fall Risk Fall Risk  06/07/2018 02/15/2018 06/18/2017 05/07/2016 05/07/2015  Falls in the past year? 0 0 No No No   FALL RISK PREVENTION PERTAINING TO THE HOME:  Any stairs in or around the home? Yes  If so, are there any without handrails? Yes   Home free of loose throw rugs in walkways, pet beds, electrical cords, etc? Yes  Adequate lighting in your home to reduce risk of falls? Yes   ASSISTIVE DEVICES UTILIZED TO PREVENT FALLS:  Life alert? No  Use of a cane, walker or w/c? No  Grab bars in the bathroom? No  Shower chair or bench in shower? No  Elevated toilet seat or a handicapped toilet? No   DME ORDERS:  DME order needed?  No   TIMED UP AND GO:  Was the test performed? Yes .  Length of time to ambulate 10 feet: 11 sec.   GAIT:  Appearance of gait: Gait stead-fast without the use of an assistive device.  Education: Fall risk prevention has been discussed.  Intervention(s) required? No    Depression Screen PHQ 2/9 Scores 06/07/2018 04/26/2018 02/15/2018 10/12/2017  PHQ - 2 Score 0 0 0 0  PHQ- 9 Score - 1 0 0     Cognitive Function     6CIT Screen 06/07/2018  What Year? 0 points  What month? 0 points  What time? 0 points  Count back from 20 0 points  Months in reverse 0 points  Repeat phrase 0 points  Total Score 0    Immunization History  Administered Date(s) Administered  . Influenza, High Dose Seasonal PF 12/16/2017  . Influenza-Unspecified 01/11/2015, 01/11/2016, 12/31/2016  . Pneumococcal Conjugate-13 08/22/2013  . Pneumococcal Polysaccharide-23 07/05/2014  . Tdap 06/07/2010  . Zoster 04/11/2008    Qualifies for Shingles Vaccine? Yes  Zostavax  completed 04/11/2008 Due for Shingrix. Education has been provided regarding the importance of this vaccine. Pt has been advised to call insurance company to determine out of pocket expense. Advised may also receive vaccine at local pharmacy or Health Dept. Verbalized acceptance and understanding.  Tdap: up to date   Flu Vaccine: up to date   Pneumococcal Vaccine: up to date  Screening Tests Health Maintenance  Topic Date Due  . COLONOSCOPY  06/08/2019 (Originally 10/14/2015)  . MAMMOGRAM  07/11/2019  . TETANUS/TDAP  06/07/2020  . INFLUENZA VACCINE  Completed  . DEXA SCAN  Completed  . Hepatitis C Screening  Completed  . PNA vac Low Risk Adult  Completed   Cancer Screenings:  Colorectal Screening: Completed 2007. Repeat every 5 years; patient declined colonoscopy, she states she did a fecal test last year.  Mammogram: Completed 07/10/2017. Repeat every 2 years.  Bone Density: Completed 09/06/2014.   Lung Cancer Screening: (Low Dose CT Chest recommended if Age 68-80 years, 30 pack-year currently smoking OR have quit w/in 15years.) does not qualify.    Additional Screening:  Hepatitis C Screening: does qualify; Completed 05/07/2015  Vision Screening: Recommended annual ophthalmology exams for early detection of glaucoma and other disorders of the eye. Is the patient up to date with their annual eye exam?  Yes  Who is the provider or what is the name of the office in which the pt attends annual eye exams? [facility]    Dental Screening: Recommended annual dental exams for proper oral hygiene  Community Resource Referral:  CRR required this visit?  No      Plan:    I have personally reviewed and addressed the Medicare Annual Wellness questionnaire and have noted the following in the patient's chart:  A. Medical and social history B. Use of alcohol, tobacco or illicit drugs  C. Current medications and supplements D. Functional ability and status E.  Nutritional  status F.  Physical activity G. Advance directives H. List of other physicians I.  Hospitalizations, surgeries, and ER visits in previous 12 months J.  [place] such as hearing and vision if needed, cognitive and depression L. Referrals and appointments   In addition, I have reviewed and discussed with patient certain preventive protocols, quality metrics, and best practice recommendations. A written personalized care plan for preventive services as well as general preventive health recommendations were provided to patient.   Signed,  Tyler Aas, LPN Nurse Health Advisor   Nurse Notes:   Mrs.Strange states she has 2 different bottles of losartan, a 100mg  prescription and a 50mg . Patient states she takes one 100mg  and one 50mg  daily. Per Jolene Cannady,NPs last note patient should be on 100mg  daily. Clarified this with Jolene while patient is in office today. Jolene talked with patient and verbalized to patient to only take 100mg  total. 50mg  losartan was taken off medication list today. Mrs. Bobe verbalized understanding.   Mrs.Sebring also requested to come down on her Effexor, states she has done this in the past and seemed to do okay on a lower dose. Jolene advised patient to stop taking the 37.5mg  tablet of Effexor and only take the 75mg  tablet daily for the next month, she is scheduled to follow up on 07/08/2018. Patient informed to call interm if needed. Patient verbalized all understanding.

## 2018-06-21 ENCOUNTER — Other Ambulatory Visit: Payer: Self-pay | Admitting: Nurse Practitioner

## 2018-06-21 DIAGNOSIS — I10 Essential (primary) hypertension: Secondary | ICD-10-CM

## 2018-06-21 MED ORDER — AMLODIPINE BESYLATE 5 MG PO TABS: 5.0000 mg | ORAL_TABLET | Freq: Every day | ORAL | 0 refills | Status: DC

## 2018-06-21 NOTE — Telephone Encounter (Signed)
Copied from [place] [phone]. Topic: Quick Communication - Rx Refill/Question >> Jun 21, 2018  4:02 PM Wynetta Emery, [place] C wrote: Medication: amLODipine (NORVASC) 5 MG tablet -- 90 day supply   Has the patient contacted their pharmacy? Yes  (Agent: If no, request that the patient contact the pharmacy for the refill.) (Agent: If yes, when and what did the pharmacy advise?)  Preferred Pharmacy (with phone number or street name): [place], [place] [phone] (Phone) [phone] (Fax)    Agent: Please be advised that RX refills may take up to 3 business days. We ask that you follow-up with your pharmacy.

## 2018-07-05 ENCOUNTER — Telehealth: Payer: Self-pay | Admitting: Nurse Practitioner

## 2018-07-05 NOTE — Telephone Encounter (Signed)
Copied from [place] [phone]. Topic: Appointment Scheduling - Scheduling Inquiry for Clinic >> Jul 05, 2018 12:44 PM Valla Leaver wrote: Reason for CRM: Cancelled 03/26 appt. Declined e-visit.

## 2018-07-05 NOTE — Telephone Encounter (Signed)
Noted  

## 2018-07-08 ENCOUNTER — Ambulatory Visit: Payer: Medicare Other | Admitting: Nurse Practitioner

## 2018-07-08 ENCOUNTER — Other Ambulatory Visit: Payer: Self-pay | Admitting: Family Medicine

## 2018-07-08 NOTE — Telephone Encounter (Signed)
Requested medication (s) are due for refill today: Yes  Requested medication (s) are on the active medication list: Yes  Last refill:  03/2017  Future visit scheduled: No  Notes to clinic: Expired Rx, unable to refill     Requested Prescriptions  Pending Prescriptions Disp Refills   VENTOLIN HFA 108 (90 Base) MCG/ACT inhaler [Pharmacy Med Name: VENTOLIN HFA 90 MCG INHALER] 18 g 0    Sig: Inhale 1-2 puffs into the lungs every 6 (six) hours as needed for wheezing or shortness of breath.     Pulmonology:  Beta Agonists Failed - 07/08/2018  2:28 PM      Failed - One inhaler should last at least one month. If the patient is requesting refills earlier, contact the patient to check for uncontrolled symptoms.      Passed - Valid encounter within last 12 months    Recent Outpatient Visits          2 months ago Acute non-recurrent maxillary sinusitis   [place], Barbaraann Faster, NP   4 months ago Neck pain   [place], [place] T, NP   6 months ago COPD exacerbation [facility])   [place], PA-C   6 months ago Need for influenza vaccination   [place], [place]   7 months ago Reactive depression   [facility] Carles Collet M, [place]

## 2018-10-09 ENCOUNTER — Other Ambulatory Visit: Payer: Self-pay | Admitting: Family Medicine

## 2018-10-09 NOTE — Telephone Encounter (Signed)
Requested Prescriptions  Pending Prescriptions Disp Refills  . VENTOLIN HFA 108 (90 Base) MCG/ACT inhaler [Pharmacy Med Name: VENTOLIN HFA 90 MCG INHALER] 18 g 0    Sig: Inhale 1-2 puffs into the lungs every 6 (six) hours as needed for wheezing or shortness of breath.     Pulmonology:  Beta Agonists Failed - 10/09/2018  8:53 AM      Failed - One inhaler should last at least one month. If the patient is requesting refills earlier, contact the patient to check for uncontrolled symptoms.      Passed - Valid encounter within last 12 months    Recent Outpatient Visits          5 months ago Acute non-recurrent maxillary sinusitis   [place], Barbaraann Faster, NP   7 months ago Neck pain   [place], [place] T, NP   9 months ago COPD exacerbation [facility])   [place], PA-C   9 months ago Need for influenza vaccination   [facility] Carles Collet M, [place]   10 months ago Reactive depression   [facility] Carles Collet M, [place]

## 2018-10-10 ENCOUNTER — Other Ambulatory Visit: Payer: Self-pay | Admitting: Family Medicine

## 2018-10-13 ENCOUNTER — Other Ambulatory Visit: Payer: Self-pay | Admitting: Nurse Practitioner

## 2018-11-17 ENCOUNTER — Other Ambulatory Visit: Payer: Self-pay | Admitting: Unknown Physician Specialty

## 2018-11-17 NOTE — Telephone Encounter (Signed)
Requested medication (s) are due for refill today: no  Requested medication (s) are on the active medication list: no  Last refill:  10/14/17  Future visit scheduled: No  Notes to clinic:  Unable to refill per protocol. Medication not on current med list.     Requested Prescriptions  Pending Prescriptions Disp Refills   triamcinolone cream (KENALOG) 0.1 % [Pharmacy Med Name: TRIAMCINOLONE 0.1% CREAM] 30 g 0    Sig: Apply 1 application topically 2 (two) times daily.     Dermatology:  Corticosteroids Passed - 11/17/2018 10:14 AM      Passed - Valid encounter within last 12 months    Recent Outpatient Visits          6 months ago Acute non-recurrent maxillary sinusitis   [place], Barbaraann Faster, NP   9 months ago Neck pain   [place], [place] T, NP   10 months ago COPD exacerbation [facility])   [place], PA-C   11 months ago Need for influenza vaccination   [facility] Trinna Post, PA-C   1 year ago Reactive depression   [facility] Carles Collet M, [place]

## 2019-01-07 ENCOUNTER — Ambulatory Visit (INDEPENDENT_AMBULATORY_CARE_PROVIDER_SITE_OTHER): Payer: Medicare Other

## 2019-01-07 ENCOUNTER — Other Ambulatory Visit: Payer: Self-pay

## 2019-01-07 DIAGNOSIS — Z23 Encounter for immunization: Secondary | ICD-10-CM

## 2019-03-02 ENCOUNTER — Other Ambulatory Visit: Payer: Self-pay | Admitting: Nurse Practitioner

## 2019-03-02 DIAGNOSIS — I10 Essential (primary) hypertension: Secondary | ICD-10-CM

## 2019-03-02 DIAGNOSIS — F329 Major depressive disorder, single episode, unspecified: Secondary | ICD-10-CM

## 2019-03-02 NOTE — Telephone Encounter (Signed)
Requested medication (s) are due for refill today: yes  Requested medication (s) are on the active medication list: yes  Last refill:  11/27/2018  Future visit scheduled: no  Notes to clinic: review for refill Overdue for office visit    Requested Prescriptions  Pending Prescriptions Disp Refills   venlafaxine XR (EFFEXOR-XR) 75 MG 24 hr capsule [Pharmacy Med Name: VENLAFAXINE HCL ER 75 MG CAP] 90 capsule 0    Sig: Take along with 37.5 mg effexor XR daily to total 112.5 mg daily.     Psychiatry: Antidepressants - SNRI - desvenlafaxine & venlafaxine Failed - 03/02/2019 11:57 AM      Failed - LDL in normal range and within 360 days    LDL Calculated  Date Value Ref Range Status  06/18/2017 42 0 - 99 mg/dL Final         Failed - Total Cholesterol in normal range and within 360 days    Cholesterol, Total  Date Value Ref Range Status  06/18/2017 160 100 - 199 mg/dL Final   Cholesterol Piccolo, Waived  Date Value Ref Range Status  05/07/2015 176 <200 mg/dL Final    Comment:                            Desirable                <200                         Borderline High      200- 239                         High                     >239          Failed - Triglycerides in normal range and within 360 days    Triglycerides  Date Value Ref Range Status  06/18/2017 102 0 - 149 mg/dL Final   Triglycerides Piccolo,Waived  Date Value Ref Range Status  05/07/2015 119 <150 mg/dL Final    Comment:                            Normal                   <150                         Borderline High     150 - 199                         High                200 - 499                         Very High                >499          Failed - Last BP in normal range    BP Readings from Last 1 Encounters:  06/07/18 (!) 148/86         Failed - Valid encounter within last 6 months    Recent Outpatient Visits  10 months ago Acute non-recurrent maxillary sinusitis   [place], Barbaraann Faster, NP   1 year ago Neck pain   [place], [place] T, NP   1 year ago COPD exacerbation [facility])   [facility] Trin[place] Post, PA-C   1 year ago Need for influenza vaccination   [facility] Trinna Post, PA-C   1 year ago Reactive depression   [place], [place], PA-C             Passed - Completed PHQ-2 or PHQ-9 in the last 360 days.       losartan (COZAAR) 100 MG tablet [Pharmacy Med Name: LOSARTAN POTASSIUM 100 MG TAB] 90 tablet 0    Sig: Take 1 tablet (100 mg total) by mouth daily.     Cardiovascular:  Angiotensin Receptor Blockers Failed - 03/02/2019 11:57 AM      Failed - Cr in normal range and within 180 days    Creatinine, Ser  Date Value Ref Range Status  06/18/2017 0.66 0.57 - 1.00 mg/dL Final         Failed - K in normal range and within 180 days    Potassium  Date Value Ref Range Status  06/18/2017 4.3 3.5 - 5.2 mmol/L Final         Failed - Last BP in normal range    BP Readings from Last 1 Encounters:  06/07/18 (!) 148/86         Failed - Valid encounter within last 6 months    Recent Outpatient Visits          10 months ago Acute non-recurrent maxillary sinusitis   [place], Barbaraann Faster, NP   1 year ago Neck pain   [place], [place] T, NP   1 year ago COPD exacerbation [facility])   [place] Trinna Post, PA-C   1 year ago Need for influenza vaccination   [facility] Trinna Post, PA-C   1 year ago Reactive depression   [place], [place], [place]             Passed - Patient is not pregnant

## 2019-03-02 NOTE — Telephone Encounter (Signed)
30 day supply only sent, please alert patient she needs office visit for further refills.  Can do virtually and obtain any labs outpatient.

## 2019-03-04 NOTE — Telephone Encounter (Signed)
Letter generated and sent to patient.  

## 2019-03-17 ENCOUNTER — Encounter: Payer: Self-pay | Admitting: Nurse Practitioner

## 2019-03-17 ENCOUNTER — Other Ambulatory Visit: Payer: Self-pay

## 2019-03-17 ENCOUNTER — Ambulatory Visit (INDEPENDENT_AMBULATORY_CARE_PROVIDER_SITE_OTHER): Payer: Medicare Other | Admitting: Nurse Practitioner

## 2019-03-17 VITALS — BP 148/87 | HR 92 | Wt 100.0 lb

## 2019-03-17 DIAGNOSIS — F329 Major depressive disorder, single episode, unspecified: Secondary | ICD-10-CM | POA: Diagnosis not present

## 2019-03-17 DIAGNOSIS — F17210 Nicotine dependence, cigarettes, uncomplicated: Secondary | ICD-10-CM | POA: Diagnosis not present

## 2019-03-17 DIAGNOSIS — F411 Generalized anxiety disorder: Secondary | ICD-10-CM | POA: Diagnosis not present

## 2019-03-17 DIAGNOSIS — I10 Essential (primary) hypertension: Secondary | ICD-10-CM | POA: Diagnosis not present

## 2019-03-17 DIAGNOSIS — M810 Age-related osteoporosis without current pathological fracture: Secondary | ICD-10-CM

## 2019-03-17 MED ORDER — VENLAFAXINE HCL ER 75 MG PO CP24: 75.0000 mg | ORAL_CAPSULE | Freq: Every day | ORAL | 0 refills | Status: DC

## 2019-03-17 MED ORDER — LOSARTAN POTASSIUM 100 MG PO TABS: 100.0000 mg | ORAL_TABLET | Freq: Every day | ORAL | 3 refills | Status: DC

## 2019-03-17 MED ORDER — ALBUTEROL SULFATE HFA 108 (90 BASE) MCG/ACT IN AERS: INHALATION_SPRAY | RESPIRATORY_TRACT | 3 refills | Status: DC

## 2019-03-17 MED ORDER — ALENDRONATE SODIUM 70 MG PO TABS: 70.0000 mg | ORAL_TABLET | ORAL | 3 refills | Status: DC

## 2019-03-17 MED ORDER — AMLODIPINE BESYLATE 10 MG PO TABS: 10.0000 mg | ORAL_TABLET | Freq: Every day | ORAL | 3 refills | Status: DC

## 2019-03-17 MED ORDER — FLUTICASONE PROPIONATE 50 MCG/ACT NA SUSP: 2.0000 | Freq: Every day | NASAL | 6 refills | Status: DC

## 2019-03-17 MED ORDER — OMEPRAZOLE 20 MG PO CPDR: 20.0000 mg | DELAYED_RELEASE_CAPSULE | Freq: Every day | ORAL | 3 refills | Status: DC

## 2019-03-17 NOTE — Patient Instructions (Signed)

## 2019-03-17 NOTE — Assessment & Plan Note (Signed)
Chronic, stable.  Denies SI/HI.  She is interested in reducing Effexor, which we will discuss face to face at visit in 4 weeks.  This would be beneficial for BP and could consider change to an SSRI which would benefit anxiety and depression.

## 2019-03-17 NOTE — Assessment & Plan Note (Addendum)
Chronic, ongoing.  BP elevated above goal at home often.  Will increase Amlodipine to 10 MG, script sent, and continue Losartan 100 MG.  Will see in office in 4 weeks for f/u and labs, would consider changing Effexor to an SSRI due to effect of Effexor on BP.  She is to check BP daily at home and document.  Recommend complete cessation of smoking.

## 2019-03-17 NOTE — Assessment & Plan Note (Signed)
I have recommended complete cessation of tobacco use. I have discussed various options available for assistance with tobacco cessation including over the counter methods (Nicotine gum, patch and lozenges). We also discussed prescription options (Chantix, Nicotine Inhaler / Nasal Spray). The patient is not interested in pursuing any prescription tobacco cessation options at this time. Recommended lung CT screening as has not had, she wishes to think about this and will further discuss next visit.

## 2019-03-17 NOTE — Progress Notes (Signed)
BP (!) 148/87   Pulse 92 Comment: repeat  Wt 100 lb (45.4 kg)   BMI 18.29 kg/m    Subjective:    Patient ID: Lauren Nolan, female    DOB: 18-Feb-1946, 73 y.o.   MRN: [id number]  HPI: Lauren Nolan is a 73 y.o. female  Chief Complaint  Patient presents with  . Depression  . Hypertension    . This visit was completed via telephone due to the restrictions of the COVID-19 pandemic. All issues as above were discussed and addressed but no physical exam was performed. If it was felt that the patient should be evaluated in the office, they were directed there. The patient verbally consented to this visit. Patient was unable to complete an audio/visual visit due to Lack of equipment. Due to the catastrophic nature of the COVID-19 pandemic, this visit was done through audio contact only. . Location of the patient: home . Location of the provider: work . Those involved with this call:  . Provider: Marnee Guarneri, DNP . CMA: Yvonna Alanis, CMA . Front Desk/Registration: Jill Side  . Time spent on call: 15 minutes on the phone discussing health concerns. 10 minutes total spent in review of patient's record and preparation of their chart.  . I verified patient identity using two factors (patient name and date of birth). Patient consents verbally to being seen via telemedicine visit today.    DEPRESSION/ANXIETY Continues on Effexor 75 MG. Mood status: stable Satisfied with current treatment?: yes Symptom severity: mild  Duration of current treatment : chronic Side effects: no Medication compliance: good compliance Psychotherapy/counseling: none Previous psychiatric medications: Effexor Depressed mood: no Anxious mood: occasionally Anhedonia: no Significant weight loss or gain: no Insomnia: none Fatigue: no Feelings of worthlessness or guilt: no Impaired concentration/indecisiveness: occasionally Suicidal ideations: no Hopelessness: no Crying spells: no Depression  screen [facility] 2/9 03/17/2019 06/07/2018 04/26/2018 02/15/2018 10/12/2017  Decreased Interest 0 0 0 0 0  Down, Depressed, Hopeless 0 0 0 0 0  PHQ - 2 Score 0 0 0 0 0  Altered sleeping 0 - 0 0 0  Tired, decreased energy 1 - 0 0 0  Change in appetite 0 - 0 0 0  Feeling bad or failure about yourself  0 - 1 0 0  Trouble concentrating 0 - 0 0 0  Moving slowly or fidgety/restless 0 - 0 0 0  Suicidal thoughts 0 - 0 0 0  PHQ-9 Score 1 - 1 0 0  Difficult doing work/chores Not difficult at all - Not difficult at all - -   GAD 7 : Generalized Anxiety Score 04/26/2018 02/15/2018  Nervous, Anxious, on Edge 1 1  Control/stop worrying 0 1  Worry too much - different things 0 0  Trouble relaxing 0 0  Restless 0 0  Easily annoyed or irritable 0 0  Afraid - awful might happen 0 0  Total GAD 7 Score 1 2  Anxiety Difficulty Not difficult at all -    HYPERTENSION Continues on Amlodipine and Losartan, ASA.  Continues to smoke, 1/2 PPD, not interested in quitting.  Does endorse some occasional SOB over the past couple years.  Has been smoking since she was age 70.   Hypertension status: stable  Satisfied with current treatment? yes Duration of hypertension: chronic BP monitoring frequency:  rarely BP range: 140-150/80-90's BP medication side effects:  no Medication compliance: good compliance Aspirin: yes Recurrent headaches: no Visual changes: no Palpitations: no Dyspnea: no Chest pain: no Lower  extremity edema: no Dizzy/lightheaded: no  Relevant past medical, surgical, family and social history reviewed and updated as indicated. Interim medical history since our last visit reviewed. Allergies and medications reviewed and updated.  Review of Systems  Constitutional: Negative for activity change, appetite change, diaphoresis, fatigue and fever.  Respiratory: Negative for cough, chest tightness and shortness of breath.   Cardiovascular: Negative for chest pain, palpitations and leg swelling.   Gastrointestinal: Negative for abdominal distention, abdominal pain, constipation, diarrhea, nausea and vomiting.  Neurological: Negative for dizziness, syncope, weakness, light-headedness, numbness and headaches.  Psychiatric/Behavioral: Negative.     Per HPI unless specifically indicated above     Objective:    BP (!) 148/87   Pulse 92 Comment: repeat  Wt 100 lb (45.4 kg)   BMI 18.29 kg/m   Wt Readings from Last 3 Encounters:  03/17/19 100 lb (45.4 kg)  06/07/18 104 lb 3.2 oz (47.3 kg)  04/26/18 104 lb (47.2 kg)    Physical Exam   Unable to perform due to telephone only visit.  Results for orders placed or performed in visit on 04/26/18  Veritor Flu A/B Waived  Result Value Ref Range   Influenza A Negative Negative   Influenza B Negative Negative      Assessment & Plan:   Problem List Items Addressed This Visit      Cardiovascular and Mediastinum   Hypertension - Primary    Chronic, ongoing.  BP elevated above goal at home often.  Will increase Amlodipine to 10 MG, script sent, and continue Losartan 100 MG.  Will see in office in 4 weeks for f/u and labs, would consider changing Effexor to an SSRI due to effect of Effexor on BP.  She is to check BP daily at home and document.  Recommend complete cessation of smoking.      Relevant Medications   amLODipine (NORVASC) 10 MG tablet   losartan (COZAAR) 100 MG tablet     Musculoskeletal and Integument   Osteoporosis   Relevant Medications   alendronate (FOSAMAX) 70 MG tablet     Other   Generalized anxiety disorder    Chronic, stable.  Denies SI/HI.  Will see in office in 4 weeks and discuss change to SSRI vs Effexor due to her underlying HTN + change may benefit both anxiety and depression.      Relevant Medications   venlafaxine XR (EFFEXOR-XR) 75 MG 24 hr capsule   Depression    Chronic, stable.  Denies SI/HI.  She is interested in reducing Effexor, which we will discuss face to face at visit in 4 weeks.  This  would be beneficial for BP and could consider change to an SSRI which would benefit anxiety and depression.        Relevant Medications   venlafaxine XR (EFFEXOR-XR) 75 MG 24 hr capsule   Nicotine dependence, cigarettes, uncomplicated    I have recommended complete cessation of tobacco use. I have discussed various options available for assistance with tobacco cessation including over the counter methods (Nicotine gum, patch and lozenges). We also discussed prescription options (Chantix, Nicotine Inhaler / Nasal Spray). The patient is not interested in pursuing any prescription tobacco cessation options at this time. Recommended lung CT screening as has not had, she wishes to think about this and will further discuss next visit.         I discussed the assessment and treatment plan with the patient. The patient was provided an opportunity to ask questions and all  were answered. The patient agreed with the plan and demonstrated an understanding of the instructions.   The patient was advised to call back or seek an in-person evaluation if the symptoms worsen or if the condition fails to improve as anticipated.   I provided 15 minutes of time during this encounter.   Follow up plan: Return in about 4 weeks (around 04/14/2019) for HTN and Mood.

## 2019-03-17 NOTE — Assessment & Plan Note (Signed)
Chronic, stable.  Denies SI/HI.  Will see in office in 4 weeks and discuss change to SSRI vs Effexor due to her underlying HTN + change may benefit both anxiety and depression.

## 2019-03-18 ENCOUNTER — Telehealth: Payer: Self-pay | Admitting: Nurse Practitioner

## 2019-03-18 NOTE — Telephone Encounter (Signed)
LVM to call back to shedule 4 week in office htn and mood f/u -per Cannady,Jolene T,np

## 2019-03-21 ENCOUNTER — Encounter: Payer: Self-pay | Admitting: Nurse Practitioner

## 2019-03-21 ENCOUNTER — Telehealth: Payer: Self-pay | Admitting: Nurse Practitioner

## 2019-03-21 NOTE — Telephone Encounter (Signed)
Called pt no answer, sent letter.

## 2019-04-20 ENCOUNTER — Telehealth: Payer: Self-pay

## 2019-04-20 NOTE — Telephone Encounter (Signed)
Is this what you recommend? Copied from [place] [phone]. Topic: General - Other >> Apr 20, 2019  9:29 AM Lennox Solders wrote: Reason for CRM: pt husband is calling and would like to know if his wife should get covid 19 vaccine

## 2019-04-20 NOTE — Telephone Encounter (Signed)
I do recommend she obtain once it is available for her age group.

## 2019-04-21 NOTE — Telephone Encounter (Signed)
Patient's husband notified  

## 2019-05-26 ENCOUNTER — Encounter: Payer: Self-pay | Admitting: Family Medicine

## 2019-05-26 ENCOUNTER — Ambulatory Visit (INDEPENDENT_AMBULATORY_CARE_PROVIDER_SITE_OTHER): Payer: Medicare Other | Admitting: Family Medicine

## 2019-05-26 ENCOUNTER — Other Ambulatory Visit: Payer: Self-pay

## 2019-05-26 DIAGNOSIS — L821 Other seborrheic keratosis: Secondary | ICD-10-CM

## 2019-05-26 NOTE — Assessment & Plan Note (Signed)
Inflamed and getting caught on her clothes. Discussed observation vs excision vs cryotherapy. Pt considering excision but wants to think on it. Will cover with neosporin and bandage given small area of open tissue from pt picking at it. Await her decision

## 2019-05-26 NOTE — Progress Notes (Signed)
   BP 115/78   Pulse 99   Temp 98.5 F (36.9 C) (Oral)   Ht 5\' 1"  (1.549 m)   Wt 102 lb (46.3 kg)   BMI 19.27 kg/m    Subjective:    Patient ID: Lauren Nolan, female    DOB: 1946/01/31, 74 y.o.   MRN: [id number]  HPI: BISHOP HILLIGOSS is a 74 y.o. female  Chief Complaint  Patient presents with  . Nevus    upper back for a long time ago. pt states at first the mole was black and it turned to gray lately   Patient presenting today for a mole on her upper back that seems to be changing color. Started out super dark colored and now turning grey. Sometimes itchy. Denies known hx of skin cancer.   Relevant past medical, surgical, family and social history reviewed and updated as indicated. Interim medical history since our last visit reviewed. Allergies and medications reviewed and updated.  Review of Systems  Per HPI unless specifically indicated above     Objective:    BP 115/78   Pulse 99   Temp 98.5 F (36.9 C) (Oral)   Ht 5\' 1"  (1.549 m)   Wt 102 lb (46.3 kg)   BMI 19.27 kg/m   Wt Readings from Last 3 Encounters:  05/26/19 102 lb (46.3 kg)  03/17/19 100 lb (45.4 kg)  06/07/18 104 lb 3.2 oz (47.3 kg)    Physical Exam Vitals and nursing note reviewed.  Constitutional:      Appearance: Normal appearance. She is not ill-appearing.  HENT:     Head: Atraumatic.  Eyes:     Extraocular Movements: Extraocular movements intact.     Conjunctiva/sclera: Conjunctivae normal.  Cardiovascular:     Rate and Rhythm: Normal rate and regular rhythm.     Heart sounds: Normal heart sounds.  Pulmonary:     Effort: Pulmonary effort is normal.     Breath sounds: Normal breath sounds.  Musculoskeletal:        General: Normal range of motion.     Cervical back: Normal range of motion and neck supple.  Skin:    General: Skin is warm and dry.     Comments: Inflamed seborrheic keratosis upper central back  Neurological:     Mental Status: She is alert and oriented to person,  place, and time.  Psychiatric:        Mood and Affect: Mood normal.        Thought Content: Thought content normal.        Judgment: Judgment normal.     Results for orders placed or performed in visit on 04/26/18  Veritor Flu A/B Waived  Result Value Ref Range   Influenza A Negative Negative   Influenza B Negative Negative      Assessment & Plan:   Problem List Items Addressed This Visit      Musculoskeletal and Integument   Seborrheic keratosis    Inflamed and getting caught on her clothes. Discussed observation vs excision vs cryotherapy. Pt considering excision but wants to think on it. Will cover with neosporin and bandage given small area of open tissue from pt picking at it. Await her decision          Follow up plan: Return for Excision procedure.

## 2019-05-27 ENCOUNTER — Ambulatory Visit: Payer: Medicare Other | Admitting: Family Medicine

## 2019-05-27 ENCOUNTER — Other Ambulatory Visit: Payer: Self-pay

## 2019-05-27 ENCOUNTER — Encounter: Payer: Self-pay | Admitting: Family Medicine

## 2019-05-27 VITALS — BP 129/78 | HR 70 | Temp 97.5°F | Ht 61.0 in | Wt 102.0 lb

## 2019-05-27 DIAGNOSIS — D489 Neoplasm of uncertain behavior, unspecified: Secondary | ICD-10-CM | POA: Diagnosis not present

## 2019-05-30 ENCOUNTER — Other Ambulatory Visit (HOSPITAL_COMMUNITY)
Admission: RE | Admit: 2019-05-30 | Discharge: 2019-05-30 | Disposition: A | Discharge status: Home / Self Care | Payer: Medicare Other | Source: Ambulatory Visit | Attending: Family Medicine | Admitting: Family Medicine

## 2019-05-30 DIAGNOSIS — D489 Neoplasm of uncertain behavior, unspecified: Secondary | ICD-10-CM | POA: Insufficient documentation

## 2019-05-30 DIAGNOSIS — L820 Inflamed seborrheic keratosis: Secondary | ICD-10-CM | POA: Diagnosis not present

## 2019-06-01 LAB — SURGICAL PATHOLOGY

## 2019-06-01 NOTE — Progress Notes (Signed)
BP 129/78   Pulse 70   Temp (!) 97.5 F (36.4 C) (Oral)   Ht 5\' 1"  (1.549 m)   Wt 102 lb (46.3 kg)   BMI 19.27 kg/m    Subjective:    Patient ID: Lauren Nolan, female    DOB: June 01, 1945, 74 y.o.   MRN: [id number]  HPI: Lauren Nolan is a 74 y.o. female  Chief Complaint  Patient presents with  . Nevus    removal   Patient presenting today for procedure - shave excision and biopsy of suspected seborrheic keratosis as it continues to catch her clothing and become inflamed and painful.Area is on upper left back and has been growing and changing colors which also unnerves her. Has not been putting anything on the area when it gets irritated.   Relevant past medical, surgical, family and social history reviewed and updated as indicated. Interim medical history since our last visit reviewed. Allergies and medications reviewed and updated.  Review of Systems  Per HPI unless specifically indicated above     Objective:    BP 129/78   Pulse 70   Temp (!) 97.5 F (36.4 C) (Oral)   Ht 5\' 1"  (1.549 m)   Wt 102 lb (46.3 kg)   BMI 19.27 kg/m   Wt Readings from Last 3 Encounters:  05/27/19 102 lb (46.3 kg)  05/26/19 102 lb (46.3 kg)  03/17/19 100 lb (45.4 kg)    Physical Exam Vitals and nursing note reviewed.  Constitutional:      Appearance: Normal appearance. She is not ill-appearing.  HENT:     Head: Atraumatic.  Eyes:     Extraocular Movements: Extraocular movements intact.     Conjunctiva/sclera: Conjunctivae normal.  Cardiovascular:     Rate and Rhythm: Normal rate and regular rhythm.     Heart sounds: Normal heart sounds.  Pulmonary:     Effort: Pulmonary effort is normal.     Breath sounds: Normal breath sounds.  Musculoskeletal:        General: Normal range of motion.     Cervical back: Normal range of motion and neck supple.  Skin:    General: Skin is warm and dry.     Comments: 1.5 cm raised, ashy brown scaly lesion left upper back  Neurological:   Mental Status: She is alert and oriented to person, place, and time.  Psychiatric:        Mood and Affect: Mood normal.        Thought Content: Thought content normal.        Judgment: Judgment normal.    Procedure: Shave excision and biopsy of neoplasm of uncertain behavior (suspected to be seborrheic keratosis) left upper back  Procedure explained in detail, patient given chance to ask questions which were all answered adequately. Area was cleaned with alcohol pad and infiltrated with 3 cc 1% lidocaine with epi. Area then sterilized with betadine in usual fashion and dermablade was used to neatly excise entire lesion, which was placed into pathology cup and labeled.Silver nitrate sticks and gauze with pressure were used to control minimal amount of bleeding. Once hemostasis was achieved, pressure dressing applied. Wound care at home reviewed and patient understanding. Procedure well tolerated with no immediate complications noted.   Results for orders placed or performed in visit on 04/26/18  Veritor Flu A/B Waived  Result Value Ref Range   Influenza A Negative Negative   Influenza B Negative Negative      Assessment &  Plan:   Problem List Items Addressed This Visit    None    Visit Diagnoses    Neoplasm of uncertain behavior    -  Primary   Strongly suspect seb K, reassurance given but await pathology report for reassurance. Home wound care reviewed. Excision today uncomplicated   Relevant Orders   Surgical pathology       Follow up plan: Return if symptoms worsen or fail to improve.

## 2019-06-24 ENCOUNTER — Ambulatory Visit: Payer: Medicare Other | Admitting: Family Medicine

## 2019-06-24 ENCOUNTER — Ambulatory Visit: Payer: Self-pay

## 2019-06-24 ENCOUNTER — Other Ambulatory Visit: Payer: Self-pay

## 2019-06-24 ENCOUNTER — Encounter: Payer: Self-pay | Admitting: Family Medicine

## 2019-06-24 VITALS — BP 124/78 | HR 99 | Temp 99.2°F

## 2019-06-24 DIAGNOSIS — S99912A Unspecified injury of left ankle, initial encounter: Secondary | ICD-10-CM | POA: Diagnosis not present

## 2019-06-24 DIAGNOSIS — M79672 Pain in left foot: Secondary | ICD-10-CM

## 2019-06-24 DIAGNOSIS — S99922A Unspecified injury of left foot, initial encounter: Secondary | ICD-10-CM | POA: Diagnosis not present

## 2019-06-24 NOTE — Progress Notes (Signed)
Had her dog wrap around her and make her fall. Can barely walk, very swollen and painful L foot. Significant concern for fracture. [facility]

## 2019-06-24 NOTE — Telephone Encounter (Signed)
Pt. Reports her dog knocked her down yesterday, injuring her left foot. Foot is bruised and has swelling," More on the left side. " Hurts with weight bearing. No availability with her PCP. Appointment made.  Reason for Disposition . [1] MODERATE pain (e.g., interferes with normal activities, limping) AND [2] present > 3 days  Answer Assessment - Initial Assessment Questions 1. ONSET: "When did the pain start?"      Yesterday 2. LOCATION: "Where is the pain located?"      Left side 3. PAIN: "How bad is the pain?"    (Scale 1-10; or mild, moderate, severe)   -  MILD (1-3): doesn't interfere with normal activities    -  MODERATE (4-7): interferes with normal activities (e.g., work or school) or awakens from sleep, limping    -  SEVERE (8-10): excruciating pain, unable to do any normal activities, unable to walk     With walking - 9 4. WORK OR EXERCISE: "Has there been any recent work or exercise that involved this part of the body?"      No 5. CAUSE: "What do you think is causing the foot pain?"     Fell yesterday 6. OTHER SYMPTOMS: "Do you have any other symptoms?" (e.g., leg pain, rash, fever, numbness)     No 7. PREGNANCY: "Is there any chance you are pregnant?" "When was your last menstrual period?"     No  Protocols used: FOOT PAIN-A-AH

## 2019-07-10 IMAGING — DX DG KNEE COMPLETE 4+V*L*
5 series · 5 of 5 positions shown · non-contrast
Comparison: None in PACs

CLINICAL DATA: Several week history of left knee pain predominantly
laterally with increased severity last week. No known injury. There
is mild swelling.

EXAM:
LEFT KNEE - COMPLETE 4+ VIEW

[knee lat (1 of 2)]
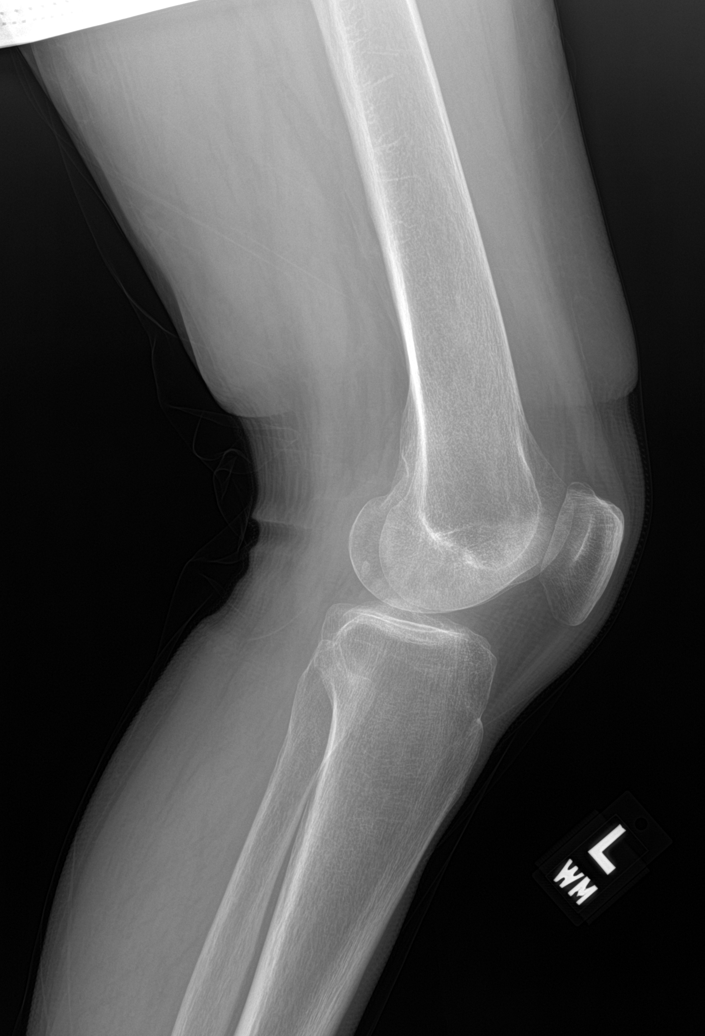

[knee obl (1 of 2)]
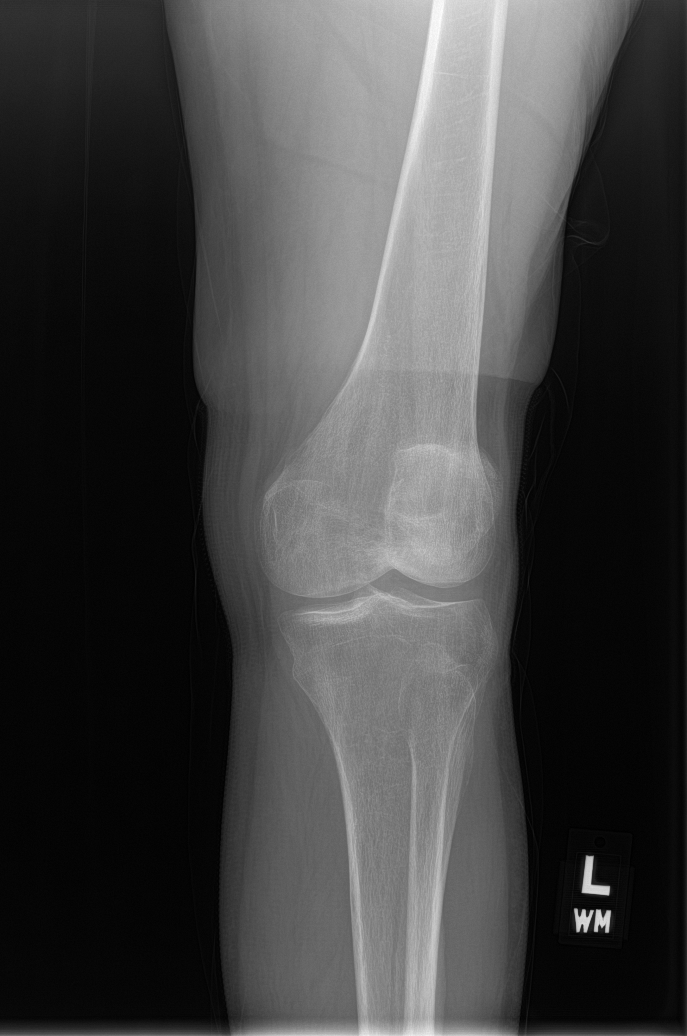

[knee obl (2 of 2)]
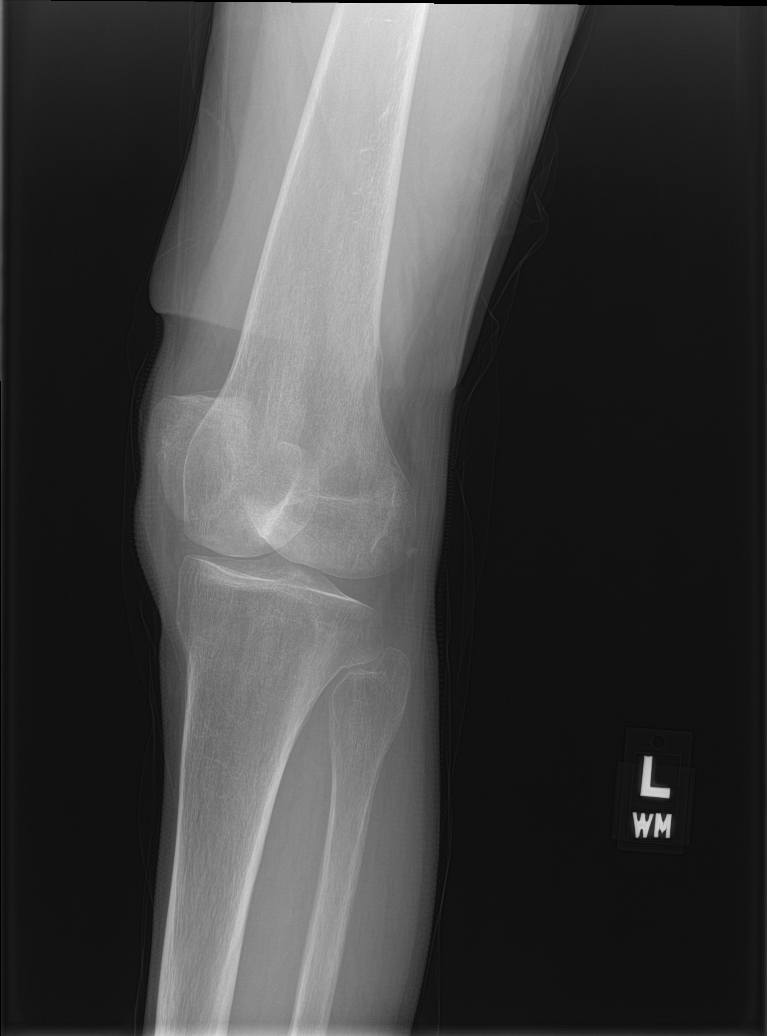

[knee ap]
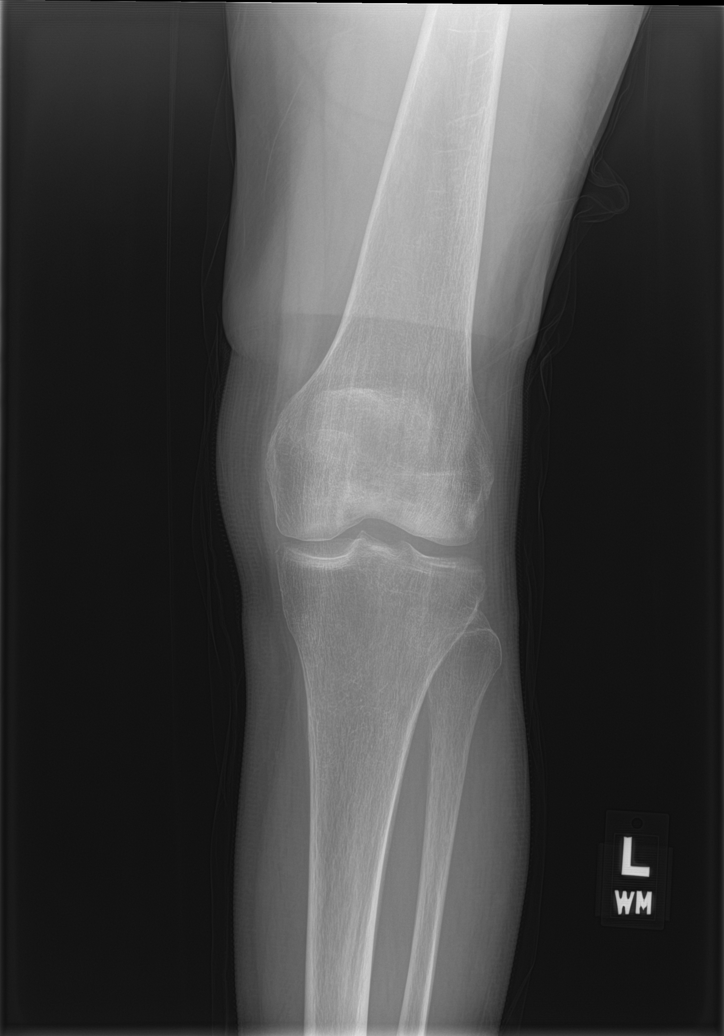

[knee lat (2 of 2)]
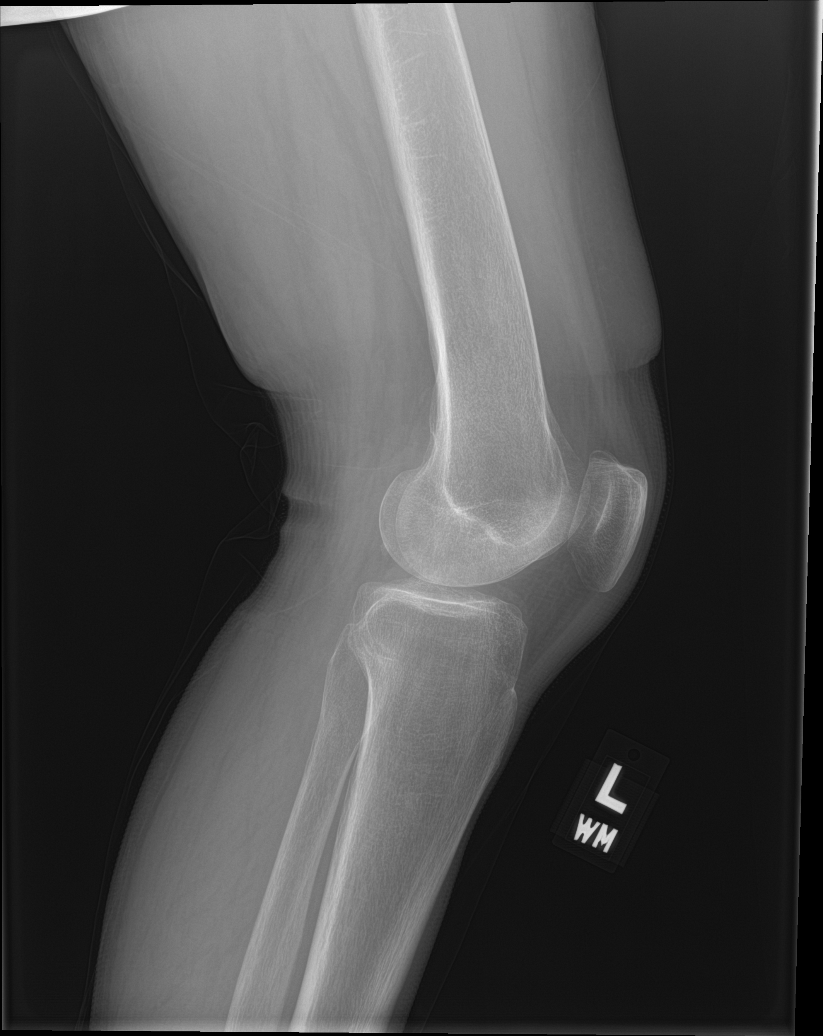

[5 of 5 positions shown; findings below may reference images not displayed]

FINDINGS: The bones are subjectively adequately mineralized. There is no acute
fracture or dislocation. The joint spaces are well maintained. There
is mild beaking of the medial tibial spine. There is no joint
effusion. Specific attention to the lateral aspect of the knee
reveals no acute abnormality.
IMPRESSION: There is no acute or significant chronic bony abnormality of the
left knee.

## 2019-07-19 ENCOUNTER — Other Ambulatory Visit: Payer: Self-pay | Admitting: Nurse Practitioner

## 2019-07-19 DIAGNOSIS — F329 Major depressive disorder, single episode, unspecified: Secondary | ICD-10-CM

## 2019-07-19 NOTE — Telephone Encounter (Signed)
Requested medication (s) are due for refill today -yes  Requested medication (s) are on the active medication list -yes  Future visit scheduled -no  Last refill: 04/16/19  Notes to clinic: Protocol for labs fail- sent for review   Requested Prescriptions  Pending Prescriptions Disp Refills   venlafaxine XR (EFFEXOR-XR) 75 MG 24 hr capsule [Pharmacy Med Name: VENLAFAXINE HCL ER 75 MG CAP] 90 capsule 0    Sig: Take 1 capsule (75 mg total) by mouth daily with breakfast.      Psychiatry: Antidepressants - SNRI - desvenlafaxine & venlafaxine Failed - 07/19/2019  8:48 AM      Failed - LDL in normal range and within 360 days    LDL Calculated  Date Value Ref Range Status  06/18/2017 42 0 - 99 mg/dL Final          Failed - Total Cholesterol in normal range and within 360 days    Cholesterol, Total  Date Value Ref Range Status  06/18/2017 160 100 - 199 mg/dL Final   Cholesterol Piccolo, Waived  Date Value Ref Range Status  05/07/2015 176 <200 mg/dL Final    Comment:                            Desirable                <200                         Borderline High      200- 239                         High                     >239           Failed - Triglycerides in normal range and within 360 days    Triglycerides  Date Value Ref Range Status  06/18/2017 102 0 - 149 mg/dL Final   Triglycerides Piccolo,Waived  Date Value Ref Range Status  05/07/2015 119 <150 mg/dL Final    Comment:                            Normal                   <150                         Borderline High     150 - 199                         High                200 - 499                         Very High                >499           Passed - Completed PHQ-2 or PHQ-9 in the last 360 days.      Passed - Last BP in normal range    BP Readings from Last 1 Encounters:  06/24/19 124/78          Passed -  Valid encounter within last 6 months    Recent Outpatient Visits           3 weeks ago Left  foot pain   [place], [place], DO   1 month ago Neoplasm of uncertain behavior   [place], [place], [place]   1 month ago Seborrheic keratosis   [place], [place], [place]   4 months ago Essential hypertension   [place], [place] T, NP   1 year ago Acute non-recurrent maxillary sinusitis   [facility] [place], Henrine Screws T, NP                  Requested Prescriptions  Pending Prescriptions Disp Refills   venlafaxine XR (EFFEXOR-XR) 75 MG 24 hr capsule [Pharmacy Med Name: VENLAFAXINE HCL ER 75 MG CAP] 90 capsule 0    Sig: Take 1 capsule (75 mg total) by mouth daily with breakfast.      Psychiatry: Antidepressants - SNRI - desvenlafaxine & venlafaxine Failed - 07/19/2019  8:48 AM      Failed - LDL in normal range and within 360 days    LDL Calculated  Date Value Ref Range Status  06/18/2017 42 0 - 99 mg/dL Final          Failed - Total Cholesterol in normal range and within 360 days    Cholesterol, Total  Date Value Ref Range Status  06/18/2017 160 100 - 199 mg/dL Final   Cholesterol Piccolo, Waived  Date Value Ref Range Status  05/07/2015 176 <200 mg/dL Final    Comment:                            Desirable                <200                         Borderline High      200- 239                         High                     >239           Failed - Triglycerides in normal range and within 360 days    Triglycerides  Date Value Ref Range Status  06/18/2017 102 0 - 149 mg/dL Final   Triglycerides Piccolo,Waived  Date Value Ref Range Status  05/07/2015 119 <150 mg/dL Final    Comment:                            Normal                   <150                         Borderline High     150 - 199                         High                200 - 499  Very High                >499           Passed - Completed PHQ-2 or PHQ-9 in  the last 360 days.      Passed - Last BP in normal range    BP Readings from Last 1 Encounters:  06/24/19 124/78          Passed - Valid encounter within last 6 months    Recent Outpatient Visits           3 weeks ago Left foot pain   [place], [place], DO   1 month ago Neoplasm of uncertain behavior   [place], [place], [place]   1 month ago Seborrheic keratosis   [place], [place], [place]   4 months ago Essential hypertension   [place], [place] T, NP   1 year ago Acute non-recurrent maxillary sinusitis   [facility] [place], Barbaraann Faster, NP

## 2019-07-19 NOTE — Telephone Encounter (Signed)
Routing to provider  

## 2019-07-19 NOTE — Telephone Encounter (Signed)
Pt has apt on 09/16/2019 pt verbalized understanding.

## 2019-08-10 ENCOUNTER — Encounter: Payer: Self-pay | Admitting: Gastroenterology

## 2019-08-10 ENCOUNTER — Ambulatory Visit (INDEPENDENT_AMBULATORY_CARE_PROVIDER_SITE_OTHER): Payer: Medicare Other

## 2019-08-10 VITALS — Ht 61.0 in | Wt 100.0 lb

## 2019-08-10 DIAGNOSIS — Z0000 Encounter for general adult medical examination without abnormal findings: Secondary | ICD-10-CM | POA: Diagnosis not present

## 2019-08-10 DIAGNOSIS — Z1211 Encounter for screening for malignant neoplasm of colon: Secondary | ICD-10-CM

## 2019-08-10 NOTE — Progress Notes (Signed)
Subjective:   Lauren Nolan is a 74 y.o. female who presents for Medicare Annual (Subsequent) preventive examination.  This visit is being conducted via phone call  - after an attmept to do on video chat - due to the COVID-19 pandemic. This patient has given me verbal consent via phone to conduct this visit, patient states they are participating from their home address. Some vital signs may be absent or patient reported.   Patient identification: identified by name, DOB, and current address.    Review of Systems:   Cardiac Risk Factors include: advanced age (>70men, >69 women)     Objective:     Vitals: Ht 5\' 1"  (1.549 m)   Wt 100 lb (45.4 kg)   BMI 18.89 kg/m   Body mass index is 18.89 kg/m.  Advanced Directives 06/07/2018 05/07/2016 05/07/2015  Does Patient Have a Medical Advance Directive? No No No  Would patient like information on creating a medical advance directive? No - Patient declined - -    Tobacco Social History   Tobacco Use  Smoking Status Current Every Day Smoker  . Packs/day: 0.25  . Types: Cigarettes  Smokeless Tobacco Never Used  Tobacco Comment   1 pack every 3 days      Ready to quit: Yes Counseling given: Yes Comment: 1 pack every 3 days    Clinical Intake:  Pre-visit preparation completed: Yes  Pain : No/denies pain     Nutritional Status: BMI <19  Underweight Nutritional Risks: None Diabetes: No  How often do you need to have someone help you when you read instructions, pamphlets, or other written materials from your doctor or pharmacy?: 1 - Never  Interpreter Needed?: No  Information entered by :: Lauren Nevils,LPN  Past Medical History:  Diagnosis Date  . Allergy   . Anxiety   . COPD (chronic obstructive pulmonary disease) ([place])   . Depression   . GERD (gastroesophageal reflux disease)   . Hypertension   . Internal hemorrhoid   . Lichen sclerosus   . Osteoporosis   . Spinal stenosis   . Tobacco use    Past Surgical  History:  Procedure Laterality Date  . BREAST EXCISIONAL BIOPSY Bilateral    1980's - benign  . BREAST SURGERY Bilateral    biopsy  . EYE SURGERY Bilateral Feb-Mar 2016  . HERNIA REPAIR    . TUBAL LIGATION     Family History  Problem Relation Age of Onset  . Dementia Mother   . Anxiety disorder Mother   . Hypertension Mother   . Epilepsy Mother   . Cancer Father        lung  . Diabetes Brother   . Colon cancer Brother   . Hypertension Daughter   . Heart disease Maternal Grandfather        MI  . Hypertension Brother   . Diabetes Daughter   . Arthritis Daughter   . Breast cancer Neg Hx    Social History   Socioeconomic History  . Marital status: Married    Spouse name: Not on file  . Number of children: Not on file  . Years of education: Not on file  . Highest education level: 8th grade  Occupational History  . Occupation: retired  Tobacco Use  . Smoking status: Current Every Day Smoker    Packs/day: 0.25    Types: Cigarettes  . Smokeless tobacco: Never Used  . Tobacco comment: 1 pack every 3 days   Substance and Sexual  Activity  . Alcohol use: Yes    Alcohol/week: 3.0 standard drinks    Types: 3 Glasses of wine per week  . Drug use: No  . Sexual activity: Yes  Other Topics Concern  . Not on file  Social History Narrative   Retired, has daughter living at home with her and her husband    Social Determinants of Radio broadcast assistant Strain:   . Difficulty of Paying Living Expenses:   Food Insecurity:   . Worried About Charity fundraiser in the Last Year:   . Arboriculturist in the Last Year:   Transportation Needs:   . Film/video editor (Medical):   Marland Kitchen Lack of Transportation (Non-Medical):   Physical Activity:   . Days of Exercise per Week:   . Minutes of Exercise per Session:   Stress:   . Feeling of Stress :   Social Connections:   . Frequency of Communication with Friends and Family:   . Frequency of Social Gatherings with Friends and  Family:   . Attends Religious Services:   . Active Member of Clubs or Organizations:   . Attends Archivist Meetings:   Marland Kitchen Marital Status:     Outpatient Encounter Medications as of 08/10/2019  Medication Sig  . albuterol (VENTOLIN HFA) 108 (90 Base) MCG/ACT inhaler Inhale 1-2 puffs into the lungs every 6 (six) hours as needed for wheezing or shortness of breath.  Marland Kitchen alendronate (FOSAMAX) 70 MG tablet Take 1 tablet (70 mg total) by mouth once a week. Take with a full glass of water on an empty stomach.  Marland Kitchen amLODipine (NORVASC) 10 MG tablet Take 1 tablet (10 mg total) by mouth daily.  Marland Kitchen aspirin 81 MG tablet Take 81 mg by mouth daily.  . fluticasone (FLONASE) 50 MCG/ACT nasal spray Place 2 sprays into both nostrils daily.  Marland Kitchen losartan (COZAAR) 100 MG tablet Take 1 tablet (100 mg total) by mouth daily.  Marland Kitchen omeprazole (PRILOSEC) 20 MG capsule Take 20 mg by mouth daily.  Marland Kitchen venlafaxine XR (EFFEXOR-XR) 75 MG 24 hr capsule Take 1 capsule (75 mg total) by mouth daily with breakfast.   No facility-administered encounter medications on file as of 08/10/2019.    Activities of Daily Living In your present state of health, do you have any difficulty performing the following activities: 08/10/2019  Hearing? N  Comment no hearing aids  Vision? N  Comment [facility]  Difficulty concentrating or making decisions? N  Walking or climbing stairs? N  Dressing or bathing? N  Doing errands, shopping? N  Preparing Food and eating ? N  Using the Toilet? N  In the past six months, have you accidently leaked urine? N  Do you have problems with loss of bowel control? N  Managing your Medications? N  Managing your Finances? N  Housekeeping or managing your Housekeeping? N  Some recent data might be hidden    Patient Care Team: Venita Lick, NP as PCP - General (Nurse Practitioner) Leandrew Koyanagi, MD as Referring Physician (Ophthalmology) Arelia Sneddon, OD (Optometry)      Assessment:   This is a routine wellness examination for Lauren Nolan.  Exercise Activities and Dietary recommendations Current Exercise Habits: The patient does not participate in regular exercise at present(outside work), Exercise limited by: None identified  Goals Addressed   None     Fall Risk: Fall Risk  08/10/2019 03/17/2019 06/07/2018 02/15/2018 06/18/2017  Falls in the past year? 1 0  0 0 No  Number falls in past yr: 0 0 - - -  Comment dog knocked her down - - - -  Injury with Fall? 1 0 - - -  Follow up - Falls evaluation completed - - -    FALL RISK PREVENTION PERTAINING TO THE HOME:  Any stairs in or around the home? Yes  steps going in home  If so, are there any without handrails? No   Home free of loose throw rugs in walkways, pet beds, electrical cords, etc? Yes  Adequate lighting in your home to reduce risk of falls? Yes   ASSISTIVE DEVICES UTILIZED TO PREVENT FALLS:  Life alert? No  Use of a cane, walker or w/c? No  Grab bars in the bathroom? No  Shower chair or bench in shower? No  Elevated toilet seat or a handicapped toilet? No   DME ORDERS:  DME order needed?  No   TIMED UP AND GO:  Unable to perform    Depression Screen PHQ 2/9 Scores 08/10/2019 03/17/2019 06/07/2018 04/26/2018  PHQ - 2 Score 0 0 0 0  PHQ- 9 Score - 1 - 1     Cognitive Function     6CIT Screen 06/07/2018  What Year? 0 points  What month? 0 points  What time? 0 points  Count back from 20 0 points  Months in reverse 0 points  Repeat phrase 0 points  Total Score 0    Immunization History  Administered Date(s) Administered  . Fluad Quad(high Dose 65+) 01/07/2019  . Influenza, High Dose Seasonal PF 12/16/2017  . Influenza-Unspecified 01/11/2015, 01/11/2016, 12/31/2016  . Moderna SARS-COVID-2 Vaccination 05/10/2019, 06/08/2019  . Pneumococcal Conjugate-13 08/22/2013  . Pneumococcal Polysaccharide-23 07/05/2014  . Tdap 06/07/2010  . Zoster 04/11/2008  . Zoster Recombinat  (Shingrix) 01/20/2019, 04/25/2019    Qualifies for Shingles Vaccine? Yes  shingrix completed   Tdap: up to date   Flu Vaccine: up to date  Pneumococcal Vaccine: up to date   Covid-19 Vaccine: Completed vaccines  Screening Tests Health Maintenance  Topic Date Due  . COLONOSCOPY  10/14/2015  . MAMMOGRAM  07/05/2020 (Originally 07/11/2019)  . INFLUENZA VACCINE  11/13/2019  . TETANUS/TDAP  06/07/2020  . DEXA SCAN  Completed  . COVID-19 Vaccine  Completed  . Hepatitis C Screening  Completed  . PNA vac Low Risk Adult  Completed    Cancer Screenings:  Colorectal Screening: Completed 2007. Repeat every 10 years. Has family hx.  Mammogram: Completed 2019. Repeat every year; declined this year.   Bone Density: Completed 2016.  Lung Cancer Screening: (Low Dose CT Chest recommended if Age 64-80 years, 30 pack-year currently smoking OR have quit w/in 15years.) does not qualify.     Additional Screening:  Hepatitis C Screening: does qualify; Completed 2017  Vision Screening: Recommended annual ophthalmology exams for early detection of glaucoma and other disorders of the eye. Is the patient up to date with their annual eye exam?  Yes  Who is the provider or what is the name of the office in which the pt attends annual eye exams? [facility]    Dental Screening: Recommended annual dental exams for proper oral hygiene  Community Resource Referral:  CRR required this visit?  No       Plan:  I have personally reviewed and addressed the Medicare Annual Wellness questionnaire and have noted the following in the patient's chart:  A. Medical and social history B. Use of alcohol, tobacco or illicit drugs  C. Current medications and supplements D. Functional ability and status E.  Nutritional status F.  Physical activity G. Advance directives H. List of other physicians I.  Hospitalizations, surgeries, and ER visits in previous 12 months J.  [place] such  as hearing and vision if needed, cognitive and depression L. Referrals and appointments   In addition, I have reviewed and discussed with patient certain preventive protocols, quality metrics, and best practice recommendations. A written personalized care plan for preventive services as well as general preventive health recommendations were provided to patient.  Signed,    Bevelyn Ngo, LPN  [phone] Nurse Health Advisor   Nurse Notes: patients husband mentioned when she is sleeping she seems to be getting chocked up on her saliva. Patient states she gets in a very deep sleep and doesn't usually wake or notice it. Patients husband states she doesn't seem to have difficulty with breathing and once she readjusts she is fine. Discussed with patient trying a wedge pillow as she sleeps flat on her back, and can discuss with Jolene about sleep study if needed.

## 2019-08-10 NOTE — Addendum Note (Signed)
Addended by: Marnee Guarneri T on: 08/10/2019 04:38 PM   Modules accepted: Orders

## 2019-08-10 NOTE — Patient Instructions (Signed)
Lauren Nolan , Thank you for taking time to come for your Medicare Wellness Visit. I appreciate your ongoing commitment to your health goals. Please review the following plan we discussed and let me know if I can assist you in the future.   Screening recommendations/referrals: Colonoscopy: referral placed, someone will call to schedule.  Mammogram: declined  Bone Density: up to date  Recommended yearly ophthalmology/optometry visit for glaucoma screening and checkup Recommended yearly dental visit for hygiene and checkup  Vaccinations: Influenza vaccine: up to date  Pneumococcal vaccine: up to date  Tdap vaccine: up to date  Shingles vaccine: up to date    Covid-19: completed   Advanced directives: Advance directive discussed with you today.  Once this is complete please bring a copy in to our office so we can scan it into your chart.  Conditions/risks identified: discussed sleeping with wedge pillow.   Next appointment: Follow up in one year for your annual wellness visit.    Preventive Care 88 Years and Older, Female Preventive care refers to lifestyle choices and visits with your health care provider that can promote health and wellness. What does preventive care include?  A yearly physical exam. This is also called an annual well check.  Dental exams once or twice a year.  Routine eye exams. Ask your health care provider how often you should have your eyes checked.  Personal lifestyle choices, including:  Daily care of your teeth and gums.  Regular physical activity.  Eating a healthy diet.  Avoiding tobacco and drug use.  Limiting alcohol use.  Practicing safe sex.  Taking low-dose aspirin every day.  Taking vitamin and mineral supplements as recommended by your health care provider. What happens during an annual well check? The services and screenings done by your health care provider during your annual well check will depend on your age, overall health,  lifestyle risk factors, and family history of disease. Counseling  Your health care provider may ask you questions about your:  Alcohol use.  Tobacco use.  Drug use.  Emotional well-being.  Home and relationship well-being.  Sexual activity.  Eating habits.  History of falls.  Memory and ability to understand (cognition).  Work and work Statistician.  Reproductive health. Screening  You may have the following tests or measurements:  Height, weight, and BMI.  Blood pressure.  Lipid and cholesterol levels. These may be checked every 5 years, or more frequently if you are over 63 years old.  Skin check.  Lung cancer screening. You may have this screening every year starting at age 62 if you have a 30-pack-year history of smoking and currently smoke or have quit within the past 15 years.  Fecal occult blood test (FOBT) of the stool. You may have this test every year starting at age 11.  Flexible sigmoidoscopy or colonoscopy. You may have a sigmoidoscopy every 5 years or a colonoscopy every 10 years starting at age 34.  Hepatitis C blood test.  Hepatitis B blood test.  Sexually transmitted disease (STD) testing.  Diabetes screening. This is done by checking your blood sugar (glucose) after you have not eaten for a while (fasting). You may have this done every 1-3 years.  Bone density scan. This is done to screen for osteoporosis. You may have this done starting at age 62.  Mammogram. This may be done every 1-2 years. Talk to your health care provider about how often you should have regular mammograms. Talk with your health care provider about your  test results, treatment options, and if necessary, the need for more tests. Vaccines  Your health care provider may recommend certain vaccines, such as:  Influenza vaccine. This is recommended every year.  Tetanus, diphtheria, and acellular pertussis (Tdap, Td) vaccine. You may need a Td booster every 10 years.  Zoster  vaccine. You may need this after age 60.  Pneumococcal 13-valent conjugate (PCV13) vaccine. One dose is recommended after age 3.  Pneumococcal polysaccharide (PPSV23) vaccine. One dose is recommended after age 57. Talk to your health care provider about which screenings and vaccines you need and how often you need them. This information is not intended to replace advice given to you by your health care provider. Make sure you discuss any questions you have with your health care provider. Document Released: 04/27/2015 Document Revised: 12/19/2015 Document Reviewed: 01/30/2015 Elsevier Interactive Patient Education  2017 [employer] Prevention in the Home Falls can cause injuries. They can happen to people of all ages. There are many things you can do to make your home safe and to help prevent falls. What can I do on the outside of my home?  Regularly fix the edges of walkways and driveways and fix any cracks.  Remove anything that might make you trip as you walk through a door, such as a raised step or threshold.  Trim any bushes or trees on the path to your home.  Use bright outdoor lighting.  Clear any walking paths of anything that might make someone trip, such as rocks or tools.  Regularly check to see if handrails are loose or broken. Make sure that both sides of any steps have handrails.  Any raised decks and porches should have guardrails on the edges.  Have any leaves, snow, or ice cleared regularly.  Use sand or salt on walking paths during winter.  Clean up any spills in your garage right away. This includes oil or grease spills. What can I do in the bathroom?  Use night lights.  Install grab bars by the toilet and in the tub and shower. Do not use towel bars as grab bars.  Use non-skid mats or decals in the tub or shower.  If you need to sit down in the shower, use a plastic, non-slip stool.  Keep the floor dry. Clean up any water that spills on the  floor as soon as it happens.  Remove soap buildup in the tub or shower regularly.  Attach bath mats securely with double-sided non-slip rug tape.  Do not have throw rugs and other things on the floor that can make you trip. What can I do in the bedroom?  Use night lights.  Make sure that you have a light by your bed that is easy to reach.  Do not use any sheets or blankets that are too big for your bed. They should not hang down onto the floor.  Have a firm chair that has side arms. You can use this for support while you get dressed.  Do not have throw rugs and other things on the floor that can make you trip. What can I do in the kitchen?  Clean up any spills right away.  Avoid walking on wet floors.  Keep items that you use a lot in easy-to-reach places.  If you need to reach something above you, use a strong step stool that has a grab bar.  Keep electrical cords out of the way.  Do not use floor polish or wax that  makes floors slippery. If you must use wax, use non-skid floor wax.  Do not have throw rugs and other things on the floor that can make you trip. What can I do with my stairs?  Do not leave any items on the stairs.  Make sure that there are handrails on both sides of the stairs and use them. Fix handrails that are broken or loose. Make sure that handrails are as long as the stairways.  Check any carpeting to make sure that it is firmly attached to the stairs. Fix any carpet that is loose or worn.  Avoid having throw rugs at the top or bottom of the stairs. If you do have throw rugs, attach them to the floor with carpet tape.  Make sure that you have a light switch at the top of the stairs and the bottom of the stairs. If you do not have them, ask someone to add them for you. What else can I do to help prevent falls?  Wear shoes that:  Do not have high heels.  Have rubber bottoms.  Are comfortable and fit you well.  Are closed at the toe. Do not wear  sandals.  If you use a stepladder:  Make sure that it is fully opened. Do not climb a closed stepladder.  Make sure that both sides of the stepladder are locked into place.  Ask someone to hold it for you, if possible.  Clearly mark and make sure that you can see:  Any grab bars or handrails.  First and last steps.  Where the edge of each step is.  Use tools that help you move around (mobility aids) if they are needed. These include:  Canes.  Walkers.  Scooters.  Crutches.  Turn on the lights when you go into a dark area. Replace any light bulbs as soon as they burn out.  Set up your furniture so you have a clear path. Avoid moving your furniture around.  If any of your floors are uneven, fix them.  If there are any pets around you, be aware of where they are.  Review your medicines with your doctor. Some medicines can make you feel dizzy. This can increase your chance of falling. Ask your doctor what other things that you can do to help prevent falls. This information is not intended to replace advice given to you by your health care provider. Make sure you discuss any questions you have with your health care provider. Document Released: 01/25/2009 Document Revised: 09/06/2015 Document Reviewed: 05/05/2014 Elsevier Interactive Patient Education  2017 [employer].

## 2019-09-16 ENCOUNTER — Other Ambulatory Visit: Payer: Self-pay

## 2019-09-16 ENCOUNTER — Ambulatory Visit (INDEPENDENT_AMBULATORY_CARE_PROVIDER_SITE_OTHER): Payer: Medicare Other | Admitting: Nurse Practitioner

## 2019-09-16 ENCOUNTER — Encounter: Payer: Self-pay | Admitting: Nurse Practitioner

## 2019-09-16 VITALS — BP 114/71 | HR 94 | Temp 98.3°F | Ht 61.0 in | Wt 103.0 lb

## 2019-09-16 DIAGNOSIS — F329 Major depressive disorder, single episode, unspecified: Secondary | ICD-10-CM

## 2019-09-16 DIAGNOSIS — Z1211 Encounter for screening for malignant neoplasm of colon: Secondary | ICD-10-CM

## 2019-09-16 DIAGNOSIS — M25512 Pain in left shoulder: Secondary | ICD-10-CM

## 2019-09-16 DIAGNOSIS — M810 Age-related osteoporosis without current pathological fracture: Secondary | ICD-10-CM | POA: Diagnosis not present

## 2019-09-16 DIAGNOSIS — I10 Essential (primary) hypertension: Secondary | ICD-10-CM | POA: Diagnosis not present

## 2019-09-16 DIAGNOSIS — F17210 Nicotine dependence, cigarettes, uncomplicated: Secondary | ICD-10-CM

## 2019-09-16 DIAGNOSIS — F411 Generalized anxiety disorder: Secondary | ICD-10-CM | POA: Diagnosis not present

## 2019-09-16 DIAGNOSIS — Z1231 Encounter for screening mammogram for malignant neoplasm of breast: Secondary | ICD-10-CM

## 2019-09-16 MED ORDER — LOSARTAN POTASSIUM 100 MG PO TABS: 100.0000 mg | ORAL_TABLET | Freq: Every day | ORAL | 4 refills | Status: DC

## 2019-09-16 MED ORDER — VENLAFAXINE HCL ER 75 MG PO CP24: 75.0000 mg | ORAL_CAPSULE | Freq: Every day | ORAL | 4 refills | Status: DC

## 2019-09-16 MED ORDER — ALENDRONATE SODIUM 70 MG PO TABS: 70.0000 mg | ORAL_TABLET | ORAL | 3 refills | Status: DC

## 2019-09-16 MED ORDER — AMLODIPINE BESYLATE 10 MG PO TABS: 10.0000 mg | ORAL_TABLET | Freq: Every day | ORAL | 4 refills | Status: DC

## 2019-09-16 MED ORDER — ALBUTEROL SULFATE HFA 108 (90 BASE) MCG/ACT IN AERS: INHALATION_SPRAY | RESPIRATORY_TRACT | 3 refills | Status: DC

## 2019-09-16 NOTE — Progress Notes (Signed)
BP 114/71 (BP Location: Left Arm, Patient Position: Sitting, Cuff Size: Normal)   Pulse 94   Temp 98.3 F (36.8 C) (Oral)   Ht 5\' 1"  (1.549 m)   Wt 103 lb (46.7 kg)   SpO2 96%   BMI 19.46 kg/m    Subjective:    Patient ID: Lauren Nolan, female    DOB: 04-Aug-1945, 74 y.o.   MRN: [id number]  HPI: Lauren Nolan is a 74 y.o. female  Chief Complaint  Patient presents with  . Depression    Requesting refill for Effexor.  Marland Kitchen Anxiety  . Shoulder Pain   DEPRESSION/ANXIETY Continues on Effexor 75 MG.  Denies SI/HI. Mood status: stable Satisfied with current treatment?: yes Symptom severity: mild  Duration of current treatment : chronic Side effects: no Medication compliance: good compliance Psychotherapy/counseling: none Previous psychiatric medications: Effexor Depressed mood: no Anxious mood: occasionally Anhedonia: no Significant weight loss or gain: no Insomnia: none Fatigue: no Feelings of worthlessness or guilt: no Impaired concentration/indecisiveness: occasionally Suicidal ideations: no Hopelessness: no Crying spells: no GAD 7 : Generalized Anxiety Score 09/16/2019 04/26/2018 02/15/2018  Nervous, Anxious, on Edge 1 1 1   Control/stop worrying 0 0 1  Worry too much - different things 0 0 0  Trouble relaxing 0 0 0  Restless 0 0 0  Easily annoyed or irritable 0 0 0  Afraid - awful might happen 0 0 0  Total GAD 7 Score 1 1 2   Anxiety Difficulty Not difficult at all Not difficult at all -   HYPERTENSION Continues on Amlodipine and Losartan, ASA.  Continues to smoke, 1/2 PPD, not interested in quitting.   Has been smoking since she was age 62.   Hypertension status: stable  Satisfied with current treatment? yes Duration of hypertension: chronic BP monitoring frequency:  rarely BP range: 110-120/70 BP medication side effects:  no Medication compliance: good compliance Aspirin: yes Recurrent headaches: no Visual changes: no Palpitations: no Dyspnea: no Chest  pain: no Lower extremity edema: no Dizzy/lightheaded: no  OSTEOPOROSIS Last DEXA in 2016 -- T score -3.7 and is taking Fosamax.   Satisfied with current treatment?: yes Medication side effects: no Medication compliance: good compliance Past osteoporosis medications/treatments: none Adequate calcium & vitamin D: yes Intolerance to bisphosphonates:no Weight bearing exercises: yes  SHOULDER PAIN Started about one month ago to left shoulder, reports having a little knot to right shoulder but no pain.  Has not taken anything at home for left shoulder pain, it is intermittent. Duration: chronic Involved shoulder: left Mechanism of injury: unknown Location: lateral Onset:gradual Severity: 10/10  Quality:  sharp, aching and throbbing Frequency: intermittent Radiation: no Aggravating factors: lifting and movement  Alleviating factors: nothing  Status: fluctuating Treatments attempted: none  Relief with NSAIDs?:  No NSAIDs Taken Weakness: no Numbness: no Decreased grip strength: no Redness: no Swelling: no Bruising: no Fevers: no  Relevant past medical, surgical, family and social history reviewed and updated as indicated. Interim medical history since our last visit reviewed. Allergies and medications reviewed and updated.  Review of Systems  Constitutional: Negative for activity change, appetite change, diaphoresis, fatigue and fever.  Respiratory: Negative for cough, chest tightness and shortness of breath.   Cardiovascular: Negative for chest pain, palpitations and leg swelling.  Gastrointestinal: Negative.   Endocrine: Negative for cold intolerance and heat intolerance.  Musculoskeletal: Positive for arthralgias.  Neurological: Negative.   Psychiatric/Behavioral: Negative.     Per HPI unless specifically indicated above     Objective:  BP 114/71 (BP Location: Left Arm, Patient Position: Sitting, Cuff Size: Normal)   Pulse 94   Temp 98.3 F (36.8 C) (Oral)    Ht 5\' 1"  (1.549 m)   Wt 103 lb (46.7 kg)   SpO2 96%   BMI 19.46 kg/m   Wt Readings from Last 3 Encounters:  09/16/19 103 lb (46.7 kg)  08/10/19 100 lb (45.4 kg)  05/27/19 102 lb (46.3 kg)    Physical Exam Vitals and nursing note reviewed.  Constitutional:      General: She is awake. She is not in acute distress.    Appearance: She is well-developed and well-groomed. She is not ill-appearing.  HENT:     Head: Normocephalic.     Right Ear: Hearing normal.     Left Ear: Hearing normal.  Eyes:     General: Lids are normal.        Right eye: No discharge.        Left eye: No discharge.     Conjunctiva/sclera: Conjunctivae normal.     Pupils: Pupils are equal, round, and reactive to light.  Neck:     Thyroid: No thyromegaly.     Vascular: No carotid bruit.  Cardiovascular:     Rate and Rhythm: Normal rate and regular rhythm.     Heart sounds: Normal heart sounds. No murmur. No gallop.   Pulmonary:     Effort: Pulmonary effort is normal. No accessory muscle usage or respiratory distress.     Breath sounds: Normal breath sounds.  Abdominal:     General: Bowel sounds are normal.     Palpations: Abdomen is soft.  Musculoskeletal:     Right shoulder: Normal.     Left shoulder: No swelling, deformity, laceration, tenderness or crepitus. Normal range of motion. Normal strength. Normal pulse.     Cervical back: Normal range of motion and neck supple.     Right lower leg: No edema.     Left lower leg: No edema.     Comments: No deformities noted to bilateral shoulders and full ROM present with discomfort.  Lymphadenopathy:     Cervical: No cervical adenopathy.  Skin:    General: Skin is warm and dry.  Neurological:     Mental Status: She is alert and oriented to person, place, and time.  Psychiatric:        Attention and Perception: Attention normal.        Mood and Affect: Mood normal.        Speech: Speech normal.        Behavior: Behavior normal. Behavior is cooperative.          Thought Content: Thought content normal.     Results for orders placed or performed in visit on 05/27/19  Surgical pathology  Result Value Ref Range   SURGICAL PATHOLOGY      SURGICAL PATHOLOGY CASE: [id number] PATIENT: Skeet Simmer Surgical Pathology Report     Clinical History: strongly suspect seborrheic keratosis (cm)     FINAL MICROSCOPIC DIAGNOSIS:  A. SKIN, UPPER BACK, EXCISION: - Inflamed seborrheic keratosis.   GROSS DESCRIPTION:  Received in formalin is a 1.1 x 0.9 x 0.2 cm tan-gray skin shave with a diffusely roughened surface.  The specimen is inked and 3 representative central sections are submitted in 1 block.  SW 05/31/2019    Final Diagnosis performed by Vicente Males, MD.   Electronically signed 06/01/2019 Technical component performed at [facility]. [facility], [place] [address]  [place], [place] [address].  Professional component performed at [facility], [place] [address] [place], [place] [address].  Immunohistochemistry Technical component (if applicable) was performed at [facility]. [address] [place], [place], [place] [address].   IMMUNOHISTOCHEMISTRY D ISCLAIMER (if applicable): Some of these immunohistochemical stains may have been developed and the performance characteristics determine by [facility]. Some may not have been cleared or approved by the U.S. Food and Drug Administration. The FDA has determined that such clearance or approval is not necessary. This test is used for clinical purposes. It should not be regarded as investigational or for research. This laboratory is certified under the [place] (CLIA-88) as qualified to perform high complexity clinical laboratory testing.  The controls stained appropriately.       Assessment & Plan:   Problem List Items Addressed This Visit      Cardiovascular and Mediastinum    Hypertension - Primary    Chronic, ongoing.  BP at goal in office today and on home readings.  Continue Amlodipine 10 MG and Losartan 100 MG daily + ASA.  BMP, lipid, and TSH today.  Would consider changing Effexor to an SSRI due to effect of Effexor on BP if elevations present.  She is to check BP daily at home and document.  Recommend complete cessation of smoking and focus on DASH diet.  Return to office in 6 months for physical.      Relevant Medications   losartan (COZAAR) 100 MG tablet   amLODipine (NORVASC) 10 MG tablet   Other Relevant Orders   Basic metabolic panel   Lipid Panel w/o Chol/HDL Ratio   TSH     Musculoskeletal and Integument   Osteoporosis    Ongoing, continue Fosamax and repeat DEXA scan, last was in 2016.  Obtain Vitamin D level today and initiate supplement at home 1000 units Vitamin D3 daily.  May consider period off Fosamax starting at next visit -- has been on for 5 years.      Relevant Medications   alendronate (FOSAMAX) 70 MG tablet   Other Relevant Orders   DG Bone Density   VITAMIN D 25 Hydroxy (Vit-D Deficiency, Fractures)     Other   Generalized anxiety disorder    Chronic, stable.  Denies SI/HI.  Continue Effexor 75 MG daily and consider changing to SSRI in future if elevations in BP present.  Recommend deep breathing techniques and meditation at home.  Return to office in 6 months for physical.      Relevant Medications   venlafaxine XR (EFFEXOR-XR) 75 MG 24 hr capsule   Depression    Chronic, stable.  Denies SI/HI.  Continue Effexor 75 MG daily as is beneficial at this time, could consider change to SSRI if elevation in BP ever presents.  At this time return in 6 months for annual physical, sooner if worsening mood.      Relevant Medications   venlafaxine XR (EFFEXOR-XR) 75 MG 24 hr capsule   Nicotine dependence, cigarettes, uncomplicated    I have recommended complete cessation of tobacco use. I have discussed various options available for  assistance with tobacco cessation including over the counter methods (Nicotine gum, patch and lozenges). We also discussed prescription options (Chantix, Nicotine Inhaler / Nasal Spray). The patient is not interested in pursuing any prescription tobacco cessation options at this time.  Recommended CT lung screening and provided pamphlet to her on this, she wishes to read more  about it first.       Shoulder pain    Present x one month intermittent to left shoulder.  Normal exam today without discomfort.  Suspect arthritic changes.  Recommend use of Tylenol at home as needed for discomfort and may alternate heat/ice.  Use of OTC creams such as Voltaren gel or Icy/Hot lidocaine patches as needed.  For worsening pain consider imaging and referral to PT.  Return to office for worsening or ongoing pain.         Other Visit Diagnoses    Encounter for screening mammogram for malignant neoplasm of breast       Mammogram ordered   Relevant Orders   MM DIGITAL SCREENING BILATERAL   Colon cancer screening       GI referral placed   Relevant Orders   Ambulatory referral to Gastroenterology       Follow up plan: Return in about 6 months (around 03/17/2020) for Annual physical with spirometry.

## 2019-09-16 NOTE — Assessment & Plan Note (Signed)
Chronic, stable.  Denies SI/HI.  Continue Effexor 75 MG daily and consider changing to SSRI in future if elevations in BP present.  Recommend deep breathing techniques and meditation at home.  Return to office in 6 months for physical.

## 2019-09-16 NOTE — Assessment & Plan Note (Signed)
Ongoing, continue Fosamax and repeat DEXA scan, last was in 2016.  Obtain Vitamin D level today and initiate supplement at home 1000 units Vitamin D3 daily.  May consider period off Fosamax starting at next visit -- has been on for 5 years.

## 2019-09-16 NOTE — Assessment & Plan Note (Signed)
Chronic, stable.  Denies SI/HI.  Continue Effexor 75 MG daily as is beneficial at this time, could consider change to SSRI if elevation in BP ever presents.  At this time return in 6 months for annual physical, sooner if worsening mood. 

## 2019-09-16 NOTE — Patient Instructions (Signed)
CALL TO SCHEDULE BONE DENSITY AND MAMMOGRAM ASAP: [facility]  Address: [address], [place], [place] [address]  Phone: [phone]   Osteoporosis  Osteoporosis happens when your bones get thin and weak. This can cause your bones to break (fracture) more easily. You can do things at home to make your bones stronger. Follow these instructions at home:  Activity  Exercise as told by your doctor. Ask your doctor what activities are safe for you. You should do: ? Exercises that make your muscles work to hold your body weight up (weight-bearing exercises). These include tai chi, yoga, and walking. ? Exercises to make your muscles stronger. One example is lifting weights. Lifestyle  Limit alcohol intake to no more than 1 drink a day for nonpregnant women and 2 drinks a day for men. One drink equals 12 oz of beer, 5 oz of wine, or 1 oz of hard liquor.  Do not use any products that have nicotine or tobacco in them. These include cigarettes and e-cigarettes. If you need help quitting, ask your doctor. Preventing falls  Use tools to help you move around (mobility aids) as needed. These include canes, walkers, scooters, and crutches.  Keep rooms well-lit and free of clutter.  Put away things that could make you trip. These include cords and rugs.  Install safety rails on stairs. Install grab bars in bathrooms.  Use rubber mats in slippery areas, like bathrooms.  Wear shoes that: ? Fit you well. ? Support your feet. ? Have closed toes. ? Have rubber soles or low heels.  Tell your doctor about all of the medicines you are taking. Some medicines can make you more likely to fall. General instructions  Eat plenty of calcium and vitamin D. These nutrients are good for your bones. Good sources of calcium and vitamin D include: ? Some fatty fish, such as salmon and tuna. ? Foods that have calcium and vitamin D added to them (fortified foods). For  example, some breakfast cereals are fortified with calcium and vitamin D. ? Egg yolks. ? Cheese. ? Liver.  Take over-the-counter and prescription medicines only as told by your doctor.  Keep all follow-up visits as told by your doctor. This is important. Contact a doctor if:  You have not been tested (screened) for osteoporosis and you are: ? A woman who is age 65 or older. ? A man who is age 70 or older. Get help right away if:  You fall.  You get hurt. Summary  Osteoporosis happens when your bones get thin and weak.  Weak bones can break (fracture) more easily.  Eat plenty of calcium and vitamin D. These nutrients are good for your bones.  Tell your doctor about all of the medicines that you take. This information is not intended to replace advice given to you by your health care provider. Make sure you discuss any questions you have with your health care provider. Document Revised: 03/13/2017 Document Reviewed: 01/23/2017 Elsevier Patient Education  2020 [employer]  

## 2019-09-16 NOTE — Assessment & Plan Note (Signed)
Present x one month intermittent to left shoulder.  Normal exam today without discomfort.  Suspect arthritic changes.  Recommend use of Tylenol at home as needed for discomfort and may alternate heat/ice.  Use of OTC creams such as Voltaren gel or Icy/Hot lidocaine patches as needed.  For worsening pain consider imaging and referral to PT.  Return to office for worsening or ongoing pain.

## 2019-09-16 NOTE — Assessment & Plan Note (Addendum)
Chronic, ongoing.  BP at goal in office today and on home readings.  Continue Amlodipine 10 MG and Losartan 100 MG daily + ASA.  BMP, lipid, and TSH today.  Would consider changing Effexor to an SSRI due to effect of Effexor on BP if elevations present.  She is to check BP daily at home and document.  Recommend complete cessation of smoking and focus on DASH diet.  Return to office in 6 months for physical.

## 2019-09-16 NOTE — Assessment & Plan Note (Signed)
I have recommended complete cessation of tobacco use. I have discussed various options available for assistance with tobacco cessation including over the counter methods (Nicotine gum, patch and lozenges). We also discussed prescription options (Chantix, Nicotine Inhaler / Nasal Spray). The patient is not interested in pursuing any prescription tobacco cessation options at this time.  Recommended CT lung screening and provided pamphlet to her on this, she wishes to read more about it first.

## 2019-09-17 LAB — BASIC METABOLIC PANEL
BUN/Creatinine Ratio: 14 (ref 12–28)
BUN: 10 mg/dL (ref 8–27)
CO2: 26 mmol/L (ref 20–29)
Calcium: 9.8 mg/dL (ref 8.7–10.3)
Chloride: 98 mmol/L (ref 96–106)
Creatinine, Ser: 0.74 mg/dL (ref 0.57–1.00)
GFR calc Af Amer: 94 mL/min/{1.73_m2} (ref >59)
GFR calc non Af Amer: 81 mL/min/{1.73_m2} (ref >59)
Glucose: 87 mg/dL (ref 65–99)
Potassium: 4.9 mmol/L (ref 3.5–5.2)
Sodium: 142 mmol/L (ref 134–144)

## 2019-09-17 LAB — LIPID PANEL W/O CHOL/HDL RATIO
Cholesterol, Total: 153 mg/dL (ref 100–199)
HDL: 89 mg/dL (ref >39)
LDL Chol Calc (NIH): 49 mg/dL (ref 0–99)
Triglycerides: 79 mg/dL (ref 0–149)
VLDL Cholesterol Cal: 15 mg/dL (ref 5–40)

## 2019-09-17 LAB — VITAMIN D 25 HYDROXY (VIT D DEFICIENCY, FRACTURES): Vit D, 25-Hydroxy: 33.8 ng/mL (ref 30.0–100.0)

## 2019-09-17 LAB — TSH: TSH: 1.8 u[IU]/mL (ref 0.450–4.500)

## 2019-09-18 NOTE — Progress Notes (Signed)
Please let Rashae know her labs have returned and everything continues to look great, including cholesterol levels, thyroid, and kidney function.  Vitamin D level is normal.  Continue current medications and keep up the great work!!  Have a wonderful day!!

## 2019-09-22 ENCOUNTER — Telehealth (INDEPENDENT_AMBULATORY_CARE_PROVIDER_SITE_OTHER): Payer: Self-pay | Admitting: Gastroenterology

## 2019-09-22 ENCOUNTER — Telehealth: Payer: Self-pay

## 2019-09-22 ENCOUNTER — Other Ambulatory Visit: Payer: Self-pay

## 2019-09-22 DIAGNOSIS — Z1211 Encounter for screening for malignant neoplasm of colon: Secondary | ICD-10-CM

## 2019-09-22 MED ORDER — PEG 3350-KCL-NA BICARB-NACL 420 G PO SOLR: 4000.0000 mL | Freq: Once | ORAL | 0 refills | Status: AC

## 2019-09-22 NOTE — Progress Notes (Signed)
Gastroenterology Pre-Procedure Review  Request Date: Monday 10/03/19 Requesting Physician: Dr. Vicente Males  PATIENT REVIEW QUESTIONS: The patient responded to the following health history questions as indicated:    1. Are you having any GI issues? yes (sometimes hard stool, takes suppository when this occurs) Pt thinks she may have had salmonella poison over the [facility].  She went to the doctor about this at that time. Feels better now. 2. Do you have a personal history of Polyps? no 3. Do you have a family history of Colon Cancer or Polyps? no 4. Diabetes Mellitus? no 5. Joint replacements in the past 12 months?no 6. Major health problems in the past 3 months?no 7. Any artificial heart valves, MVP, or defibrillator?no    MEDICATIONS & ALLERGIES:    Patient reports the following regarding taking any anticoagulation/antiplatelet therapy:   Plavix, Coumadin, Eliquis, Xarelto, Lovenox, Pradaxa, Brilinta, or Effient? no Aspirin? Yes 81 mg daily  Patient confirms/reports the following medications:  Current Outpatient Medications  Medication Sig Dispense Refill  . albuterol (VENTOLIN HFA) 108 (90 Base) MCG/ACT inhaler Inhale 1-2 puffs into the lungs every 6 (six) hours as needed for wheezing or shortness of breath. 18 g 3  . alendronate (FOSAMAX) 70 MG tablet Take 1 tablet (70 mg total) by mouth once a week. Take with a full glass of water on an empty stomach. 90 tablet 3  . amLODipine (NORVASC) 10 MG tablet Take 1 tablet (10 mg total) by mouth daily. 90 tablet 4  . aspirin 81 MG tablet Take 81 mg by mouth daily.    . fluticasone (FLONASE) 50 MCG/ACT nasal spray Place 2 sprays into both nostrils daily. 16 g 6  . losartan (COZAAR) 100 MG tablet Take 1 tablet (100 mg total) by mouth daily. 90 tablet 4  . omeprazole (PRILOSEC) 20 MG capsule Take 20 mg by mouth daily.    Marland Kitchen venlafaxine XR (EFFEXOR-XR) 75 MG 24 hr capsule Take 1 capsule (75 mg total) by mouth daily with breakfast. 90 capsule  4   No current facility-administered medications for this visit.    Patient confirms/reports the following allergies:  Allergies  Allergen Reactions  . Midol [Acetaminophen] Hypertension  . Sulfur     No orders of the defined types were placed in this encounter.   AUTHORIZATION INFORMATION Primary Insurance: 1D#: Group #:  Secondary Insurance: 1D#: Group #:  SCHEDULE INFORMATION: Date:  Time: Location:

## 2019-09-22 NOTE — Telephone Encounter (Signed)
Gastroenterology Pre-Procedure Review  Request Date: 10/03/19 Requesting Physician: Dr. Vicente Males  PATIENT REVIEW QUESTIONS: The patient responded to the following health history questions as indicated:    1. Are you having any GI issues? yes (sometimes hard stool-takes a suppository to help) 2. Do you have a personal history of Polyps? no 3. Do you have a family history of Colon Cancer or Polyps? yes (Double first cousin colon cancer, brother colon cancer) 4. Diabetes Mellitus? no 5. Joint replacements in the past 12 months?no 6. Major health problems in the past 3 months?no 7. Any artificial heart valves, MVP, or defibrillator?no    MEDICATIONS & ALLERGIES:    Patient reports the following regarding taking any anticoagulation/antiplatelet therapy:   Plavix, Coumadin, Eliquis, Xarelto, Lovenox, Pradaxa, Brilinta, or Effient? no Aspirin? no  Patient confirms/reports the following medications:  Current Outpatient Medications  Medication Sig Dispense Refill  . albuterol (VENTOLIN HFA) 108 (90 Base) MCG/ACT inhaler Inhale 1-2 puffs into the lungs every 6 (six) hours as needed for wheezing or shortness of breath. 18 g 3  . alendronate (FOSAMAX) 70 MG tablet Take 1 tablet (70 mg total) by mouth once a week. Take with a full glass of water on an empty stomach. 90 tablet 3  . amLODipine (NORVASC) 10 MG tablet Take 1 tablet (10 mg total) by mouth daily. 90 tablet 4  . aspirin 81 MG tablet Take 81 mg by mouth daily.    . fluticasone (FLONASE) 50 MCG/ACT nasal spray Place 2 sprays into both nostrils daily. 16 g 6  . losartan (COZAAR) 100 MG tablet Take 1 tablet (100 mg total) by mouth daily. 90 tablet 4  . omeprazole (PRILOSEC) 20 MG capsule Take 20 mg by mouth daily.    . polyethylene glycol-electrolytes (GAVILYTE-N WITH FLAVOR PACK) 420 g solution Take 4,000 mLs by mouth once for 1 dose. 4000 mL 0  . venlafaxine XR (EFFEXOR-XR) 75 MG 24 hr capsule Take 1 capsule (75 mg total) by mouth daily with  breakfast. 90 capsule 4   No current facility-administered medications for this visit.    Patient confirms/reports the following allergies:  Allergies  Allergen Reactions  . Midol [Acetaminophen] Hypertension  . Sulfur     No orders of the defined types were placed in this encounter.   AUTHORIZATION INFORMATION Primary Insurance: 1D#: Group #:  Secondary Insurance: 1D#: Group #:  SCHEDULE INFORMATION: Date: 10/03/19 Time: Location:[facility]

## 2019-09-29 ENCOUNTER — Other Ambulatory Visit: Payer: Self-pay

## 2019-09-29 ENCOUNTER — Other Ambulatory Visit
Admission: RE | Admit: 2019-09-29 | Discharge: 2019-09-29 | Disposition: A | Discharge status: Home / Self Care | Payer: Medicare Other | Source: Ambulatory Visit | Attending: Gastroenterology | Admitting: Gastroenterology

## 2019-09-29 DIAGNOSIS — Z01812 Encounter for preprocedural laboratory examination: Secondary | ICD-10-CM | POA: Insufficient documentation

## 2019-09-29 DIAGNOSIS — Z20822 Contact with and (suspected) exposure to covid-19: Secondary | ICD-10-CM | POA: Insufficient documentation

## 2019-09-29 LAB — SARS CORONAVIRUS 2 (TAT 6-24 HRS): SARS Coronavirus 2: NEGATIVE

## 2019-10-03 ENCOUNTER — Encounter: Payer: Self-pay | Admitting: Gastroenterology

## 2019-10-03 ENCOUNTER — Ambulatory Visit: Payer: Medicare Other | Admitting: Anesthesiology

## 2019-10-03 ENCOUNTER — Other Ambulatory Visit: Payer: Self-pay

## 2019-10-03 ENCOUNTER — Encounter
Admission: RE | Disposition: A | Discharge status: Home / Self Care | Payer: Self-pay | Source: Home / Self Care | Attending: Gastroenterology

## 2019-10-03 ENCOUNTER — Ambulatory Visit
Admission: RE | Admit: 2019-10-03 | Discharge: 2019-10-03 | Disposition: A | Discharge status: Home / Self Care | Payer: Medicare Other | Attending: Gastroenterology | Admitting: Gastroenterology

## 2019-10-03 DIAGNOSIS — D122 Benign neoplasm of ascending colon: Secondary | ICD-10-CM | POA: Insufficient documentation

## 2019-10-03 DIAGNOSIS — D126 Benign neoplasm of colon, unspecified: Secondary | ICD-10-CM

## 2019-10-03 DIAGNOSIS — K635 Polyp of colon: Secondary | ICD-10-CM | POA: Diagnosis not present

## 2019-10-03 DIAGNOSIS — D123 Benign neoplasm of transverse colon: Secondary | ICD-10-CM | POA: Insufficient documentation

## 2019-10-03 DIAGNOSIS — K219 Gastro-esophageal reflux disease without esophagitis: Secondary | ICD-10-CM | POA: Diagnosis not present

## 2019-10-03 DIAGNOSIS — M810 Age-related osteoporosis without current pathological fracture: Secondary | ICD-10-CM | POA: Diagnosis not present

## 2019-10-03 DIAGNOSIS — Z79899 Other long term (current) drug therapy: Secondary | ICD-10-CM | POA: Insufficient documentation

## 2019-10-03 DIAGNOSIS — D120 Benign neoplasm of cecum: Secondary | ICD-10-CM | POA: Diagnosis not present

## 2019-10-03 DIAGNOSIS — K5730 Diverticulosis of large intestine without perforation or abscess without bleeding: Secondary | ICD-10-CM | POA: Diagnosis not present

## 2019-10-03 DIAGNOSIS — K5790 Diverticulosis of intestine, part unspecified, without perforation or abscess without bleeding: Secondary | ICD-10-CM | POA: Diagnosis not present

## 2019-10-03 DIAGNOSIS — J449 Chronic obstructive pulmonary disease, unspecified: Secondary | ICD-10-CM | POA: Diagnosis not present

## 2019-10-03 DIAGNOSIS — Z7982 Long term (current) use of aspirin: Secondary | ICD-10-CM | POA: Insufficient documentation

## 2019-10-03 DIAGNOSIS — Z1211 Encounter for screening for malignant neoplasm of colon: Secondary | ICD-10-CM | POA: Diagnosis not present

## 2019-10-03 DIAGNOSIS — F419 Anxiety disorder, unspecified: Secondary | ICD-10-CM | POA: Insufficient documentation

## 2019-10-03 DIAGNOSIS — F17210 Nicotine dependence, cigarettes, uncomplicated: Secondary | ICD-10-CM | POA: Diagnosis not present

## 2019-10-03 DIAGNOSIS — I10 Essential (primary) hypertension: Secondary | ICD-10-CM | POA: Insufficient documentation

## 2019-10-03 HISTORY — PX: COLONOSCOPY WITH PROPOFOL: SHX5780

## 2019-10-03 SURGERY — COLONOSCOPY WITH PROPOFOL
Anesthesia: General

## 2019-10-03 MED ORDER — PROPOFOL 500 MG/50ML IV EMUL
INTRAVENOUS | Status: AC
  Filled 2019-10-03: qty 50

## 2019-10-03 MED ORDER — PROPOFOL 10 MG/ML IV BOLUS
INTRAVENOUS | Status: DC | PRN
  Administered 2019-10-03: 60 mg via INTRAVENOUS
  Administered 2019-10-03: 20 mg via INTRAVENOUS
  Administered 2019-10-03: 10 mg via INTRAVENOUS
  Administered 2019-10-03 (×2): 20 mg via INTRAVENOUS

## 2019-10-03 MED ORDER — LIDOCAINE HCL (CARDIAC) PF 100 MG/5ML IV SOSY
PREFILLED_SYRINGE | INTRAVENOUS | Status: DC | PRN
  Administered 2019-10-03: 40 mg via INTRAVENOUS

## 2019-10-03 MED ORDER — PROPOFOL 500 MG/50ML IV EMUL
INTRAVENOUS | Status: DC | PRN
  Administered 2019-10-03: 150 ug/kg/min via INTRAVENOUS

## 2019-10-03 MED ORDER — SODIUM CHLORIDE 0.9 % IV SOLN: INTRAVENOUS | Status: DC

## 2019-10-03 NOTE — Anesthesia Procedure Notes (Signed)
Date/Time: 10/03/2019 10:47 AM Performed by: Doreen Salvage, CRNA Pre-anesthesia Checklist: Patient identified, Emergency Drugs available, Suction available and Patient being monitored Patient Re-evaluated:Patient Re-evaluated prior to induction Oxygen Delivery Method: Nasal cannula Induction Type: IV induction Dental Injury: Teeth and Oropharynx as per pre-operative assessment  Comments: Nasal cannula with etCO2 monitoring

## 2019-10-03 NOTE — Transfer of Care (Signed)
Immediate Anesthesia Transfer of Care Note  Patient: Lauren Nolan  Procedure(s) Performed: Procedure(s): COLONOSCOPY WITH PROPOFOL (N/A)  Patient Location: PACU and Endoscopy Unit  Anesthesia Type:General  Level of Consciousness: sedated  Airway & Oxygen Therapy: Patient Spontanous Breathing and Patient connected to nasal cannula oxygen  Post-op Assessment: Report given to RN and Post -op Vital signs reviewed and stable  Post vital signs: Reviewed and stable  Last Vitals:  Vitals:   10/03/19 0913 10/03/19 1126  BP: 122/79 102/61  Pulse: 87 70  Resp: 16 14  Temp: (!) 35.8 C (!) 36 C  SpO2: 40% 397%    Complications: No apparent anesthesia complications

## 2019-10-03 NOTE — H&P (Signed)
Lauren Bellows, MD [address], [place], [place], [place], [address] [address], [place], [place], [place], [address] Phone: [phone]  Fax: [phone]  Primary Care Physician:  Venita Lick, NP   Pre-Procedure History & Physical: HPI:  Lauren Nolan is a 74 y.o. female is here for an colonoscopy.   Past Medical History:  Diagnosis Date  . Allergy   . Anxiety   . COPD (chronic obstructive pulmonary disease) ([place])   . Depression   . GERD (gastroesophageal reflux disease)   . Hypertension   . Internal hemorrhoid   . Lichen sclerosus   . Osteoporosis   . Spinal stenosis   . Tobacco use     Past Surgical History:  Procedure Laterality Date  . BREAST EXCISIONAL BIOPSY Bilateral    1980's - benign  . BREAST SURGERY Bilateral    biopsy  . EYE SURGERY Bilateral Feb-Mar 2016  . HERNIA REPAIR    . TUBAL LIGATION      Prior to Admission medications   Medication Sig Start Date End Date Taking? Authorizing Provider  albuterol (VENTOLIN HFA) 108 (90 Base) MCG/ACT inhaler Inhale 1-2 puffs into the lungs every 6 (six) hours as needed for wheezing or shortness of breath. 09/16/19  Yes Cannady, Jolene T, NP  amLODipine (NORVASC) 10 MG tablet Take 1 tablet (10 mg total) by mouth daily. 09/16/19  Yes Cannady, Jolene T, NP  aspirin 81 MG tablet Take 81 mg by mouth daily.   Yes [provider]  fluticasone (FLONASE) 50 MCG/ACT nasal spray Place 2 sprays into both nostrils daily. 03/17/19  Yes Cannady, Jolene T, NP  losartan (COZAAR) 100 MG tablet Take 1 tablet (100 mg total) by mouth daily. 09/16/19  Yes Cannady, Jolene T, NP  omeprazole (PRILOSEC) 20 MG capsule Take 20 mg by mouth daily.   Yes [provider]  venlafaxine XR (EFFEXOR-XR) 75 MG 24 hr capsule Take 1 capsule (75 mg total) by mouth daily with breakfast. 09/16/19  Yes Cannady, Jolene T, NP  alendronate (FOSAMAX) 70 MG tablet Take 1 tablet (70 mg total) by mouth once a week. Take with a full glass of  water on an empty stomach. 09/16/19   Marnee Guarneri T, NP    Allergies as of 09/22/2019 - Review Complete 09/22/2019  Allergen Reaction Noted  . Midol [acetaminophen] Hypertension 06/19/2017  . Sulfur  02/27/2015    Family History  Problem Relation Age of Onset  . Dementia Mother   . Anxiety disorder Mother   . Hypertension Mother   . Epilepsy Mother   . Cancer Father        lung  . Diabetes Brother   . Colon cancer Brother   . Hypertension Daughter   . Heart disease Maternal Grandfather        MI  . Hypertension Brother   . Diabetes Daughter   . Arthritis Daughter   . Breast cancer Neg Hx     Social History   Socioeconomic History  . Marital status: Married    Spouse name: Not on file  . Number of children: Not on file  . Years of education: Not on file  . Highest education level: 8th grade  Occupational History  . Occupation: retired  Tobacco Use  . Smoking status: Current Every Day Smoker    Packs/day: 0.25    Types: Cigarettes  . Smokeless tobacco: Never Used  . Tobacco comment: 1 pack every 3 days   Vaping  Use  . Vaping Use: Never used  Substance and Sexual Activity  . Alcohol use: Yes    Alcohol/week: 3.0 standard drinks    Types: 3 Glasses of wine per week  . Drug use: No  . Sexual activity: Yes  Other Topics Concern  . Not on file  Social History Narrative   Retired, has daughter living at home with her and her husband    Social Determinants of Radio broadcast assistant Strain:   . Difficulty of Paying Living Expenses:   Food Insecurity:   . Worried About Charity fundraiser in the Last Year:   . Arboriculturist in the Last Year:   Transportation Needs:   . Film/video editor (Medical):   Marland Kitchen Lack of Transportation (Non-Medical):   Physical Activity:   . Days of Exercise per Week:   . Minutes of Exercise per Session:   Stress:   . Feeling of Stress :   Social Connections:   . Frequency of Communication with Friends and Family:   .  Frequency of Social Gatherings with Friends and Family:   . Attends Religious Services:   . Active Member of Clubs or Organizations:   . Attends Archivist Meetings:   Marland Kitchen Marital Status:   Intimate Partner Violence:   . Fear of Current or Ex-Partner:   . Emotionally Abused:   Marland Kitchen Physically Abused:   . Sexually Abused:     Review of Systems: See HPI, otherwise negative ROS  Physical Exam: BP 122/79   Pulse 87   Temp (!) 96.5 F (35.8 C) (Temporal)   Resp 16   Ht 5\' 1"  (1.549 m)   Wt 45.4 kg   SpO2 98%   BMI 18.89 kg/m  General:   Alert,  pleasant and cooperative in NAD Head:  Normocephalic and atraumatic. Neck:  Supple; no masses or thyromegaly. Lungs:  Clear throughout to auscultation, normal respiratory effort.    Heart:  +S1, +S2, Regular rate and rhythm, No edema. Abdomen:  Soft, nontender and nondistended. Normal bowel sounds, without guarding, and without rebound.   Neurologic:  Alert and  oriented x4;  grossly normal neurologically.  Impression/Plan: Lauren Nolan is here for an colonoscopy to be performed for Screening colonoscopy average risk   Risks, benefits, limitations, and alternatives regarding  colonoscopy have been reviewed with the patient.  Questions have been answered.  All parties agreeable.   Lauren Bellows, MD  10/03/2019, 10:29 AM

## 2019-10-03 NOTE — Op Note (Signed)
[facility] Gastroenterology Patient Name: Lauren Nolan Procedure Date: 10/03/2019 10:27 AM MRN: [id number] Account #: [id number] Date of Birth: Oct 31, 1945 Admit Type: Outpatient Age: 74 Room: [facility] ENDO ROOM 4 Gender: Female Note Status: Finalized Procedure:             Colonoscopy Indications:           Screening for colorectal malignant neoplasm Providers:             Jonathon Bellows MD, MD Referring MD:          Venita Lick., NP Medicines:             Monitored Anesthesia Care Complications:         No immediate complications. Procedure:             Pre-Anesthesia Assessment:                        - Prior to the procedure, a History and Physical was                         performed, and patient medications, allergies and                         sensitivities were reviewed. The patient's tolerance                         of previous anesthesia was reviewed.                        - The risks and benefits of the procedure and the                         sedation options and risks were discussed with the                         patient. All questions were answered and informed                         consent was obtained.                        - ASA Grade Assessment: III - A patient with severe                         systemic disease.                        After obtaining informed consent, the colonoscope was                         passed under direct vision. Throughout the procedure,                         the patient's blood pressure, pulse, and oxygen                         saturations were monitored continuously. The                         Colonoscope was introduced through the anus and  advanced to the the cecum, identified by the                         appendiceal orifice. The colonoscopy was performed                         with ease. The patient tolerated the procedure well.                         The quality of the  bowel preparation was adequate. Findings:      The perianal and digital rectal examinations were normal.      Multiple small-mouthed diverticula were found in the sigmoid colon.      Two sessile polyps were found in the ascending colon and cecum. The       polyps were 5 to 8 mm in size. These polyps were removed with a cold       snare. Resection and retrieval were complete.      A 12 mm polyp was found in the transverse colon. The polyp was sessile.       The polyp was removed with a cold snare. Resection and retrieval were       complete. To repair the defect, the tissue edges were approximated and       six hemostatic clips were successfully placed. Closure of the defect was       successful. There was no bleeding during, or at the end, of the       procedure.      The exam was otherwise without abnormality on direct and retroflexion       views. Impression:            - Diverticulosis in the sigmoid colon.                        - Two 5 to 8 mm polyps in the ascending colon and in                         the cecum, removed with a cold snare. Resected and                         retrieved.                        - One 12 mm polyp in the transverse colon, removed                         with a cold snare. Resected and retrieved. Clips were                         placed.                        - The examination was otherwise normal on direct and                         retroflexion views. Recommendation:        - Discharge patient to home (with escort).                        - Resume  previous diet.                        - Continue present medications.                        - Await pathology results.                        - Repeat colonoscopy for surveillance based on                         pathology results. Procedure Code(s):     --- Professional ---                        [phone], Colonoscopy, flexible; with removal of                         tumor(s), polyp(s), or other  lesion(s) by snare                         technique Diagnosis Code(s):     --- Professional ---                        K63.5, Polyp of colon                        Z12.11, Encounter for screening for malignant neoplasm                         of colon                        K57.30, Diverticulosis of large intestine without                         perforation or abscess without bleeding CPT copyright 2019 American Medical Association. All rights reserved. The codes documented in this report are preliminary and upon coder review may  be revised to meet current compliance requirements. Jonathon Bellows, MD Jonathon Bellows MD, MD 10/03/2019 11:25:26 AM This report has been signed electronically. Number of Addenda: 0 Note Initiated On: 10/03/2019 10:27 AM Scope Withdrawal Time: 0 hours 24 minutes 51 seconds  Total Procedure Duration: 0 hours 30 minutes 26 seconds  Estimated Blood Loss:  Estimated blood loss: none.      [facility]

## 2019-10-03 NOTE — Anesthesia Preprocedure Evaluation (Signed)
Anesthesia Evaluation  Patient identified by MRN, date of birth, ID band Patient awake    Reviewed: Allergy & Precautions, H&P , NPO status , Patient's Chart, lab work & pertinent test results, reviewed documented beta blocker date and time   History of Anesthesia Complications Negative for: history of anesthetic complications  Airway Mallampati: I  TM Distance: >3 FB Neck ROM: full    Dental  (+) Dental Advidsory Given, Upper Dentures, Edentulous Upper   Pulmonary neg shortness of breath, neg sleep apnea, COPD,  COPD inhaler, neg recent URI, Current Smoker and Patient abstained from smoking.,    Pulmonary exam normal breath sounds clear to auscultation       Cardiovascular Exercise Tolerance: Good hypertension, (-) angina(-) Past MI and (-) Cardiac Stents Normal cardiovascular exam(-) dysrhythmias (-) Valvular Problems/Murmurs Rhythm:regular Rate:Normal     Neuro/Psych PSYCHIATRIC DISORDERS Anxiety Depression negative neurological ROS     GI/Hepatic Neg liver ROS, GERD  ,  Endo/Other  negative endocrine ROS  Renal/GU negative Renal ROS  negative genitourinary   Musculoskeletal   Abdominal   Peds  Hematology negative hematology ROS (+)   Anesthesia Other Findings Past Medical History: No date: Allergy No date: Anxiety No date: COPD (chronic obstructive pulmonary disease) ([facility]) No date: Depression No date: GERD (gastroesophageal reflux disease) No date: Hypertension No date: Internal hemorrhoid No date: Lichen sclerosus No date: Osteoporosis No date: Spinal stenosis No date: Tobacco use   Reproductive/Obstetrics negative OB ROS                             Anesthesia Physical Anesthesia Plan  ASA: II  Anesthesia Plan: General   Post-op Pain Management:    Induction: Intravenous  PONV Risk Score and Plan: 2 and Propofol infusion and TIVA  Airway Management Planned: Natural  Airway and Nasal Cannula  Additional Equipment:   Intra-op Plan:   Post-operative Plan:   Informed Consent: I have reviewed the patients History and Physical, chart, labs and discussed the procedure including the risks, benefits and alternatives for the proposed anesthesia with the patient or authorized representative who has indicated his/her understanding and acceptance.     Dental Advisory Given  Plan Discussed with: Anesthesiologist, CRNA and Surgeon  Anesthesia Plan Comments:         Anesthesia Quick Evaluation

## 2019-10-03 NOTE — Anesthesia Postprocedure Evaluation (Signed)
Anesthesia Post Note  Patient: Lauren Nolan  Procedure(s) Performed: COLONOSCOPY WITH PROPOFOL (N/A )  Patient location during evaluation: Endoscopy Anesthesia Type: General Level of consciousness: awake and alert Pain management: pain level controlled Vital Signs Assessment: post-procedure vital signs reviewed and stable Respiratory status: spontaneous breathing, nonlabored ventilation, respiratory function stable and patient connected to nasal cannula oxygen Cardiovascular status: blood pressure returned to baseline and stable Postop Assessment: no apparent nausea or vomiting Anesthetic complications: no   No complications documented.   Last Vitals:  Vitals:   10/03/19 1200 10/03/19 1209  BP:  140/80  Pulse: 60 62  Resp: 17 18  Temp:    SpO2: 96% 96%    Last Pain:  Vitals:   10/03/19 1126  TempSrc: Tympanic  PainSc:                  Martha Clan

## 2019-10-04 ENCOUNTER — Encounter: Payer: Self-pay | Admitting: Gastroenterology

## 2019-10-04 LAB — SURGICAL PATHOLOGY

## 2019-10-10 ENCOUNTER — Encounter: Payer: Medicare Other | Admitting: Gastroenterology

## 2019-10-11 ENCOUNTER — Telehealth: Payer: Self-pay | Admitting: Nurse Practitioner

## 2019-10-11 NOTE — Telephone Encounter (Signed)
Patient notified of Jolene's message.   °

## 2019-10-11 NOTE — Telephone Encounter (Signed)
Copied from [place] [id number]. Topic: Quick Communication - Other Results (Clinic Use ONLY) >> Oct 11, 2019 11:40 AM Lennox Solders wrote: Pt had colonoscopy on 10-03-2019 and pt would like to know the results

## 2019-10-11 NOTE — Telephone Encounter (Signed)
Can we let the patient know anything on this?

## 2019-10-11 NOTE — Telephone Encounter (Signed)
I would recommend she call GI so they can go over those results with her and she should also receive a letter in mail.

## 2019-10-18 ENCOUNTER — Encounter: Payer: Self-pay | Admitting: Gastroenterology

## 2020-03-18 ENCOUNTER — Encounter: Payer: Self-pay | Admitting: Nurse Practitioner

## 2020-03-19 ENCOUNTER — Encounter: Payer: Self-pay | Admitting: Nurse Practitioner

## 2020-03-19 ENCOUNTER — Ambulatory Visit (INDEPENDENT_AMBULATORY_CARE_PROVIDER_SITE_OTHER): Payer: Medicare Other | Admitting: Nurse Practitioner

## 2020-03-19 ENCOUNTER — Other Ambulatory Visit: Payer: Self-pay

## 2020-03-19 VITALS — BP 132/77 | HR 98 | Temp 98.2°F | Ht 60.75 in | Wt 101.4 lb

## 2020-03-19 DIAGNOSIS — Z0000 Encounter for general adult medical examination without abnormal findings: Secondary | ICD-10-CM

## 2020-03-19 DIAGNOSIS — Z1329 Encounter for screening for other suspected endocrine disorder: Secondary | ICD-10-CM | POA: Diagnosis not present

## 2020-03-19 DIAGNOSIS — F17210 Nicotine dependence, cigarettes, uncomplicated: Secondary | ICD-10-CM

## 2020-03-19 DIAGNOSIS — Z1231 Encounter for screening mammogram for malignant neoplasm of breast: Secondary | ICD-10-CM

## 2020-03-19 DIAGNOSIS — Z13220 Encounter for screening for lipoid disorders: Secondary | ICD-10-CM

## 2020-03-19 DIAGNOSIS — M810 Age-related osteoporosis without current pathological fracture: Secondary | ICD-10-CM

## 2020-03-19 MED ORDER — AMLODIPINE BESYLATE 10 MG PO TABS: 10.0000 mg | ORAL_TABLET | Freq: Every day | ORAL | 4 refills | Status: DC

## 2020-03-19 NOTE — Progress Notes (Signed)
BP 132/77   Pulse 98   Temp 98.2 F (36.8 C)   Ht 5' 0.75" (1.543 m)   Wt 101 lb 6.4 oz (46 kg)   SpO2 95%   BMI 19.32 kg/m    Subjective:    Patient ID: Lauren Nolan, female    DOB: [birth year], 74 y.o.   MRN: [id number]  HPI: Lauren Nolan is a 74 y.o. female presenting on 03/19/2020 for comprehensive medical examination. Current medical complaints include:none  She currently lives with: husband Menopausal Symptoms: no  Depression Screen done today and results listed below:  Depression screen [facility] 2/9 03/19/2020 09/16/2019 08/10/2019 03/17/2019 06/07/2018  Decreased Interest 0 0 0 0 0  Down, Depressed, Hopeless 0 0 0 0 0  PHQ - 2 Score 0 0 0 0 0  Altered sleeping 0 0 - 0 -  Tired, decreased energy 0 1 - 1 -  Change in appetite 0 0 - 0 -  Feeling bad or failure about yourself  0 0 - 0 -  Trouble concentrating 0 0 - 0 -  Moving slowly or fidgety/restless 0 0 - 0 -  Suicidal thoughts 0 0 - 0 -  PHQ-9 Score 0 1 - 1 -  Difficult doing work/chores - Not difficult at all - Not difficult at all -  Some recent data might be hidden    The patient does not have a history of falls. I did not complete a risk assessment for falls. A plan of care for falls was not documented.   Past Medical History:  Past Medical History:  Diagnosis Date  . Allergy   . Anxiety   . COPD (chronic obstructive pulmonary disease) ([place])   . Depression   . GERD (gastroesophageal reflux disease)   . Hypertension   . Internal hemorrhoid   . Lichen sclerosus   . Osteoporosis   . Spinal stenosis   . Tobacco use     Surgical History:  Past Surgical History:  Procedure Laterality Date  . BREAST EXCISIONAL BIOPSY Bilateral    1980's - benign  . BREAST SURGERY Bilateral    biopsy  . COLONOSCOPY WITH PROPOFOL N/A 10/03/2019   Procedure: COLONOSCOPY WITH PROPOFOL;  Surgeon: Jonathon Bellows, MD;  [facility] ENDOSCOPY;  Service: Gastroenterology;  Laterality: N/A;  . EYE SURGERY Bilateral Feb-Mar 2016  .  HERNIA REPAIR    . TUBAL LIGATION      Medications:  Current Outpatient Medications on File Prior to Visit  Medication Sig  . albuterol (VENTOLIN HFA) 108 (90 Base) MCG/ACT inhaler Inhale 1-2 puffs into the lungs every 6 (six) hours as needed for wheezing or shortness of breath.  Marland Kitchen alendronate (FOSAMAX) 70 MG tablet Take 1 tablet (70 mg total) by mouth once a week. Take with a full glass of water on an empty stomach.  Marland Kitchen aspirin 81 MG tablet Take 81 mg by mouth daily.  . fluticasone (FLONASE) 50 MCG/ACT nasal spray Place 2 sprays into both nostrils daily.  Marland Kitchen losartan (COZAAR) 100 MG tablet Take 1 tablet (100 mg total) by mouth daily.  Marland Kitchen omeprazole (PRILOSEC) 20 MG capsule Take 20 mg by mouth daily.  Marland Kitchen venlafaxine XR (EFFEXOR-XR) 75 MG 24 hr capsule Take 1 capsule (75 mg total) by mouth daily with breakfast.   No current facility-administered medications on file prior to visit.    Allergies:  Allergies  Allergen Reactions  . Midol [Acetaminophen] Hypertension  . Sulfur     Social History:  Social  History   Socioeconomic History  . Marital status: Married    Spouse name: Not on file  . Number of children: Not on file  . Years of education: Not on file  . Highest education level: 8th grade  Occupational History  . Occupation: retired  Tobacco Use  . Smoking status: Current Every Day Smoker    Packs/day: 0.25    Types: Cigarettes  . Smokeless tobacco: Never Used  . Tobacco comment: 1 pack every 3 days   Vaping Use  . Vaping Use: Never used  Substance and Sexual Activity  . Alcohol use: Yes    Alcohol/week: 3.0 standard drinks    Types: 3 Glasses of wine per week  . Drug use: No  . Sexual activity: Yes  Other Topics Concern  . Not on file  Social History Narrative   Retired, has daughter living at home with her and her husband    Social Determinants of Radio broadcast assistant Strain:   . Difficulty of Paying Living Expenses: Not on file  Food Insecurity:   .  Worried About Charity fundraiser in the Last Year: Not on file  . Ran Out of Food in the Last Year: Not on file  Transportation Needs:   . Lack of Transportation (Medical): Not on file  . Lack of Transportation (Non-Medical): Not on file  Physical Activity:   . Days of Exercise per Week: Not on file  . Minutes of Exercise per Session: Not on file  Stress:   . Feeling of Stress : Not on file  Social Connections:   . Frequency of Communication with Friends and Family: Not on file  . Frequency of Social Gatherings with Friends and Family: Not on file  . Attends Religious Services: Not on file  . Active Member of Clubs or Organizations: Not on file  . Attends Archivist Meetings: Not on file  . Marital Status: Not on file  Intimate Partner Violence:   . Fear of Current or Ex-Partner: Not on file  . Emotionally Abused: Not on file  . Physically Abused: Not on file  . Sexually Abused: Not on file   Social History   Tobacco Use  Smoking Status Current Every Day Smoker  . Packs/day: 0.25  . Types: Cigarettes  Smokeless Tobacco Never Used  Tobacco Comment   1 pack every 3 days    Social History   Substance and Sexual Activity  Alcohol Use Yes  . Alcohol/week: 3.0 standard drinks  . Types: 3 Glasses of wine per week    Family History:  Family History  Problem Relation Age of Onset  . Dementia Mother   . Anxiety disorder Mother   . Hypertension Mother   . Epilepsy Mother   . Cancer Father        lung  . Diabetes Brother   . Colon cancer Brother   . Hypertension Daughter   . Heart disease Maternal Grandfather        MI  . Hypertension Brother   . Diabetes Daughter   . Arthritis Daughter   . Breast cancer Neg Hx     Past medical history, surgical history, medications, allergies, family history and social history reviewed with patient today and changes made to appropriate areas of the chart.   Review of Systems - husband All other ROS negative except what  is listed above and in the HPI.      Objective:    BP 132/77  Pulse 98   Temp 98.2 F (36.8 C)   Ht 5' 0.75" (1.543 m)   Wt 101 lb 6.4 oz (46 kg)   SpO2 95%   BMI 19.32 kg/m   Wt Readings from Last 3 Encounters:  03/19/20 101 lb 6.4 oz (46 kg)  10/03/19 100 lb (45.4 kg)  09/16/19 103 lb (46.7 kg)    Physical Exam Vitals and nursing note reviewed.  Constitutional:      General: She is awake. She is not in acute distress.    Appearance: She is well-developed. She is not ill-appearing.  HENT:     Head: Normocephalic and atraumatic.     Right Ear: Hearing, tympanic membrane, ear canal and external ear normal. No drainage.     Left Ear: Hearing, tympanic membrane, ear canal and external ear normal. No drainage.     Nose: Nose normal.     Right Sinus: No maxillary sinus tenderness or frontal sinus tenderness.     Left Sinus: No maxillary sinus tenderness or frontal sinus tenderness.     Mouth/Throat:     Mouth: Mucous membranes are moist.     Pharynx: Oropharynx is clear. Uvula midline. No pharyngeal swelling, oropharyngeal exudate or posterior oropharyngeal erythema.  Eyes:     General: Lids are normal.        Right eye: No discharge.        Left eye: No discharge.     Extraocular Movements: Extraocular movements intact.     Conjunctiva/sclera: Conjunctivae normal.     Pupils: Pupils are equal, round, and reactive to light.     Visual Fields: Right eye visual fields normal and left eye visual fields normal.  Neck:     Thyroid: No thyromegaly.     Vascular: No carotid bruit.     Trachea: Trachea normal.  Cardiovascular:     Rate and Rhythm: Normal rate and regular rhythm.     Heart sounds: Normal heart sounds. No murmur heard.  No gallop.   Pulmonary:     Effort: Pulmonary effort is normal. No accessory muscle usage or respiratory distress.     Breath sounds: Normal breath sounds.  Abdominal:     General: Bowel sounds are normal.     Palpations: Abdomen is soft.  There is no hepatomegaly or splenomegaly.     Tenderness: There is no abdominal tenderness.  Musculoskeletal:        General: Normal range of motion.     Cervical back: Normal range of motion and neck supple.     Right lower leg: No edema.     Left lower leg: No edema.  Lymphadenopathy:     Head:     Right side of head: No submental, submandibular, tonsillar, preauricular or posterior auricular adenopathy.     Left side of head: No submental, submandibular, tonsillar, preauricular or posterior auricular adenopathy.     Cervical: No cervical adenopathy.  Skin:    General: Skin is warm and dry.     Capillary Refill: Capillary refill takes less than 2 seconds.     Findings: No rash.  Neurological:     Mental Status: She is alert and oriented to person, place, and time.     Cranial Nerves: Cranial nerves are intact.     Gait: Gait is intact.     Deep Tendon Reflexes: Reflexes are normal and symmetric.     Reflex Scores:      Brachioradialis reflexes are 2+ on the right side and  2+ on the left side.      Patellar reflexes are 2+ on the right side and 2+ on the left side. Psychiatric:        Attention and Perception: Attention normal.        Mood and Affect: Mood normal.        Speech: Speech normal.        Behavior: Behavior normal. Behavior is cooperative.        Thought Content: Thought content normal.        Judgment: Judgment normal.    Results for orders placed or performed during the hospital encounter of 10/03/19  Surgical pathology  Result Value Ref Range   SURGICAL PATHOLOGY      SURGICAL PATHOLOGY CASE: [id number] PATIENT: Skeet Simmer Surgical Pathology Report     Specimen Submitted: A. Colon polyp, cecum; cold snare B. Colon polyp, ascending; cold snare C. Colon polyp, transverse; cold snare  Clinical History: Screening colonoscopy.  Diverticulosis, colon polyps     DIAGNOSIS: A.  COLON POLYP, CECUM; COLD SNARE: - TUBULAR ADENOMA, MULTIPLE FRAGMENTS  - NEGATIVE FOR HIGH-GRADE DYSPLASIA AND MALIGNANCY.  B.  COLON POLYP, ASCENDING; COLD SNARE: - TUBULAR ADENOMA, MULTIPLE FRAGMENTS - NEGATIVE FOR HIGH-GRADE DYSPLASIA AND MALIGNANCY.  C.  COLON POLYP, TRANSVERSE; COLD SNARE: - TUBULAR ADENOMA, MULTIPLE FRAGMENTS - NEGATIVE FOR HIGH-GRADE DYSPLASIA AND MALIGNANCY.  GROSS DESCRIPTION: A. Labeled: Cold snare cecal polyp Received: Formalin Tissue fragment(s): Multiple Size: Aggregate, 1.1 x 0.9 x 0.2 cm Description: Tan soft tissue fragments Entirely submitted in 1 cassette.  B. Labeled: Cold snare ascending colon polyp Rece ived: Formalin Tissue fragment(s): Multiple Size: Aggregate, 1.2 x 1 x 0.1 cm Description: Tan soft tissue fragments Entirely submitted in 1 cassette.  C. Labeled: Cold snare transverse colon polyp Received: Formalin Tissue fragment(s): Multiple Size: Aggregate, 1.7 x 0.6 x 0.2 cm Description: Tan soft tissue fragments with admixed fecal matter Entirely submitted in 1 cassette.   Final Diagnosis performed by Bryan Lemma, MD.   Electronically signed 10/04/2019 7:07:22PM The electronic signature indicates that the named Attending Pathologist has evaluated the specimen Technical component performed at [facility], [address], [place], [place] [address] Lab: [phone] Dir: Rush Farmer, MD, MMM  Professional component performed at [facility], [facility], [place], [place], [place] [address] Lab: [phone] Dir: Dellia Nims. Reuel Derby, MD       Assessment & Plan:   Problem List Items Addressed This Visit      Musculoskeletal and Integument   Osteoporosis   Relevant Orders   DG Bone Density     Other   Nicotine dependence, cigarettes, uncomplicated   Relevant Orders   Spirometry with graph (Completed)    Other Visit Diagnoses    Encounter for annual physical exam    -  Primary   Annual labs today to include CBC, CMP, TSH, lipid + spirometry.   Relevant Orders   Spirometry  with graph (Completed)   CBC with Differential/Platelet   Comprehensive metabolic panel   Thyroid disorder screen       TSH today on labs   Relevant Orders   TSH   Screening cholesterol level       Lipid panel on exam today   Relevant Orders   Lipid Panel w/o Chol/HDL Ratio   Encounter for screening mammogram for malignant neoplasm of breast       Mammogram ordered   Relevant Orders   MM DIGITAL SCREENING BILATERAL       Follow  up plan: Return in about 6 months (around 09/17/2020) for HTN/HLD, COPD, MOOD, OSTEOPOROSIS.   LABORATORY TESTING:  - Pap smear: not applicable  IMMUNIZATIONS:   - Tdap: Tetanus vaccination status reviewed: last tetanus booster within 10 years. - Influenza: received at [place] recently - Pneumovax: Up to date - Prevnar: Up to date - HPV: Not applicable - Zostavax vaccine: Up to date  SCREENING: -Mammogram: Ordered today  - Colonoscopy: Up to date  - Bone Density: Ordered today  -Hearing Test: Not applicable  -Spirometry: Ordered today   PATIENT COUNSELING:   Advised to take 1 mg of folate supplement per day if capable of pregnancy.   Sexuality: Discussed sexually transmitted diseases, partner selection, use of condoms, avoidance of unintended pregnancy  and contraceptive alternatives.   Advised to avoid cigarette smoking.  I discussed with the patient that most people either abstain from alcohol or drink within safe limits (<=14/week and <=4 drinks/occasion for males, <=7/weeks and <= 3 drinks/occasion for females) and that the risk for alcohol disorders and other health effects rises proportionally with the number of drinks per week and how often a drinker exceeds daily limits.  Discussed cessation/primary prevention of drug use and availability of treatment for abuse.   Diet: Encouraged to adjust caloric intake to maintain  or achieve ideal body weight, to reduce intake of dietary saturated fat and total fat, to limit sodium intake by  avoiding high sodium foods and not adding table salt, and to maintain adequate dietary potassium and calcium preferably from fresh fruits, vegetables, and low-fat dairy products.    Stressed the importance of regular exercise  Injury prevention: Discussed safety belts, safety helmets, smoke detector, smoking near bedding or upholstery.   Dental health: Discussed importance of regular tooth brushing, flossing, and dental visits.    NEXT PREVENTATIVE PHYSICAL DUE IN 1 YEAR. Return in about 6 months (around 09/17/2020) for HTN/HLD, COPD, MOOD, OSTEOPOROSIS.

## 2020-03-19 NOTE — Patient Instructions (Addendum)
[facility] at [facility]  Address: [address] [place], [place], [place] [address]  Phone: [phone] --- schedule both mammogram and bone density   Bone Density Test The bone density test uses a special type of X-ray to measure the amount of calcium and other minerals in your bones. It can measure bone density in the hip and the spine. The test procedure is similar to having a regular X-ray. This test may also be called:  Bone densitometry.  Bone mineral density test.  Dual-energy X-ray absorptiometry (DEXA). You may have this test to:  Diagnose a condition that causes weak or thin bones (osteoporosis).  Screen you for osteoporosis.  Predict your risk for a broken bone (fracture).  Determine how well your osteoporosis treatment is working. Tell a health care provider about:  Any allergies you have.  All medicines you are taking, including vitamins, herbs, eye drops, creams, and over-the-counter medicines.  Any problems you or family members have had with anesthetic medicines.  Any blood disorders you have.  Any surgeries you have had.  Any medical conditions you have.  Whether you are pregnant or may be pregnant.  Any medical tests you have had within the past 14 days that used contrast material. What are the risks? Generally, this is a safe procedure. However, it does expose you to a small amount of radiation, which can slightly increase your cancer risk. What happens before the procedure?  Do not take any calcium supplements starting 24 hours before your test.  Remove all metal jewelry, eyeglasses, dental appliances, and any other metal objects. What happens during the procedure?   You will lie down on an exam table. There will be an X-ray generator below you and an imaging device above you.  Other devices, such as boxes or braces, may be used to position your body properly for the scan.  The machine will slowly scan your body. You will  need to keep still.  The images will show up on a screen in the room. Images will be examined by a specialist after your test is done. The procedure may vary among health care providers and hospitals. What happens after the procedure?  It is up to you to get your test results. Ask your health care provider, or the department that is doing the test, when your results will be ready. Summary  A bone density test is an imaging test that uses a type of X-ray to measure the amount of calcium and other minerals in your bones.  The test may be used to diagnose or screen you for a condition that causes weak or thin bones (osteoporosis), predict your risk for a broken bone (fracture), or determine how well your osteoporosis treatment is working.  Do not take any calcium supplements starting 24 hours before your test.  Ask your health care provider, or the department that is doing the test, when your results will be ready. This information is not intended to replace advice given to you by your health care provider. Make sure you discuss any questions you have with your health care provider. Document Revised: 04/16/2017 Document Reviewed: 02/02/2017 Elsevier Patient Education  [place].

## 2020-03-20 LAB — CBC WITH DIFFERENTIAL/PLATELET
Basophils Absolute: 0.2 10*3/uL (ref 0.0–0.2)
Basos: 2 %
EOS (ABSOLUTE): 0.2 10*3/uL (ref 0.0–0.4)
Eos: 2 %
Hematocrit: 43.2 % (ref 34.0–46.6)
Hemoglobin: 14.7 g/dL (ref 11.1–15.9)
Immature Grans (Abs): 0 10*3/uL (ref 0.0–0.1)
Immature Granulocytes: 0 %
Lymphocytes Absolute: 2.3 10*3/uL (ref 0.7–3.1)
Lymphs: 26 %
MCH: 32.6 pg (ref 26.6–33.0)
MCHC: 34 g/dL (ref 31.5–35.7)
MCV: 96 fL (ref 79–97)
Monocytes Absolute: 1 10*3/uL — ABNORMAL HIGH (ref 0.1–0.9)
Monocytes: 11 %
Neutrophils Absolute: 5.2 10*3/uL (ref 1.4–7.0)
Neutrophils: 59 %
Platelets: 285 10*3/uL (ref 150–450)
RBC: 4.51 x10E6/uL (ref 3.77–5.28)
RDW: 11.6 % — ABNORMAL LOW (ref 11.7–15.4)
WBC: 8.9 10*3/uL (ref 3.4–10.8)

## 2020-03-20 LAB — LIPID PANEL W/O CHOL/HDL RATIO
Cholesterol, Total: 165 mg/dL (ref 100–199)
HDL: 98 mg/dL
LDL Chol Calc (NIH): 53 mg/dL (ref 0–99)
Triglycerides: 72 mg/dL (ref 0–149)
VLDL Cholesterol Cal: 14 mg/dL (ref 5–40)

## 2020-03-20 LAB — COMPREHENSIVE METABOLIC PANEL WITH GFR
ALT: 15 [IU]/L (ref 0–32)
AST: 24 [IU]/L (ref 0–40)
Albumin/Globulin Ratio: 2 (ref 1.2–2.2)
Albumin: 4.7 g/dL (ref 3.7–4.7)
Alkaline Phosphatase: 61 [IU]/L (ref 44–121)
BUN/Creatinine Ratio: 10 — ABNORMAL LOW (ref 12–28)
BUN: 8 mg/dL (ref 8–27)
Bilirubin Total: 0.7 mg/dL (ref 0.0–1.2)
CO2: 24 mmol/L (ref 20–29)
Calcium: 9.7 mg/dL (ref 8.7–10.3)
Chloride: 98 mmol/L (ref 96–106)
Creatinine, Ser: 0.79 mg/dL (ref 0.57–1.00)
GFR calc Af Amer: 86 mL/min/{1.73_m2}
GFR calc non Af Amer: 74 mL/min/{1.73_m2}
Globulin, Total: 2.3 g/dL (ref 1.5–4.5)
Glucose: 88 mg/dL (ref 65–99)
Potassium: 3.9 mmol/L (ref 3.5–5.2)
Sodium: 137 mmol/L (ref 134–144)
Total Protein: 7 g/dL (ref 6.0–8.5)

## 2020-03-20 LAB — TSH: TSH: 2.67 u[IU]/mL (ref 0.450–4.500)

## 2020-03-20 NOTE — Progress Notes (Signed)
Good morning, please let Lauren Nolan know her labs have returned.  Overall they are within good ranges and no medication changes are needed.  She is doing fantastic.  Continue scheduled follow-up visits and have a Merry Christmas. Keep being awesome!!  Thank you for allowing me to participate in your care. Kindest regards, Kanon Novosel

## 2020-04-23 ENCOUNTER — Other Ambulatory Visit: Payer: Self-pay | Admitting: Nurse Practitioner

## 2020-04-23 NOTE — Telephone Encounter (Signed)
  Notes to clinic:  medication filled by historical provider Review for refill    Requested Prescriptions  Pending Prescriptions Disp Refills   omeprazole (PRILOSEC) 20 MG capsule [Pharmacy Med Name: OMEPRAZOLE DR 20 MG CAPSULE] 90 capsule 0    Sig: Take 1 capsule (20 mg total) by mouth daily.      Gastroenterology: Proton Pump Inhibitors Passed - 04/23/2020 10:32 AM      Passed - Valid encounter within last 12 months    Recent Outpatient Visits           1 month ago Encounter for annual physical exam   [facility] [place], Barbaraann Faster, NP   7 months ago Essential hypertension   [place], [place] T, NP   10 months ago Left foot pain   [facility] [place], [place], DO   11 months ago Neoplasm of uncertain behavior   [place], [place], [place]   11 months ago [place], Lilia Argue, [place]       Future Appointments             In 3 months  [place], [place]   In 4 months Cannady, Barbaraann Faster, NP [employer], [place]

## 2020-04-23 NOTE — Telephone Encounter (Signed)
Patient last seen 03/19/20 and has appointment 09/17/20

## 2020-05-02 ENCOUNTER — Other Ambulatory Visit: Payer: Self-pay | Admitting: Nurse Practitioner

## 2020-05-02 ENCOUNTER — Telehealth: Payer: Self-pay

## 2020-05-02 DIAGNOSIS — F329 Major depressive disorder, single episode, unspecified: Secondary | ICD-10-CM

## 2020-05-02 MED ORDER — OLMESARTAN MEDOXOMIL 40 MG PO TABS: 40.0000 mg | ORAL_TABLET | Freq: Every day | ORAL | 0 refills | Status: DC

## 2020-05-02 NOTE — Telephone Encounter (Signed)
Copied from [place] [phone]. Topic: General - Other >> May 02, 2020 10:00 AM Leward Quan A wrote: Reason for CRM: Patient called in to inform Jolene Cannady that her losartan (COZAAR) 100 MG tablet is on back order and the pharmacist suggest that Dr may want to prescribe something else. Please advise only have a week worth left Ph## [phone]

## 2020-05-02 NOTE — Telephone Encounter (Signed)
Copied from [place] [phone]. Topic: Quick Communication - Rx Refill/Question >> May 02, 2020 10:02 AM Leward Quan A wrote: Medication: venlafaxine XR (EFFEXOR-XR) 75 MG 24 hr capsule Due to expected bad weather need refill please   Has the patient contacted their pharmacy? Yes.   (Agent: If no, request that the patient contact the pharmacy for the refill.) (Agent: If yes, when and what did the pharmacy advise?)  Preferred Pharmacy (with phone number or street name): [place], [place]  Phone:  [phone] Fax:  [phone]     Agent: Please be advised that RX refills may take up to 3 business days. We ask that you follow-up with your pharmacy.

## 2020-05-02 NOTE — Telephone Encounter (Signed)
Patient notified

## 2020-05-02 NOTE — Telephone Encounter (Signed)
Will send in Olmesartan at this time since Losartan not available, when is available again will return to Losartan as is preferred.

## 2020-05-08 ENCOUNTER — Telehealth: Payer: Self-pay

## 2020-05-08 NOTE — Telephone Encounter (Signed)
Patient notified

## 2020-05-08 NOTE — Telephone Encounter (Signed)
Copied from [place] [phone]. Topic: General - Other >> May 08, 2020  9:38 AM Yvette Rack wrote: Reason for CRM: Pt stated she received a request for jury duty but she would like a medical excuse due to having anxiety and COPD. Pt stated she has the form that the provider is to complete. Jury duty scheduled for 05/31/20. Pt requests call back  Would pt need apt or can this be done if Pt bring in form?

## 2020-05-08 NOTE — Telephone Encounter (Signed)
Routing to provider to advise.  

## 2020-05-08 NOTE — Telephone Encounter (Signed)
She can bring in form and I will complete.  During Covid I would prefer she not serve on jury due to her higher risk factors.

## 2020-05-09 NOTE — Telephone Encounter (Signed)
Pt brought form in left in bin to be reviewed.

## 2020-05-10 ENCOUNTER — Encounter: Payer: Self-pay | Admitting: Nurse Practitioner

## 2020-05-10 NOTE — Telephone Encounter (Signed)
FYI

## 2020-05-10 NOTE — Telephone Encounter (Signed)
Form placed in folder for provider review.

## 2020-05-10 NOTE — Telephone Encounter (Signed)
I have printed letter to back printer and she can pick up forms and letter tomorrow to complete and send.  Thank you.

## 2020-05-14 NOTE — Telephone Encounter (Signed)
Letter placed with paperwork.

## 2020-05-25 ENCOUNTER — Other Ambulatory Visit: Payer: Self-pay

## 2020-05-25 ENCOUNTER — Ambulatory Visit (INDEPENDENT_AMBULATORY_CARE_PROVIDER_SITE_OTHER): Payer: Medicare Other | Admitting: Nurse Practitioner

## 2020-05-25 ENCOUNTER — Encounter: Payer: Self-pay | Admitting: Nurse Practitioner

## 2020-05-25 VITALS — BP 117/76 | HR 78 | Temp 98.1°F | Ht 60.55 in | Wt 101.8 lb

## 2020-05-25 DIAGNOSIS — N8184 Pelvic muscle wasting: Secondary | ICD-10-CM

## 2020-05-25 DIAGNOSIS — N760 Acute vaginitis: Secondary | ICD-10-CM | POA: Diagnosis not present

## 2020-05-25 DIAGNOSIS — N898 Other specified noninflammatory disorders of vagina: Secondary | ICD-10-CM | POA: Diagnosis not present

## 2020-05-25 DIAGNOSIS — B9689 Other specified bacterial agents as the cause of diseases classified elsewhere: Secondary | ICD-10-CM | POA: Diagnosis not present

## 2020-05-25 DIAGNOSIS — N819 Female genital prolapse, unspecified: Secondary | ICD-10-CM | POA: Insufficient documentation

## 2020-05-25 LAB — URINALYSIS, ROUTINE W REFLEX MICROSCOPIC
Bilirubin, UA: NEGATIVE
Glucose, UA: NEGATIVE
Ketones, UA: NEGATIVE
Nitrite, UA: NEGATIVE
Protein,UA: NEGATIVE
RBC, UA: NEGATIVE
Specific Gravity, UA: 1.015 (ref 1.005–1.030)
Urobilinogen, Ur: 0.2 mg/dL (ref 0.2–1.0)
pH, UA: 6 (ref 5.0–7.5)

## 2020-05-25 LAB — WET PREP FOR TRICH, YEAST, CLUE
Clue Cell Exam: POSITIVE — AB
Trichomonas Exam: NEGATIVE
Yeast Exam: NEGATIVE

## 2020-05-25 LAB — MICROSCOPIC EXAMINATION
Bacteria, UA: NONE SEEN
RBC, Urine: NONE SEEN /hpf (ref 0–2)

## 2020-05-25 MED ORDER — METRONIDAZOLE 500 MG PO TABS
500.0000 mg | ORAL_TABLET | Freq: Two times a day (BID) | ORAL | 0 refills | Status: AC
Start: 2020-05-25 — End: 2020-06-01

## 2020-05-25 NOTE — Assessment & Plan Note (Signed)
Ongoing issue in past, used pessary at one time but not in years.  Suspect uterine rolapse again and concern for infection or bleeding with this, educated patient at length on diagnosis and risks with prolapse.  Will get her in with GYN to discuss treatment options. No prolapse noted today on speculum exam.  Moderate vaginal atrophy noted on exam.  Recommend she not do any heavy lifting or straining until further evaluation by GYN.  If any prolapse with bleeding or a fever presents immediately be seen in ER.  Return in 4 weeks for follow-up.

## 2020-05-25 NOTE — Progress Notes (Signed)
BP 117/76   Pulse 78   Temp 98.1 F (36.7 C) (Oral)   Ht 5' 0.55" (1.538 m)   Wt 101 lb 12.8 oz (46.2 kg)   SpO2 95%   BMI 19.52 kg/m    Subjective:    Patient ID: Lauren Nolan, female    DOB: Apr 04, 1946, 75 y.o.   MRN: [id number]  HPI: Lauren Nolan is a 74 y.o. female  Chief Complaint  Patient presents with  . something in vagina    Patient can feel and see something coming out of her vagina for the past month.   VAGINAL DISCHARGE Reports some discharge coming out of vagina with odor.  Has been washing and using baby wipes, but odor remains present.  She reports that she has also seen something shiny and pink protruding out of vagina on occasion.  Does a lot of heavy lifting and work around home, feels something in vagina at times with this.    Many years ago she had similar, when something would come out in tub, would push back in.  She went to provider and they sent her to [facility].  They gave her pessary to place inside -- she never went back for follow-up.  This issue improved -- has not used pessary in years.  She still has uterus -- has only had tubal ligation. History of 4 vaginal births.   Duration: weeks Discharge description: white  Pruritus: no Dysuria: no Malodorous: yes Urinary frequency: no Fevers: no Abdominal pain: no  Sexual activity: monogamous History of sexually transmitted diseases: no Recent antibiotic use: no Context: better  Treatments attempted: none  Relevant past medical, surgical, family and social history reviewed and updated as indicated. Interim medical history since our last visit reviewed. Allergies and medications reviewed and updated.  Review of Systems  Constitutional: Negative for activity change, appetite change, diaphoresis, fatigue and fever.  Respiratory: Negative for cough, chest tightness and shortness of breath.   Cardiovascular: Negative for chest pain, palpitations and leg swelling.  Gastrointestinal: Negative.    Genitourinary: Positive for vaginal discharge. Negative for dysuria, flank pain, frequency, pelvic pain, urgency, vaginal bleeding and vaginal pain.  Neurological: Negative.   Psychiatric/Behavioral: Negative.     Per HPI unless specifically indicated above     Objective:    BP 117/76   Pulse 78   Temp 98.1 F (36.7 C) (Oral)   Ht 5' 0.55" (1.538 m)   Wt 101 lb 12.8 oz (46.2 kg)   SpO2 95%   BMI 19.52 kg/m   Wt Readings from Last 3 Encounters:  05/25/20 101 lb 12.8 oz (46.2 kg)  03/19/20 101 lb 6.4 oz (46 kg)  10/03/19 100 lb (45.4 kg)    Physical Exam Vitals and nursing note reviewed. Exam conducted with a chaperone present.  Constitutional:      General: She is awake. She is not in acute distress.    Appearance: She is well-developed and well-groomed. She is not ill-appearing or toxic-appearing.  HENT:     Head: Normocephalic.     Right Ear: Hearing normal.     Left Ear: Hearing normal.  Eyes:     General: Lids are normal.        Right eye: No discharge.        Left eye: No discharge.     Conjunctiva/sclera: Conjunctivae normal.     Pupils: Pupils are equal, round, and reactive to light.  Neck:     Thyroid: No thyromegaly.  Vascular: No carotid bruit.  Cardiovascular:     Rate and Rhythm: Normal rate and regular rhythm.     Heart sounds: Normal heart sounds. No murmur heard. No gallop.   Pulmonary:     Effort: Pulmonary effort is normal. No accessory muscle usage or respiratory distress.     Breath sounds: Normal breath sounds.  Abdominal:     General: Bowel sounds are normal. There is no distension.     Palpations: Abdomen is soft.     Tenderness: There is no abdominal tenderness.     Hernia: There is no hernia in the left inguinal area or right inguinal area.  Genitourinary:    Exam position: Lithotomy position.     Labia:        Right: No rash.        Left: No rash.      Urethra: No prolapse.     Cervix: Normal.     Uterus: Normal.      Adnexa:  Right adnexa normal and left adnexa normal.     Comments: No prolapse noted at this time, cervix slightly tilted to right on exam.  Vaginal walls with mild erythema, atrophic in appearance.  No bleeding.   Musculoskeletal:     Cervical back: Normal range of motion and neck supple.     Right lower leg: No edema.     Left lower leg: No edema.  Skin:    General: Skin is warm and dry.  Neurological:     Mental Status: She is alert and oriented to person, place, and time.     Deep Tendon Reflexes: Reflexes are normal and symmetric.     Reflex Scores:      Brachioradialis reflexes are 2+ on the right side and 2+ on the left side.      Patellar reflexes are 2+ on the right side and 2+ on the left side. Psychiatric:        Attention and Perception: Attention normal.        Mood and Affect: Mood normal.        Speech: Speech normal.        Behavior: Behavior normal. Behavior is cooperative.        Thought Content: Thought content normal.     Results for orders placed or performed in visit on 03/19/20  CBC with Differential/Platelet  Result Value Ref Range   WBC 8.9 3.4 - 10.8 x10E3/uL   RBC 4.51 3.77 - 5.28 x10E6/uL   Hemoglobin 14.7 11.1 - 15.9 g/dL   Hematocrit 43.2 34.0 - 46.6 %   MCV 96 79 - 97 fL   MCH 32.6 26.6 - 33.0 pg   MCHC 34.0 31.5 - 35.7 g/dL   RDW 11.6 (L) 11.7 - 15.4 %   Platelets 285 150 - 450 x10E3/uL   Neutrophils 59 Not Estab. %   Lymphs 26 Not Estab. %   Monocytes 11 Not Estab. %   Eos 2 Not Estab. %   Basos 2 Not Estab. %   Neutrophils Absolute 5.2 1.4 - 7.0 x10E3/uL   Lymphocytes Absolute 2.3 0.7 - 3.1 x10E3/uL   Monocytes Absolute 1.0 (H) 0.1 - 0.9 x10E3/uL   EOS (ABSOLUTE) 0.2 0.0 - 0.4 x10E3/uL   Basophils Absolute 0.2 0.0 - 0.2 x10E3/uL   Immature Granulocytes 0 Not Estab. %   Immature Grans (Abs) 0.0 0.0 - 0.1 x10E3/uL  Comprehensive metabolic panel  Result Value Ref Range   Glucose 88 65 - 99 mg/dL  BUN 8 8 - 27 mg/dL   Creatinine, Ser 0.79 0.57  - 1.00 mg/dL   GFR calc non Af Amer 74 >59 mL/min/1.73   GFR calc Af Amer 86 >59 mL/min/1.73   BUN/Creatinine Ratio 10 (L) 12 - 28   Sodium 137 134 - 144 mmol/L   Potassium 3.9 3.5 - 5.2 mmol/L   Chloride 98 96 - 106 mmol/L   CO2 24 20 - 29 mmol/L   Calcium 9.7 8.7 - 10.3 mg/dL   Total Protein 7.0 6.0 - 8.5 g/dL   Albumin 4.7 3.7 - 4.7 g/dL   Globulin, Total 2.3 1.5 - 4.5 g/dL   Albumin/Globulin Ratio 2.0 1.2 - 2.2   Bilirubin Total 0.7 0.0 - 1.2 mg/dL   Alkaline Phosphatase 61 44 - 121 IU/L   AST 24 0 - 40 IU/L   ALT 15 0 - 32 IU/L  Lipid Panel w/o Chol/HDL Ratio  Result Value Ref Range   Cholesterol, Total 165 100 - 199 mg/dL   Triglycerides 72 0 - 149 mg/dL   HDL 98 >39 mg/dL   VLDL Cholesterol Cal 14 5 - 40 mg/dL   LDL Chol Calc (NIH) 53 0 - 99 mg/dL  TSH  Result Value Ref Range   TSH 2.670 0.450 - 4.500 uIU/mL      Assessment & Plan:   Problem List Items Addressed This Visit      Genitourinary   Bacterial vaginosis    Acute with report of increased discharge with odor.  Some atrophic vaginitis noted on exam.  UA trace LEUK and no bacteria.  Wet prep + clue cells and negative yeast and trich.  At this time treat with Flagyl 500 MG BID x 7 days, educated her on this medication and diagnosis.  Recommend to avoid douching area.  Return to office for worsening or ongoing.      Relevant Medications   metroNIDAZOLE (FLAGYL) 500 MG tablet   Other Relevant Orders   WET PREP FOR TRICH, YEAST, CLUE   Urinalysis, Routine w reflex microscopic   Prolapse of female pelvic organs - Primary    Ongoing issue in past, used pessary at one time but not in years.  Suspect uterine rolapse again and concern for infection or bleeding with this, educated patient at length on diagnosis and risks with prolapse.  Will get her in with GYN to discuss treatment options. No prolapse noted today on speculum exam.  Moderate vaginal atrophy noted on exam.  Recommend she not do any heavy lifting or  straining until further evaluation by GYN.  If any prolapse with bleeding or a fever presents immediately be seen in ER.  Return in 4 weeks for follow-up.      Relevant Orders   Ambulatory referral to Gynecology       Follow up plan: Return in about 4 weeks (around 06/22/2020) for Pelvic Organ Prolapse.

## 2020-05-25 NOTE — Patient Instructions (Signed)
Pelvic Organ Prolapse Pelvic organ prolapse is a condition in women that involves the stretching, bulging, or dropping of pelvic organs into an abnormal position, past the opening of the vagina. It happens when the muscles and tissues that surround and support pelvic structures become weak or stretched. Pelvic organ prolapse can involve the:  Vagina (vaginal prolapse).  Uterus (uterine prolapse).  Bladder (cystocele).  Rectum (rectocele).  Intestines (enterocele). When organs other than the vagina are involved, they often bulge into the vagina or protrude from the vagina, depending on how severe the prolapse is. What are the causes? This condition may be caused by:  Pregnancy, labor, and childbirth.  Past pelvic surgery.  Lower levels of the hormone estrogen due to menopause.  Consistently lifting more than 50 lb (23 kg).  Obesity.  Long-term difficulty passing stool (chronic constipation).  Long-term, or chronic, cough.  Fluid buildup in the abdomen due to certain conditions. What are the signs or symptoms? Symptoms of this condition include:  Leaking a little urine (loss of bladder control) when you cough, sneeze, strain, and exercise (stress incontinence). This may be worse immediately after childbirth. It may gradually improve over time.  Feeling pressure in your pelvis or vagina. This pressure may increase when you cough or when you are passing stool.  A bulge that protrudes from the opening of your vagina.  Difficulty passing urine or stool.  Pain in your lower back.  Pain or discomfort during sex, or decreased interest in sex.  Repeated bladder infections (urinary tract infections).  Difficulty inserting a tampon. In some people, this condition causes no symptoms. How is this diagnosed? This condition may be diagnosed based on a vaginal and rectal exam. During the exam, you may be asked to cough and strain while you are lying down, sitting, and standing up.  Your health care provider will determine if other tests are required, such as bladder function tests. How is this treated? Treatment for this condition may depend on your symptoms. Treatment may include:  Lifestyle changes, such as drinking plenty of fluids and eating foods that are high in fiber.  Emptying your bladder at scheduled times (bladder training therapy). This can help reduce or avoid urinary incontinence.  Estrogen. This may help mild prolapse by increasing the strength and tone of pelvic floor muscles.  Kegel exercises. These may help mild cases of prolapse by strengthening and tightening the muscles of the pelvic floor.  A soft, flexible device that helps support the vaginal walls and keep pelvic organs in place (pessary). This is inserted into your vagina by your health care provider.  Surgery. This is often the only form of treatment for severe prolapse. Follow these instructions at home: Eating and drinking  Avoid drinking beverages that contain caffeine or alcohol.  Increase your intake of high-fiber foods to decrease constipation and straining during bowel movements. Activity  Lose weight if recommended by your health care provider.  Avoid heavy lifting and straining with exercise and work. Do not hold your breath when you perform mild to moderate lifting and exercise activities. Limit your activities as directed by your health care provider.  Do Kegel exercises as directed by your health care provider. To do this: ? Squeeze your pelvic floor muscles tight. You should feel a tight lift in your rectal area and a tightness in your vaginal area. Keep your stomach, buttocks, and legs relaxed. ? Hold the muscles tight for up to 10 seconds. Then relax your muscles. ? Repeat this exercise  50 times a day, or as much as told by your health care provider. Continue to do this exercise for at least 4-6 weeks, or for as long as told by your health care provider. General  instructions  Take over-the-counter and prescription medicines only as told by your health care provider.  Wear a sanitary pad or adult diapers if you have urinary incontinence.  If you have a pessary, take care of it as told by your health care provider.  Keep all follow-up visits. This is important. Contact a health care provider if you:  Have symptoms that interfere with your daily activities or sex life.  Need medicine to help with the discomfort.  Notice bleeding from your vagina that is not related to your menstrual period.  Have a fever.  Have pain or bleeding when you urinate.  Have bleeding when you pass stool.  Pass urine when you have sex.  Have chronic constipation.  Have a pessary that falls out.  Have a foul-smelling vaginal discharge.  Have an unusual, low pain in your abdomen. Get help right away if you:  Cannot pass urine. Summary  Pelvic organ prolapse is the stretching, bulging, or dropping of pelvic organs into an abnormal position. It happens when the muscles and tissues that surround and support pelvic structures become weak or stretched.  When organs other than the vagina are involved, they often bulge into the vagina or protrude from it, depending on how severe the prolapse is.  In most cases, this condition needs to be treated only if it produces symptoms. Treatment may include lifestyle changes, estrogen, Kegel exercises, pessary insertion, or surgery.  Avoid heavy lifting and straining with exercise and work. Do not hold your breath when you perform mild to moderate lifting and exercise activities. Limit your activities as directed by your health care provider. This information is not intended to replace advice given to you by your health care provider. Make sure you discuss any questions you have with your health care provider. Document Revised: 09/26/2019 Document Reviewed: 09/26/2019 Elsevier Patient Education  [place].

## 2020-05-25 NOTE — Assessment & Plan Note (Signed)
Acute with report of increased discharge with odor.  Some atrophic vaginitis noted on exam.  UA trace LEUK and no bacteria.  Wet prep + clue cells and negative yeast and trich.  At this time treat with Flagyl 500 MG BID x 7 days, educated her on this medication and diagnosis.  Recommend to avoid douching area.  Return to office for worsening or ongoing.

## 2020-06-04 ENCOUNTER — Other Ambulatory Visit: Payer: Self-pay | Admitting: Nurse Practitioner

## 2020-06-06 ENCOUNTER — Encounter: Payer: Self-pay | Admitting: Obstetrics & Gynecology

## 2020-06-06 ENCOUNTER — Other Ambulatory Visit (HOSPITAL_COMMUNITY)
Admission: RE | Admit: 2020-06-06 | Discharge: 2020-06-06 | Disposition: A | Discharge status: Home / Self Care | Payer: Medicare Other | Source: Ambulatory Visit | Attending: Obstetrics & Gynecology | Admitting: Obstetrics & Gynecology

## 2020-06-06 ENCOUNTER — Ambulatory Visit (INDEPENDENT_AMBULATORY_CARE_PROVIDER_SITE_OTHER): Payer: Medicare Other | Admitting: Obstetrics & Gynecology

## 2020-06-06 ENCOUNTER — Other Ambulatory Visit: Payer: Self-pay

## 2020-06-06 VITALS — BP 120/80 | Ht 60.5 in | Wt 104.0 lb

## 2020-06-06 DIAGNOSIS — N8184 Pelvic muscle wasting: Secondary | ICD-10-CM | POA: Diagnosis not present

## 2020-06-06 DIAGNOSIS — L900 Lichen sclerosus et atrophicus: Secondary | ICD-10-CM

## 2020-06-06 DIAGNOSIS — Z124 Encounter for screening for malignant neoplasm of cervix: Secondary | ICD-10-CM

## 2020-06-06 NOTE — Progress Notes (Signed)
Cystocele/Rectocele Patient is a 75 yo WF who complains of a cystocele, rectocele and vaginal bulge like sx's of recent note and of past experience as well. Problem started a few months ago. Symptoms include: prolapse of tissue with straining. Symptoms have waxed and waned but are worse overall.  She has tried pessary in past, felt she got better, and so discontinued its use.  Sx's are not daily, but some days especially w activity she can feel pressure or bulge.  No pain.  No bleeding.  Urinary freq but no other urinary disturbances.  No incontinence.  Norm,al BMs  Constipation at times.  Occas sexual activity.  PMHx: She  has a past medical history of Allergy, Anxiety, COPD (chronic obstructive pulmonary disease) ([place]), Depression, GERD (gastroesophageal reflux disease), Hypertension, Internal hemorrhoid, Lichen sclerosus, Osteoporosis, Spinal stenosis, and Tobacco use. Also,  has a past surgical history that includes Tubal ligation; Hernia repair; Breast surgery (Bilateral); Eye surgery (Bilateral, Feb-Mar 2016); Breast excisional biopsy (Bilateral); and Colonoscopy with propofol (N/A, 10/03/2019)., family history includes Anxiety disorder in her mother; Arthritis in her daughter; Cancer in her daughter and father; Colon cancer in her brother; Dementia in her mother; Diabetes in her brother and daughter; Epilepsy in her mother; Heart disease in her maternal grandfather; Hypertension in her brother, daughter, and mother.,  reports that she has been smoking cigarettes. She has been smoking about 0.25 packs per day. She has never used smokeless tobacco. She reports current alcohol use of about 3.0 standard drinks of alcohol per week. She reports that she does not use drugs.  She has a current medication list which includes the following prescription(s): albuterol, alendronate, amlodipine, aspirin, fluticasone, olmesartan, omeprazole, and venlafaxine xr. Also, is allergic to elemental sulfur and midol  [acetaminophen].  Review of Systems  Constitutional: Negative for chills, fever and malaise/fatigue.  HENT: Negative for congestion, sinus pain and sore throat.   Eyes: Negative for blurred vision and pain.  Respiratory: Negative for cough and wheezing.   Cardiovascular: Negative for chest pain and leg swelling.  Gastrointestinal: Negative for abdominal pain, constipation, diarrhea, heartburn, nausea and vomiting.  Genitourinary: Positive for frequency. Negative for dysuria, hematuria and urgency.  Musculoskeletal: Negative for back pain, joint pain, myalgias and neck pain.  Skin: Negative for itching and rash.  Neurological: Negative for dizziness, tremors and weakness.  Endo/Heme/Allergies: Does not bruise/bleed easily.  Psychiatric/Behavioral: Negative for depression. The patient is not nervous/anxious and does not have insomnia.     Objective: BP 120/80   Ht 5' 0.5" (1.537 m)   Wt 104 lb (47.2 kg)   BMI 19.98 kg/m  Physical Exam Constitutional:      General: She is not in acute distress.    Appearance: She is well-developed.  Genitourinary:     Bladder, vagina, uterus and urethral meatus normal.     Right Labia: skin changes.     Left Labia: skin changes.      Vulva exam comments: C/w Lichen.     No vaginal erythema or bleeding.     Posterior vaginal prolapse present.    Moderate vaginal atrophy present.     Right Adnexa: not tender and no mass present.    Left Adnexa: not tender and no mass present.    No cervical motion tenderness, discharge, polyp or nabothian cyst.     Uterus is mobile.     Uterus is not enlarged.     No uterine mass detected.    Uterus exam comments: Small uterus, mild  prolapse.     Uterus is midaxial and midaxial.     Bladder exam comments: Min cystocele.     Pelvic exam was performed with patient in the lithotomy position.  HENT:     Head: Normocephalic and atraumatic.     Nose: Nose normal.  Abdominal:     General: There is no  distension.     Palpations: Abdomen is soft.     Tenderness: There is no abdominal tenderness.  Musculoskeletal:        General: Normal range of motion.  Neurological:     Mental Status: She is alert and oriented to person, place, and time.     Cranial Nerves: No cranial nerve deficit.  Skin:    General: Skin is warm and dry.  Psychiatric:        Attention and Perception: Attention normal.        Mood and Affect: Mood and affect normal.        Speech: Speech normal.        Behavior: Behavior normal.        Thought Content: Thought content normal.        Judgment: Judgment normal.     ASSESSMENT/PLAN:    Problem List Items Addressed This Visit      Genitourinary   Prolapse of female pelvic organs - Primary     Other   Lichen sclerosus    Pessary vs surgery vs exp mgt options d/w pt.  No therapy decided upon today as sx's are mild and not daily.  Lichen sclerosus discussed as well.  Prior use of Clobetasol but has rare flare ups.  No sx's currently.  Cont to monitor.  A total of 30 minutes were spent face-to-face with the patient as well as preparation, review, communication, and documentation during this encounter.   Barnett Applebaum, MD, [facility], [facility] 06/06/2020  11:03 AM

## 2020-06-06 NOTE — Addendum Note (Signed)
Addended by: Gae Dry on: 06/06/2020 11:31 AM   Modules accepted: Orders

## 2020-06-06 NOTE — Patient Instructions (Signed)
Pelvic Organ Prolapse Pelvic organ prolapse is a condition in women that involves the stretching, bulging, or dropping of pelvic organs into an abnormal position, past the opening of the vagina. It happens when the muscles and tissues that surround and support pelvic structures become weak or stretched. Pelvic organ prolapse can involve the:  Vagina (vaginal prolapse).  Uterus (uterine prolapse).  Bladder (cystocele).  Rectum (rectocele).  Intestines (enterocele). When organs other than the vagina are involved, they often bulge into the vagina or protrude from the vagina, depending on how severe the prolapse is. What are the causes? This condition may be caused by:  Pregnancy, labor, and childbirth.  Past pelvic surgery.  Lower levels of the hormone estrogen due to menopause.  Consistently lifting more than 50 lb (23 kg).  Obesity.  Long-term difficulty passing stool (chronic constipation).  Long-term, or chronic, cough.  Fluid buildup in the abdomen due to certain conditions. What are the signs or symptoms? Symptoms of this condition include:  Leaking a little urine (loss of bladder control) when you cough, sneeze, strain, and exercise (stress incontinence). This may be worse immediately after childbirth. It may gradually improve over time.  Feeling pressure in your pelvis or vagina. This pressure may increase when you cough or when you are passing stool.  A bulge that protrudes from the opening of your vagina.  Difficulty passing urine or stool.  Pain in your lower back.  Pain or discomfort during sex, or decreased interest in sex.  Repeated bladder infections (urinary tract infections).  Difficulty inserting a tampon. In some people, this condition causes no symptoms. How is this diagnosed? This condition may be diagnosed based on a vaginal and rectal exam. During the exam, you may be asked to cough and strain while you are lying down, sitting, and standing up.  Your health care provider will determine if other tests are required, such as bladder function tests. How is this treated? Treatment for this condition may depend on your symptoms. Treatment may include:  Lifestyle changes, such as drinking plenty of fluids and eating foods that are high in fiber.  Emptying your bladder at scheduled times (bladder training therapy). This can help reduce or avoid urinary incontinence.  Estrogen. This may help mild prolapse by increasing the strength and tone of pelvic floor muscles.  Kegel exercises. These may help mild cases of prolapse by strengthening and tightening the muscles of the pelvic floor.  A soft, flexible device that helps support the vaginal walls and keep pelvic organs in place (pessary). This is inserted into your vagina by your health care provider.  Surgery. This is often the only form of treatment for severe prolapse. Follow these instructions at home: Eating and drinking  Avoid drinking beverages that contain caffeine or alcohol.  Increase your intake of high-fiber foods to decrease constipation and straining during bowel movements. Activity  Lose weight if recommended by your health care provider.  Avoid heavy lifting and straining with exercise and work. Do not hold your breath when you perform mild to moderate lifting and exercise activities. Limit your activities as directed by your health care provider.  Do Kegel exercises as directed by your health care provider. To do this: ? Squeeze your pelvic floor muscles tight. You should feel a tight lift in your rectal area and a tightness in your vaginal area. Keep your stomach, buttocks, and legs relaxed. ? Hold the muscles tight for up to 10 seconds. Then relax your muscles. ? Repeat this exercise  50 times a day, or as much as told by your health care provider. Continue to do this exercise for at least 4-6 weeks, or for as long as told by your health care provider. General  instructions  Take over-the-counter and prescription medicines only as told by your health care provider.  Wear a sanitary pad or adult diapers if you have urinary incontinence.  If you have a pessary, take care of it as told by your health care provider.  Keep all follow-up visits. This is important. Contact a health care provider if you:  Have symptoms that interfere with your daily activities or sex life.  Need medicine to help with the discomfort.  Notice bleeding from your vagina that is not related to your menstrual period.  Have a fever.  Have pain or bleeding when you urinate.  Have bleeding when you pass stool.  Pass urine when you have sex.  Have chronic constipation.  Have a pessary that falls out.  Have a foul-smelling vaginal discharge.  Have an unusual, low pain in your abdomen. Get help right away if you:  Cannot pass urine. Summary  Pelvic organ prolapse is the stretching, bulging, or dropping of pelvic organs into an abnormal position. It happens when the muscles and tissues that surround and support pelvic structures become weak or stretched.  When organs other than the vagina are involved, they often bulge into the vagina or protrude from it, depending on how severe the prolapse is.  In most cases, this condition needs to be treated only if it produces symptoms. Treatment may include lifestyle changes, estrogen, Kegel exercises, pessary insertion, or surgery.  Avoid heavy lifting and straining with exercise and work. Do not hold your breath when you perform mild to moderate lifting and exercise activities. Limit your activities as directed by your health care provider. This information is not intended to replace advice given to you by your health care provider. Make sure you discuss any questions you have with your health care provider. Document Revised: 09/26/2019 Document Reviewed: 09/26/2019 Elsevier Patient Education  [place].

## 2020-06-08 LAB — CYTOLOGY - PAP: Diagnosis: NEGATIVE

## 2020-06-20 ENCOUNTER — Other Ambulatory Visit: Payer: Self-pay | Admitting: Obstetrics & Gynecology

## 2020-06-21 ENCOUNTER — Telehealth: Payer: Self-pay

## 2020-06-21 NOTE — Telephone Encounter (Signed)
Called pt to remind her to schedule her mammogram. Pt aware and states she will call and schedule.

## 2020-06-21 NOTE — Telephone Encounter (Signed)
-----   Message from Robert P Harris, MD sent at 06/20/2020  2:07 PM EST ----- Regarding: MMG Received notice she has not received MMG yet as ordered and needed Please check and encourage her to do this, and document conversation.   

## 2020-06-22 ENCOUNTER — Other Ambulatory Visit: Payer: Self-pay

## 2020-06-22 ENCOUNTER — Encounter: Payer: Self-pay | Admitting: Nurse Practitioner

## 2020-06-22 ENCOUNTER — Ambulatory Visit (INDEPENDENT_AMBULATORY_CARE_PROVIDER_SITE_OTHER): Payer: Medicare Other | Admitting: Nurse Practitioner

## 2020-06-22 DIAGNOSIS — N8184 Pelvic muscle wasting: Secondary | ICD-10-CM | POA: Diagnosis not present

## 2020-06-22 DIAGNOSIS — L900 Lichen sclerosus et atrophicus: Secondary | ICD-10-CM | POA: Diagnosis not present

## 2020-06-22 NOTE — Assessment & Plan Note (Signed)
At this time continue to monitor, patient does not want any intervention.  Return to GYN as needed. Avoid heavy lifting at home.

## 2020-06-22 NOTE — Progress Notes (Signed)
BP 126/79   Pulse 78   Temp 97.9 F (36.6 C) (Oral)   Wt 105 lb 6.4 oz (47.8 kg)   SpO2 98%   BMI 20.25 kg/m    Subjective:    Patient ID: Lauren Nolan, female    DOB: 1945/09/03, 74 y.o.   MRN: [id number]  HPI: Lauren Nolan is a 75 y.o. female  Chief Complaint  Patient presents with  . pelvic floor prolapse  . Follow-up    Patient states that Dr.Harris informed her there was no need to do anything at this time. Patient denies having any problem or concerns at today's visit.    PELVIC ORGAN PROLAPSE Follow-up from recent visit when she reported that she had seen something shiny and pink protruding out of vagina on occasion.  Does a lot of heavy lifting and work around home, feels something in vagina at times with this.  Seen by Dr. Kenton Kingfisher on 06/06/20 and patient decided on no therapy at this time, Dr. Kenton Kingfisher discussed various options with her.  She was also noted to have lichen sclerosus and no treatment provided as rare flare ups in past where she used Clobetasol.  She reports has been trying not to lift as heavy.    Many years ago she had similar, when something would come out in tub, would push back in.  She went to provider and they sent her to [facility].  They gave her pessary to place inside -- she never went back for follow-up.  This issue improved -- has not used pessary in years.  She still has uterus -- has only had tubal ligation. History of 4 vaginal births.   Duration: weeks Discharge description: white  Pruritus: no Dysuria: no Malodorous: yes Urinary frequency: no Fevers: no Abdominal pain: no  Sexual activity: monogamous History of sexually transmitted diseases: no Recent antibiotic use: no Context: better Treatments attempted: none  Relevant past medical, surgical, family and social history reviewed and updated as indicated. Interim medical history since our last visit reviewed. Allergies and medications reviewed and updated.  Review of Systems   Constitutional: Negative for activity change, appetite change, diaphoresis, fatigue and fever.  Respiratory: Negative for cough, chest tightness and shortness of breath.   Cardiovascular: Negative for chest pain, palpitations and leg swelling.  Gastrointestinal: Negative.   Genitourinary: Negative for dysuria, flank pain, frequency, pelvic pain, urgency, vaginal bleeding, vaginal discharge and vaginal pain.  Neurological: Negative.   Psychiatric/Behavioral: Negative.     Per HPI unless specifically indicated above     Objective:    BP 126/79   Pulse 78   Temp 97.9 F (36.6 C) (Oral)   Wt 105 lb 6.4 oz (47.8 kg)   SpO2 98%   BMI 20.25 kg/m   Wt Readings from Last 3 Encounters:  06/22/20 105 lb 6.4 oz (47.8 kg)  06/06/20 104 lb (47.2 kg)  05/25/20 101 lb 12.8 oz (46.2 kg)    Physical Exam Vitals and nursing note reviewed.  Constitutional:      General: She is awake. She is not in acute distress.    Appearance: She is well-developed and well-groomed. She is not ill-appearing or toxic-appearing.  HENT:     Head: Normocephalic.     Right Ear: Hearing normal.     Left Ear: Hearing normal.  Eyes:     General: Lids are normal.        Right eye: No discharge.        Left  eye: No discharge.     Conjunctiva/sclera: Conjunctivae normal.     Pupils: Pupils are equal, round, and reactive to light.  Neck:     Thyroid: No thyromegaly.     Vascular: No carotid bruit.  Cardiovascular:     Rate and Rhythm: Normal rate and regular rhythm.     Heart sounds: Normal heart sounds. No murmur heard. No gallop.   Pulmonary:     Effort: Pulmonary effort is normal. No accessory muscle usage or respiratory distress.     Breath sounds: Normal breath sounds.  Abdominal:     General: Bowel sounds are normal. There is no distension.     Palpations: Abdomen is soft.     Tenderness: There is no abdominal tenderness.  Musculoskeletal:     Cervical back: Normal range of motion and neck supple.      Right lower leg: No edema.     Left lower leg: No edema.  Skin:    General: Skin is warm and dry.  Neurological:     Mental Status: She is alert and oriented to person, place, and time.     Deep Tendon Reflexes: Reflexes are normal and symmetric.     Reflex Scores:      Brachioradialis reflexes are 2+ on the right side and 2+ on the left side.      Patellar reflexes are 2+ on the right side and 2+ on the left side. Psychiatric:        Attention and Perception: Attention normal.        Mood and Affect: Mood normal.        Speech: Speech normal.        Behavior: Behavior normal. Behavior is cooperative.        Thought Content: Thought content normal.    Results for orders placed or performed in visit on 06/06/20  Cytology - PAP  Result Value Ref Range   Adequacy      Satisfactory for evaluation; transformation zone component PRESENT.   Diagnosis      - Negative for intraepithelial lesion or malignancy (NILM)      Assessment & Plan:   Problem List Items Addressed This Visit      Genitourinary   Prolapse of female pelvic organs    At this time continue to monitor, patient does not want any intervention.  Return to GYN as needed. Avoid heavy lifting at home.        Other   Lichen sclerosus    No current flares, will treat as needed with Clobetasol.            Follow up plan: Return for as scheduled on June 6th for COPD, HTN/HLD -- need spirometry.

## 2020-06-22 NOTE — Patient Instructions (Signed)
Pelvic Organ Prolapse Pelvic organ prolapse is a condition in women that involves the stretching, bulging, or dropping of pelvic organs into an abnormal position, past the opening of the vagina. It happens when the muscles and tissues that surround and support pelvic structures become weak or stretched. Pelvic organ prolapse can involve the:  Vagina (vaginal prolapse).  Uterus (uterine prolapse).  Bladder (cystocele).  Rectum (rectocele).  Intestines (enterocele). When organs other than the vagina are involved, they often bulge into the vagina or protrude from the vagina, depending on how severe the prolapse is. What are the causes? This condition may be caused by:  Pregnancy, labor, and childbirth.  Past pelvic surgery.  Lower levels of the hormone estrogen due to menopause.  Consistently lifting more than 50 lb (23 kg).  Obesity.  Long-term difficulty passing stool (chronic constipation).  Long-term, or chronic, cough.  Fluid buildup in the abdomen due to certain conditions. What are the signs or symptoms? Symptoms of this condition include:  Leaking a little urine (loss of bladder control) when you cough, sneeze, strain, and exercise (stress incontinence). This may be worse immediately after childbirth. It may gradually improve over time.  Feeling pressure in your pelvis or vagina. This pressure may increase when you cough or when you are passing stool.  A bulge that protrudes from the opening of your vagina.  Difficulty passing urine or stool.  Pain in your lower back.  Pain or discomfort during sex, or decreased interest in sex.  Repeated bladder infections (urinary tract infections).  Difficulty inserting a tampon. In some people, this condition causes no symptoms. How is this diagnosed? This condition may be diagnosed based on a vaginal and rectal exam. During the exam, you may be asked to cough and strain while you are lying down, sitting, and standing up.  Your health care provider will determine if other tests are required, such as bladder function tests. How is this treated? Treatment for this condition may depend on your symptoms. Treatment may include:  Lifestyle changes, such as drinking plenty of fluids and eating foods that are high in fiber.  Emptying your bladder at scheduled times (bladder training therapy). This can help reduce or avoid urinary incontinence.  Estrogen. This may help mild prolapse by increasing the strength and tone of pelvic floor muscles.  Kegel exercises. These may help mild cases of prolapse by strengthening and tightening the muscles of the pelvic floor.  A soft, flexible device that helps support the vaginal walls and keep pelvic organs in place (pessary). This is inserted into your vagina by your health care provider.  Surgery. This is often the only form of treatment for severe prolapse. Follow these instructions at home: Eating and drinking  Avoid drinking beverages that contain caffeine or alcohol.  Increase your intake of high-fiber foods to decrease constipation and straining during bowel movements. Activity  Lose weight if recommended by your health care provider.  Avoid heavy lifting and straining with exercise and work. Do not hold your breath when you perform mild to moderate lifting and exercise activities. Limit your activities as directed by your health care provider.  Do Kegel exercises as directed by your health care provider. To do this: ? Squeeze your pelvic floor muscles tight. You should feel a tight lift in your rectal area and a tightness in your vaginal area. Keep your stomach, buttocks, and legs relaxed. ? Hold the muscles tight for up to 10 seconds. Then relax your muscles. ? Repeat this exercise  50 times a day, or as much as told by your health care provider. Continue to do this exercise for at least 4-6 weeks, or for as long as told by your health care provider. General  instructions  Take over-the-counter and prescription medicines only as told by your health care provider.  Wear a sanitary pad or adult diapers if you have urinary incontinence.  If you have a pessary, take care of it as told by your health care provider.  Keep all follow-up visits. This is important. Contact a health care provider if you:  Have symptoms that interfere with your daily activities or sex life.  Need medicine to help with the discomfort.  Notice bleeding from your vagina that is not related to your menstrual period.  Have a fever.  Have pain or bleeding when you urinate.  Have bleeding when you pass stool.  Pass urine when you have sex.  Have chronic constipation.  Have a pessary that falls out.  Have a foul-smelling vaginal discharge.  Have an unusual, low pain in your abdomen. Get help right away if you:  Cannot pass urine. Summary  Pelvic organ prolapse is the stretching, bulging, or dropping of pelvic organs into an abnormal position. It happens when the muscles and tissues that surround and support pelvic structures become weak or stretched.  When organs other than the vagina are involved, they often bulge into the vagina or protrude from it, depending on how severe the prolapse is.  In most cases, this condition needs to be treated only if it produces symptoms. Treatment may include lifestyle changes, estrogen, Kegel exercises, pessary insertion, or surgery.  Avoid heavy lifting and straining with exercise and work. Do not hold your breath when you perform mild to moderate lifting and exercise activities. Limit your activities as directed by your health care provider. This information is not intended to replace advice given to you by your health care provider. Make sure you discuss any questions you have with your health care provider. Document Revised: 09/26/2019 Document Reviewed: 09/26/2019 Elsevier Patient Education  [place].

## 2020-06-22 NOTE — Assessment & Plan Note (Signed)
No current flares, will treat as needed with Clobetasol.

## 2020-06-27 ENCOUNTER — Other Ambulatory Visit: Payer: Self-pay | Admitting: Nurse Practitioner

## 2020-06-27 DIAGNOSIS — Z1231 Encounter for screening mammogram for malignant neoplasm of breast: Secondary | ICD-10-CM

## 2020-07-17 ENCOUNTER — Telehealth: Payer: Self-pay

## 2020-07-17 ENCOUNTER — Other Ambulatory Visit: Payer: Self-pay

## 2020-07-17 ENCOUNTER — Ambulatory Visit
Admission: RE | Admit: 2020-07-17 | Discharge: 2020-07-17 | Disposition: A | Discharge status: Home / Self Care | Payer: Medicare Other | Source: Ambulatory Visit | Attending: Nurse Practitioner | Admitting: Nurse Practitioner

## 2020-07-17 DIAGNOSIS — M810 Age-related osteoporosis without current pathological fracture: Secondary | ICD-10-CM

## 2020-07-17 DIAGNOSIS — Z1231 Encounter for screening mammogram for malignant neoplasm of breast: Secondary | ICD-10-CM

## 2020-07-17 DIAGNOSIS — Z780 Asymptomatic menopausal state: Secondary | ICD-10-CM | POA: Diagnosis not present

## 2020-07-17 NOTE — Telephone Encounter (Signed)
Pt was scheduled to have her mammogram today.

## 2020-07-17 NOTE — Telephone Encounter (Signed)
-----   Message from Gae Dry, MD sent at 06/20/2020  2:07 PM EST ----- Regarding: MMG Received notice she has not received MMG yet as ordered and needed Please check and encourage her to do this, and document conversation.

## 2020-07-17 NOTE — Progress Notes (Signed)
Please let Alvilda know her bone scan has returned and she continues to show osteoporosis, bones being more brittle and risk for fracture.  At this time continue Fosamax and next visit we can discuss a trial of a holiday from this until her next scan is due in 2 years.  Also ensure good calcium intake daily and take Vitamin D3 2000 units daily.  Any questions? Keep being awesome!!  Thank you for allowing me to participate in your care. Kindest regards, Brittan Butterbaugh

## 2020-08-06 ENCOUNTER — Other Ambulatory Visit: Payer: Self-pay | Admitting: Nurse Practitioner

## 2020-08-13 ENCOUNTER — Ambulatory Visit (INDEPENDENT_AMBULATORY_CARE_PROVIDER_SITE_OTHER): Payer: Medicare Other

## 2020-08-13 VITALS — Ht 60.0 in | Wt 101.0 lb

## 2020-08-13 DIAGNOSIS — Z0000 Encounter for general adult medical examination without abnormal findings: Secondary | ICD-10-CM | POA: Diagnosis not present

## 2020-08-13 NOTE — Patient Instructions (Signed)
Lauren Nolan , Thank you for taking time to come for your Medicare Wellness Visit. I appreciate your ongoing commitment to your health goals. Please review the following plan we discussed and let me know if I can assist you in the future.   Screening recommendations/referrals: Colonoscopy: completed 10/03/2019, due 10/03/2022 Mammogram: completed 07/17/2020 Bone Density: completed 07/17/2020 Recommended yearly ophthalmology/optometry visit for glaucoma screening and checkup Recommended yearly dental visit for hygiene and checkup  Vaccinations: Influenza vaccine: completed 01/06/2020, due 11/12/2020 Pneumococcal vaccine: completed 07/05/2014 Tdap vaccine:  due Shingles vaccine: completed   Covid-19: 04/26/2020, 06/08/2019, 05/10/2019  Advanced directives: Please bring a copy of your POA (Power of Attorney) and/or Living Will to your next appointment.   Conditions/risks identified: smoking  Next appointment: Follow up in one year for your annual wellness visit    Preventive Care 74 Years and Older, Female Preventive care refers to lifestyle choices and visits with your health care provider that can promote health and wellness. What does preventive care include?  A yearly physical exam. This is also called an annual well check.  Dental exams once or twice a year.  Routine eye exams. Ask your health care provider how often you should have your eyes checked.  Personal lifestyle choices, including:  Daily care of your teeth and gums.  Regular physical activity.  Eating a healthy diet.  Avoiding tobacco and drug use.  Limiting alcohol use.  Practicing safe sex.  Taking low-dose aspirin every day.  Taking vitamin and mineral supplements as recommended by your health care provider. What happens during an annual well check? The services and screenings done by your health care provider during your annual well check will depend on your age, overall health, lifestyle risk factors, and family  history of disease. Counseling  Your health care provider may ask you questions about your:  Alcohol use.  Tobacco use.  Drug use.  Emotional well-being.  Home and relationship well-being.  Sexual activity.  Eating habits.  History of falls.  Memory and ability to understand (cognition).  Work and work Statistician.  Reproductive health. Screening  You may have the following tests or measurements:  Height, weight, and BMI.  Blood pressure.  Lipid and cholesterol levels. These may be checked every 5 years, or more frequently if you are over 26 years old.  Skin check.  Lung cancer screening. You may have this screening every year starting at age 13 if you have a 30-pack-year history of smoking and currently smoke or have quit within the past 15 years.  Fecal occult blood test (FOBT) of the stool. You may have this test every year starting at age 27.  Flexible sigmoidoscopy or colonoscopy. You may have a sigmoidoscopy every 5 years or a colonoscopy every 10 years starting at age 54.  Hepatitis C blood test.  Hepatitis B blood test.  Sexually transmitted disease (STD) testing.  Diabetes screening. This is done by checking your blood sugar (glucose) after you have not eaten for a while (fasting). You may have this done every 1-3 years.  Bone density scan. This is done to screen for osteoporosis. You may have this done starting at age 47.  Mammogram. This may be done every 1-2 years. Talk to your health care provider about how often you should have regular mammograms. Talk with your health care provider about your test results, treatment options, and if necessary, the need for more tests. Vaccines  Your health care provider may recommend certain vaccines, such as:  Influenza  vaccine. This is recommended every year.  Tetanus, diphtheria, and acellular pertussis (Tdap, Td) vaccine. You may need a Td booster every 10 years.  Zoster vaccine. You may need this after  age 50.  Pneumococcal 13-valent conjugate (PCV13) vaccine. One dose is recommended after age 36.  Pneumococcal polysaccharide (PPSV23) vaccine. One dose is recommended after age 87. Talk to your health care provider about which screenings and vaccines you need and how often you need them. This information is not intended to replace advice given to you by your health care provider. Make sure you discuss any questions you have with your health care provider. Document Released: 04/27/2015 Document Revised: 12/19/2015 Document Reviewed: 01/30/2015 Elsevier Interactive Patient Education  2017 [place] Prevention in the Home Falls can cause injuries. They can happen to people of all ages. There are many things you can do to make your home safe and to help prevent falls. What can I do on the outside of my home?  Regularly fix the edges of walkways and driveways and fix any cracks.  Remove anything that might make you trip as you walk through a door, such as a raised step or threshold.  Trim any bushes or trees on the path to your home.  Use bright outdoor lighting.  Clear any walking paths of anything that might make someone trip, such as rocks or tools.  Regularly check to see if handrails are loose or broken. Make sure that both sides of any steps have handrails.  Any raised decks and porches should have guardrails on the edges.  Have any leaves, snow, or ice cleared regularly.  Use sand or salt on walking paths during winter.  Clean up any spills in your garage right away. This includes oil or grease spills. What can I do in the bathroom?  Use night lights.  Install grab bars by the toilet and in the tub and shower. Do not use towel bars as grab bars.  Use non-skid mats or decals in the tub or shower.  If you need to sit down in the shower, use a plastic, non-slip stool.  Keep the floor dry. Clean up any water that spills on the floor as soon as it  happens.  Remove soap buildup in the tub or shower regularly.  Attach bath mats securely with double-sided non-slip rug tape.  Do not have throw rugs and other things on the floor that can make you trip. What can I do in the bedroom?  Use night lights.  Make sure that you have a light by your bed that is easy to reach.  Do not use any sheets or blankets that are too big for your bed. They should not hang down onto the floor.  Have a firm chair that has side arms. You can use this for support while you get dressed.  Do not have throw rugs and other things on the floor that can make you trip. What can I do in the kitchen?  Clean up any spills right away.  Avoid walking on wet floors.  Keep items that you use a lot in easy-to-reach places.  If you need to reach something above you, use a strong step stool that has a grab bar.  Keep electrical cords out of the way.  Do not use floor polish or wax that makes floors slippery. If you must use wax, use non-skid floor wax.  Do not have throw rugs and other things on the floor that can  make you trip. What can I do with my stairs?  Do not leave any items on the stairs.  Make sure that there are handrails on both sides of the stairs and use them. Fix handrails that are broken or loose. Make sure that handrails are as long as the stairways.  Check any carpeting to make sure that it is firmly attached to the stairs. Fix any carpet that is loose or worn.  Avoid having throw rugs at the top or bottom of the stairs. If you do have throw rugs, attach them to the floor with carpet tape.  Make sure that you have a light switch at the top of the stairs and the bottom of the stairs. If you do not have them, ask someone to add them for you. What else can I do to help prevent falls?  Wear shoes that:  Do not have high heels.  Have rubber bottoms.  Are comfortable and fit you well.  Are closed at the toe. Do not wear sandals.  If you  use a stepladder:  Make sure that it is fully opened. Do not climb a closed stepladder.  Make sure that both sides of the stepladder are locked into place.  Ask someone to hold it for you, if possible.  Clearly mark and make sure that you can see:  Any grab bars or handrails.  First and last steps.  Where the edge of each step is.  Use tools that help you move around (mobility aids) if they are needed. These include:  Canes.  Walkers.  Scooters.  Crutches.  Turn on the lights when you go into a dark area. Replace any light bulbs as soon as they burn out.  Set up your furniture so you have a clear path. Avoid moving your furniture around.  If any of your floors are uneven, fix them.  If there are any pets around you, be aware of where they are.  Review your medicines with your doctor. Some medicines can make you feel dizzy. This can increase your chance of falling. Ask your doctor what other things that you can do to help prevent falls. This information is not intended to replace advice given to you by your health care provider. Make sure you discuss any questions you have with your health care provider. Document Released: 01/25/2009 Document Revised: 09/06/2015 Document Reviewed: 05/05/2014 Elsevier Interactive Patient Education  2017 [employer].

## 2020-08-13 NOTE — Progress Notes (Signed)
I connected with Lauren Nolan today by telephone and verified that I am speaking with the correct person using two identifiers. Location patient: home Location provider: work Persons participating in the virtual visit: Robina, Hamor LPN.   I discussed the limitations, risks, security and privacy concerns of performing an evaluation and management service by telephone and the availability of in person appointments. I also discussed with the patient that there may be a patient responsible charge related to this service. The patient expressed understanding and verbally consented to this telephonic visit.    Interactive audio and video telecommunications were attempted between this provider and patient, however failed, due to patient having technical difficulties OR patient did not have access to video capability.  We continued and completed visit with audio only.     Vital signs may be patient reported or missing.  Subjective:   Lauren Nolan is a 75 y.o. female who presents for Medicare Annual (Subsequent) preventive examination.  Review of Systems     Cardiac Risk Factors include: advanced age (>1men, >17 women);hypertension;sedentary lifestyle;smoking/ tobacco exposure     Objective:    Today's Vitals   08/13/20 0941  Weight: 101 lb (45.8 kg)  Height: 5' (1.524 m)   Body mass index is 19.73 kg/m.  Advanced Directives 08/13/2020 10/03/2019 06/07/2018 05/07/2016 05/07/2015  Does Patient Have a Medical Advance Directive? Yes No No No No  Type of Advance Directive Living will;Healthcare Power of Attorney - - - -  Copy of [place] in Chart? No - copy requested - - - -  Would patient like information on creating a medical advance directive? - No - Patient declined No - Patient declined - -    Current Medications (verified) Outpatient Encounter Medications as of 08/13/2020  Medication Sig  . albuterol (VENTOLIN HFA) 108 (90 Base) MCG/ACT inhaler Inhale  1-2 puffs into the lungs every 6 (six) hours as needed for wheezing or shortness of breath.  Marland Kitchen alendronate (FOSAMAX) 70 MG tablet Take 1 tablet (70 mg total) by mouth once a week. Take with a full glass of water on an empty stomach.  Marland Kitchen amLODipine (NORVASC) 10 MG tablet Take 1 tablet (10 mg total) by mouth daily.  Marland Kitchen aspirin 81 MG tablet Take 81 mg by mouth daily.  . fluticasone (FLONASE) 50 MCG/ACT nasal spray Place 2 sprays into both nostrils daily.  Marland Kitchen olmesartan (BENICAR) 40 MG tablet Take 1 tablet (40 mg total) by mouth daily.  Marland Kitchen omeprazole (PRILOSEC) 20 MG capsule Take 1 capsule (20 mg total) by mouth daily.  Marland Kitchen venlafaxine XR (EFFEXOR-XR) 75 MG 24 hr capsule Take 1 capsule (75 mg total) by mouth daily with breakfast.   No facility-administered encounter medications on file as of 08/13/2020.    Allergies (verified) Elemental sulfur and Midol [acetaminophen]   History: Past Medical History:  Diagnosis Date  . Allergy   . Anxiety   . COPD (chronic obstructive pulmonary disease) ([place])   . Depression   . GERD (gastroesophageal reflux disease)   . Hypertension   . Internal hemorrhoid   . Lichen sclerosus   . Osteoporosis   . Spinal stenosis   . Tobacco use    Past Surgical History:  Procedure Laterality Date  . BREAST EXCISIONAL BIOPSY Bilateral    1980's - benign  . BREAST SURGERY Bilateral    biopsy  . COLONOSCOPY WITH PROPOFOL N/A 10/03/2019   Procedure: COLONOSCOPY WITH PROPOFOL;  Surgeon: Jonathon Bellows, MD;  Location: [facility]  ENDOSCOPY;  Service: Gastroenterology;  Laterality: N/A;  . EYE SURGERY Bilateral Feb-Mar 2016  . HERNIA REPAIR    . TUBAL LIGATION     Family History  Problem Relation Age of Onset  . Dementia Mother   . Anxiety disorder Mother   . Hypertension Mother   . Epilepsy Mother   . Cancer Father        lung  . Diabetes Brother   . Colon cancer Brother   . Hypertension Daughter   . Cancer Daughter   . Heart disease Maternal Grandfather        MI  .  Hypertension Brother   . Diabetes Daughter   . Arthritis Daughter   . Breast cancer Neg Hx    Social History   Socioeconomic History  . Marital status: Married    Spouse name: Not on file  . Number of children: Not on file  . Years of education: Not on file  . Highest education level: 8th grade  Occupational History  . Occupation: retired  Tobacco Use  . Smoking status: Current Every Day Smoker    Packs/day: 0.25    Types: Cigarettes  . Smokeless tobacco: Never Used  . Tobacco comment: 1 pack every 3 days   Vaping Use  . Vaping Use: Never used  Substance and Sexual Activity  . Alcohol use: Yes    Alcohol/week: 3.0 standard drinks    Types: 3 Glasses of wine per week  . Drug use: No  . Sexual activity: Yes  Other Topics Concern  . Not on file  Social History Narrative   Retired, has daughter living at home with her and her husband    Social Determinants of Radio broadcast assistant Strain: Low Risk   . Difficulty of Paying Living Expenses: Not hard at all  Food Insecurity: No Food Insecurity  . Worried About Charity fundraiser in the Last Year: Never true  . Ran Out of Food in the Last Year: Never true  Transportation Needs: No Transportation Needs  . Lack of Transportation (Medical): No  . Lack of Transportation (Non-Medical): No  Physical Activity: Inactive  . Days of Exercise per Week: 0 days  . Minutes of Exercise per Session: 0 min  Stress: No Stress Concern Present  . Feeling of Stress : Not at all  Social Connections: Not on file    Tobacco Counseling Ready to quit: Yes Counseling given: Not Answered Comment: 1 pack every 3 days    Clinical Intake:  Pre-visit preparation completed: Yes  Pain : No/denies pain     Nutritional Status: BMI of 19-24  Normal Nutritional Risks: Nausea/ vomitting/ diarrhea (nausea and diarrhea has been on going) Diabetes: No  How often do you need to have someone help you when you read instructions, pamphlets, or  other written materials from your doctor or pharmacy?: 1 - Never What is the last grade level you completed in school?: 8th grade  Diabetic? no  Interpreter Needed?: No  Information entered by :: NAllen LPN   Activities of Daily Living In your present state of health, do you have any difficulty performing the following activities: 08/13/2020 03/19/2020  Hearing? N N  Vision? N N  Difficulty concentrating or making decisions? Y N  Comment keeps a journal -  Walking or climbing stairs? N N  Dressing or bathing? N N  Doing errands, shopping? N N  Preparing Food and eating ? N -  Using the Toilet? N -  In the past six months, have you accidently leaked urine? Y -  Comment with a cough sometimes -  Do you have problems with loss of bowel control? N -  Managing your Medications? N -  Managing your Finances? N -  Housekeeping or managing your Housekeeping? N -  Some recent data might be hidden    Patient Care Team: Venita Lick, NP as PCP - General (Nurse Practitioner) Leandrew Koyanagi, MD as Referring Physician (Ophthalmology) Arelia Sneddon, OD (Optometry)  Indicate any recent Medical Services you may have received from other than [facility] providers in the past year (date may be approximate).     Assessment:   This is a routine wellness examination for Lauren Nolan.  Hearing/Vision screen No exam data present  Dietary issues and exercise activities discussed: Current Exercise Habits: The patient does not participate in regular exercise at present  Goals Addressed            This Visit's Progress   . Patient Stated       08/13/2020, no goals      Depression Screen PHQ 2/9 Scores 08/13/2020 05/25/2020 03/19/2020 09/16/2019 08/10/2019 03/17/2019 06/07/2018  PHQ - 2 Score 0 0 0 0 0 0 0  PHQ- 9 Score - - 0 1 - 1 -    Fall Risk Fall Risk  08/13/2020 05/25/2020 08/10/2019 03/17/2019 06/07/2018  Falls in the past year? 0 0 1 0 0  Number falls in past yr: - - 0 0 -  Comment - - dog  knocked her down - -  Injury with Fall? - - 1 0 -  Risk for fall due to : Medication side effect - - - -  Follow up Falls evaluation completed;Education provided;Falls prevention discussed - - Falls evaluation completed -    FALL RISK PREVENTION PERTAINING TO THE HOME:  Any stairs in or around the home? Yes  If so, are there any without handrails? No  Home free of loose throw rugs in walkways, pet beds, electrical cords, etc? Yes  Adequate lighting in your home to reduce risk of falls? Yes   ASSISTIVE DEVICES UTILIZED TO PREVENT FALLS:  Life alert? No  Use of a cane, walker or w/c? No  Grab bars in the bathroom? No  Shower chair or bench in shower? No  Elevated toilet seat or a handicapped toilet? No   TIMED UP AND GO:  Was the test performed? No   Cognitive Function:     6CIT Screen 08/13/2020 06/07/2018  What Year? 0 points 0 points  What month? 0 points 0 points  What time? 0 points 0 points  Count back from 20 0 points 0 points  Months in reverse 4 points 0 points  Repeat phrase 10 points 0 points  Total Score 14 0    Immunizations Immunization History  Administered Date(s) Administered  . Fluad Quad(high Dose 65+) 01/07/2019  . Influenza, High Dose Seasonal PF 12/16/2017  . Influenza-Unspecified 01/11/2015, 01/11/2016, 12/31/2016, 01/06/2020  . Moderna Sars-Covid-2 Vaccination 05/10/2019, 06/08/2019, 04/26/2020  . Pneumococcal Conjugate-13 08/22/2013  . Pneumococcal Polysaccharide-23 07/05/2014  . Tdap 06/07/2010  . Zoster 04/11/2008  . Zoster Recombinat (Shingrix) 01/20/2019, 04/25/2019    TDAP status: Due, Education has been provided regarding the importance of this vaccine. Advised may receive this vaccine at local pharmacy or Health Dept. Aware to provide a copy of the vaccination record if obtained from local pharmacy or Health Dept. Verbalized acceptance and understanding.  Flu Vaccine status: Up to date  Pneumococcal vaccine status: Up to  date  Covid-19 vaccine status: Completed vaccines  Qualifies for Shingles Vaccine? Yes   Zostavax completed Yes   Shingrix Completed?: Yes  Screening Tests Health Maintenance  Topic Date Due  . TETANUS/TDAP  06/07/2020  . COVID-19 Vaccine (4 - Booster for Moderna series) 10/24/2020  . INFLUENZA VACCINE  11/12/2020  . MAMMOGRAM  07/18/2022  . COLONOSCOPY (Pts 45-13yrs Insurance coverage will need to be confirmed)  10/03/2022  . DEXA SCAN  Completed  . Hepatitis C Screening  Completed  . PNA vac Low Risk Adult  Completed  . HPV VACCINES  Aged Out    Health Maintenance  Health Maintenance Due  Topic Date Due  . TETANUS/TDAP  06/07/2020    Colorectal cancer screening: Type of screening: Colonoscopy. Completed 10/03/2019. Repeat every 3 years  Mammogram status: Completed 07/17/2020. Repeat every year  Bone Density status: Completed 07/17/2020.   Lung Cancer Screening: (Low Dose CT Chest recommended if Age 89-80 years, 30 pack-year currently smoking OR have quit w/in 15years.) does not qualify.   Lung Cancer Screening Referral: no  Additional Screening:  Hepatitis C Screening: does qualify; Completed 05/07/2015  Vision Screening: Recommended annual ophthalmology exams for early detection of glaucoma and other disorders of the eye. Is the patient up to date with their annual eye exam?  No  Who is the provider or what is the name of the office in which the patient attends annual eye exams? [facility] If pt is not established with a provider, would they like to be referred to a provider to establish care? No .   Dental Screening: Recommended annual dental exams for proper oral hygiene  Community Resource Referral / Chronic Care Management: CRR required this visit?  No   CCM required this visit?  No      Plan:     I have personally reviewed and noted the following in the patient's chart:   . Medical and social history . Use of alcohol, tobacco or illicit drugs   . Current medications and supplements including opioid prescriptions.  . Functional ability and status . Nutritional status . Physical activity . Advanced directives . List of other physicians . Hospitalizations, surgeries, and ER visits in previous 12 months . Vitals . Screenings to include cognitive, depression, and falls . Referrals and appointments  In addition, I have reviewed and discussed with patient certain preventive protocols, quality metrics, and best practice recommendations. A written personalized care plan for preventive services as well as general preventive health recommendations were provided to patient.     Kellie Simmering, LPN   11/18/5641   Nurse Notes:

## 2020-09-07 ENCOUNTER — Telehealth: Payer: Self-pay

## 2020-09-07 NOTE — Telephone Encounter (Signed)
Called and let patient know that we do have the tetanus vaccine here. Advised her that we could possibly do this at her upcoming appointment but that Medicare does not typically cover this.

## 2020-09-07 NOTE — Telephone Encounter (Signed)
Copied from [place]. Topic: Appointment Scheduling - Scheduling Inquiry for Clinic >> Sep 07, 2020  1:47 PM Greggory Keen D wrote: Reason for CRM: Pt called asking if she could come in for a tetanus shot.   Believe on the one note it states office does not do tetanus.  CB#  [phone]

## 2020-09-11 ENCOUNTER — Encounter: Payer: Self-pay | Admitting: Nurse Practitioner

## 2020-09-11 ENCOUNTER — Telehealth (INDEPENDENT_AMBULATORY_CARE_PROVIDER_SITE_OTHER): Payer: Medicare Other | Admitting: Nurse Practitioner

## 2020-09-11 VITALS — BP 120/71 | HR 113

## 2020-09-11 DIAGNOSIS — W57XXXA Bitten or stung by nonvenomous insect and other nonvenomous arthropods, initial encounter: Secondary | ICD-10-CM

## 2020-09-11 DIAGNOSIS — J069 Acute upper respiratory infection, unspecified: Secondary | ICD-10-CM

## 2020-09-11 MED ORDER — DOXYCYCLINE HYCLATE 100 MG PO TABS: 100.0000 mg | ORAL_TABLET | Freq: Two times a day (BID) | ORAL | 0 refills | Status: DC

## 2020-09-11 NOTE — Progress Notes (Signed)
Acute Office Visit  Subjective:    Patient ID: Lauren Nolan, female    DOB: 1945-05-25, 75 y.o.   MRN: [id number]  Chief Complaint  Patient presents with  . Cough  . Fever    Since past Friday (5/27), could not sleep, 103 degrees fever on Saturday Has been taking Tylenol.   . Insect Bite    Took off tick on 5/23   . Sore Muscles  . Headache  . Diarrhea  . COPD    HPI Patient is in today for fever, sore muscles, and slight headache since Friday 09/07/20. Of note, she pulled a tick off her 09/03/20.   UPPER RESPIRATORY TRACT INFECTION  Worst symptom: fevers Fever: yes Cough: yes, improving Shortness of breath: no Wheezing: no Chest pain: no Chest tightness: no Chest congestion: no Nasal congestion: yes Runny nose: no Post nasal drip: yes Sneezing: no Sore throat: no Swollen glands: no Sinus pressure: no Headache: yes Face pain: no Toothache: no Ear pain: no  Ear pressure: no Eyes red/itching:no Eye drainage/crusting: no  Vomiting: no Rash: no Fatigue: yes Sick contacts: no Strep contacts: no  Context: fluctuating Recurrent sinusitis: no Relief with OTC cold/cough medications: some  Treatments attempted: tylenol     Past Medical History:  Diagnosis Date  . Allergy   . Anxiety   . COPD (chronic obstructive pulmonary disease) ([place])   . Depression   . GERD (gastroesophageal reflux disease)   . Hypertension   . Internal hemorrhoid   . Lichen sclerosus   . Osteoporosis   . Spinal stenosis   . Tobacco use     Past Surgical History:  Procedure Laterality Date  . BREAST EXCISIONAL BIOPSY Bilateral    1980's - benign  . BREAST SURGERY Bilateral    biopsy  . COLONOSCOPY WITH PROPOFOL N/A 10/03/2019   Procedure: COLONOSCOPY WITH PROPOFOL;  Surgeon: Jonathon Bellows, MD;  Location: [facility] ENDOSCOPY;  Service: Gastroenterology;  Laterality: N/A;  . EYE SURGERY Bilateral Feb-Mar 2016  . HERNIA REPAIR    . TUBAL LIGATION      Family History  Problem  Relation Age of Onset  . Dementia Mother   . Anxiety disorder Mother   . Hypertension Mother   . Epilepsy Mother   . Cancer Father        lung  . Diabetes Brother   . Colon cancer Brother   . Hypertension Daughter   . Cancer Daughter   . Heart disease Maternal Grandfather        MI  . Hypertension Brother   . Diabetes Daughter   . Arthritis Daughter   . Breast cancer Neg Hx     Social History   Socioeconomic History  . Marital status: Married    Spouse name: Not on file  . Number of children: Not on file  . Years of education: Not on file  . Highest education level: 8th grade  Occupational History  . Occupation: retired  Tobacco Use  . Smoking status: Current Every Day Smoker    Packs/day: 0.25    Types: Cigarettes  . Smokeless tobacco: Never Used  . Tobacco comment: 1 pack every 3 days   Vaping Use  . Vaping Use: Never used  Substance and Sexual Activity  . Alcohol use: Yes    Alcohol/week: 3.0 standard drinks    Types: 3 Glasses of wine per week  . Drug use: No  . Sexual activity: Yes  Other Topics Concern  . Not on  file  Social History Narrative   Retired, has daughter living at home with her and her husband    Social Determinants of Radio broadcast assistant Strain: Low Risk   . Difficulty of Paying Living Expenses: Not hard at all  Food Insecurity: No Food Insecurity  . Worried About Charity fundraiser in the Last Year: Never true  . Ran Out of Food in the Last Year: Never true  Transportation Needs: No Transportation Needs  . Lack of Transportation (Medical): No  . Lack of Transportation (Non-Medical): No  Physical Activity: Inactive  . Days of Exercise per Week: 0 days  . Minutes of Exercise per Session: 0 min  Stress: No Stress Concern Present  . Feeling of Stress : Not at all  Social Connections: Not on file  Intimate Partner Violence: Not on file    Outpatient Medications Prior to Visit  Medication Sig Dispense Refill  . albuterol  (VENTOLIN HFA) 108 (90 Base) MCG/ACT inhaler Inhale 1-2 puffs into the lungs every 6 (six) hours as needed for wheezing or shortness of breath. 18 g 3  . alendronate (FOSAMAX) 70 MG tablet Take 1 tablet (70 mg total) by mouth once a week. Take with a full glass of water on an empty stomach. 90 tablet 3  . amLODipine (NORVASC) 10 MG tablet Take 1 tablet (10 mg total) by mouth daily. 90 tablet 4  . aspirin 81 MG tablet Take 81 mg by mouth daily.    . fluticasone (FLONASE) 50 MCG/ACT nasal spray Place 2 sprays into both nostrils daily. 16 g 6  . olmesartan (BENICAR) 40 MG tablet Take 1 tablet (40 mg total) by mouth daily. 90 tablet 0  . omeprazole (PRILOSEC) 20 MG capsule Take 1 capsule (20 mg total) by mouth daily. 90 capsule 4  . venlafaxine XR (EFFEXOR-XR) 75 MG 24 hr capsule Take 1 capsule (75 mg total) by mouth daily with breakfast. 90 capsule 4   No facility-administered medications prior to visit.    Allergies  Allergen Reactions  . Elemental Sulfur   . Midol [Acetaminophen] Hypertension    Review of Systems  Constitutional: Positive for chills, fatigue and fever.  HENT: Positive for congestion, postnasal drip and rhinorrhea. Negative for sore throat.   Eyes: Negative.   Respiratory: Negative.   Cardiovascular: Negative.   Gastrointestinal: Positive for diarrhea.       Objective:    Physical Exam Vitals and nursing note reviewed.  Pulmonary:     Effort: Pulmonary effort is normal.     Comments: Able to talk in complete sentences Neurological:     Mental Status: She is alert and oriented to person, place, and time.  Psychiatric:        Thought Content: Thought content normal.     BP 120/71   Pulse (!) 113  Wt Readings from Last 3 Encounters:  08/13/20 101 lb (45.8 kg)  06/22/20 105 lb 6.4 oz (47.8 kg)  06/06/20 104 lb (47.2 kg)    Health Maintenance Due  Topic Date Due  . TETANUS/TDAP  06/07/2020  . COVID-19 Vaccine (4 - Booster for Moderna series) 07/25/2020     There are no preventive care reminders to display for this patient.   Lab Results  Component Value Date   TSH 2.670 03/19/2020   Lab Results  Component Value Date   WBC 8.9 03/19/2020   HGB 14.7 03/19/2020   HCT 43.2 03/19/2020   MCV 96 03/19/2020   PLT 285  03/19/2020   Lab Results  Component Value Date   NA 137 03/19/2020   K 3.9 03/19/2020   CO2 24 03/19/2020   GLUCOSE 88 03/19/2020   BUN 8 03/19/2020   CREATININE 0.79 03/19/2020   BILITOT 0.7 03/19/2020   ALKPHOS 61 03/19/2020   AST 24 03/19/2020   ALT 15 03/19/2020   PROT 7.0 03/19/2020   ALBUMIN 4.7 03/19/2020   CALCIUM 9.7 03/19/2020   Lab Results  Component Value Date   CHOL 165 03/19/2020   Lab Results  Component Value Date   HDL 98 03/19/2020   Lab Results  Component Value Date   LDLCALC 53 03/19/2020   Lab Results  Component Value Date   TRIG 72 03/19/2020   No results found for: CHOLHDL No results found for: HGBA1C     Assessment & Plan:   Problem List Items Addressed This Visit   None   Visit Diagnoses    Upper respiratory tract infection, unspecified type    -  Primary   Will check covid-19. Has had vaccine x3. Encourage fluids, rest, and can take ibuprofen and tylenol for fever and body aches. F/U if symptoms worsen   Relevant Orders   Novel Coronavirus, NAA ([facility])   Lyme Disease Serology w/Reflex   Rocky mtn spotted fvr abs pnl(IgG+IgM)   Tick bite, unspecified site, initial encounter       will check lyme and rocky [place] spotted fever. Doxycyline sent to pharmacy.   Relevant Orders   Lyme Disease Serology w/Reflex   Rocky mtn spotted fvr abs pnl(IgG+IgM)       Meds ordered this encounter  Medications  . doxycycline (VIBRA-TABS) 100 MG tablet    Sig: Take 1 tablet (100 mg total) by mouth 2 (two) times daily.    Dispense:  28 tablet    Refill:  0    . This visit was completed via telephone due to the restrictions of the COVID-19 pandemic. All issues as above  were discussed and addressed but no physical exam was performed. If it was felt that the patient should be evaluated in the office, they were directed there. The patient verbally consented to this visit. Patient was unable to complete an audio/visual visit due to Technical difficulties, Lack of internet. Due to the catastrophic nature of the COVID-19 pandemic, this visit was done through audio contact only. . Location of the patient: home . Location of the provider: work . Those involved with this call:  . Provider: Billy Fischer, DNP . CMA: Frazier Butt, CMA . Front Desk/Registration: Jill Side  . Time spent on call: 15 minutes on the phone discussing health concerns. 10 minutes total spent in review of patient's record and preparation of their chart.   Charyl Dancer, NP

## 2020-09-11 NOTE — Patient Instructions (Signed)

## 2020-09-11 NOTE — Addendum Note (Signed)
Addended byRudie Meyer on: 09/11/2020 04:31 PM   Modules accepted: Orders

## 2020-09-13 LAB — SARS-COV-2, NAA 2 DAY TAT

## 2020-09-13 LAB — NOVEL CORONAVIRUS, NAA: SARS-CoV-2, NAA: NOT DETECTED

## 2020-09-15 LAB — ROCKY MTN SPOTTED FVR ABS PNL(IGG+IGM)
RMSF IgG: NEGATIVE
RMSF IgM: 2.24 index — ABNORMAL HIGH (ref 0.00–0.89)

## 2020-09-15 LAB — LYME DISEASE SEROLOGY W/REFLEX: Lyme Total Antibody EIA: NEGATIVE

## 2020-09-17 ENCOUNTER — Ambulatory Visit: Payer: Medicare Other | Admitting: Nurse Practitioner

## 2020-10-02 ENCOUNTER — Ambulatory Visit (INDEPENDENT_AMBULATORY_CARE_PROVIDER_SITE_OTHER): Payer: Medicare Other | Admitting: Nurse Practitioner

## 2020-10-02 ENCOUNTER — Encounter: Payer: Self-pay | Admitting: Nurse Practitioner

## 2020-10-02 ENCOUNTER — Other Ambulatory Visit: Payer: Self-pay

## 2020-10-02 VITALS — BP 92/60 | HR 70 | Temp 98.1°F | Wt 101.6 lb

## 2020-10-02 DIAGNOSIS — F329 Major depressive disorder, single episode, unspecified: Secondary | ICD-10-CM | POA: Diagnosis not present

## 2020-10-02 DIAGNOSIS — M810 Age-related osteoporosis without current pathological fracture: Secondary | ICD-10-CM

## 2020-10-02 DIAGNOSIS — I10 Essential (primary) hypertension: Secondary | ICD-10-CM

## 2020-10-02 DIAGNOSIS — Z23 Encounter for immunization: Secondary | ICD-10-CM

## 2020-10-02 DIAGNOSIS — F411 Generalized anxiety disorder: Secondary | ICD-10-CM

## 2020-10-02 DIAGNOSIS — F17210 Nicotine dependence, cigarettes, uncomplicated: Secondary | ICD-10-CM

## 2020-10-02 DIAGNOSIS — J410 Simple chronic bronchitis: Secondary | ICD-10-CM | POA: Diagnosis not present

## 2020-10-02 MED ORDER — AMLODIPINE BESYLATE 5 MG PO TABS: 5.0000 mg | ORAL_TABLET | Freq: Every day | ORAL | 4 refills | Status: DC

## 2020-10-02 NOTE — Assessment & Plan Note (Signed)
Chronic, stable.  Denies SI/HI.  Continue Effexor 75 MG daily as is beneficial at this time, could consider change to SSRI if elevation in BP ever presents.  At this time return in 6 months for annual physical, sooner if worsening mood.

## 2020-10-02 NOTE — Assessment & Plan Note (Signed)
I have recommended complete cessation of tobacco use. I have discussed various options available for assistance with tobacco cessation including over the counter methods (Nicotine gum, patch and lozenges). We also discussed prescription options (Chantix, Nicotine Inhaler / Nasal Spray). The patient is not interested in pursuing any prescription tobacco cessation options at this time.  

## 2020-10-02 NOTE — Assessment & Plan Note (Signed)
Ongoing, continue Fosamax.  May consider period off Fosamax in future, at this time continue due to ongoing osteoporosis on imaging.  Vit D level annually.

## 2020-10-02 NOTE — Patient Instructions (Signed)
Decrease Amlodipine to 5 MG daily -- I have sent script in for you for lower dosing.  Goal blood pressure is less then 130/80.  [email or web address]">  DASH Eating Plan DASH stands for Dietary Approaches to Stop Hypertension. The DASH eating plan is a healthy eating plan that has been shown to: Reduce high blood pressure (hypertension). Reduce your risk for type 2 diabetes, heart disease, and stroke. Help with weight loss. What are tips for following this plan? Reading food labels Check food labels for the amount of salt (sodium) per serving. Choose foods with less than 5 percent of the Daily Value of sodium. Generally, foods with less than 300 milligrams (mg) of sodium per serving fit into this eating plan. To find whole grains, look for the word "whole" as the first word in the ingredient list. Shopping Buy products labeled as "low-sodium" or "no salt added." Buy fresh foods. Avoid canned foods and pre-made or frozen meals. Cooking Avoid adding salt when cooking. Use salt-free seasonings or herbs instead of table salt or sea salt. Check with your health care provider or pharmacist before using salt substitutes. Do not fry foods. Cook foods using healthy methods such as baking, boiling, grilling, roasting, and broiling instead. Cook with heart-healthy oils, such as olive, canola, avocado, soybean, or sunflower oil. Meal planning  Eat a balanced diet that includes: 4 or more servings of fruits and 4 or more servings of vegetables each day. Try to fill one-half of your plate with fruits and vegetables. 6-8 servings of whole grains each day. Less than 6 oz (170 g) of lean meat, poultry, or fish each day. A 3-oz (85-g) serving of meat is about the same size as a deck of cards. One egg equals 1 oz (28 g). 2-3 servings of low-fat dairy each day. One serving is 1 cup (237 mL). 1 serving of nuts, seeds, or beans 5 times each week. 2-3 servings of  heart-healthy fats. Healthy fats called omega-3 fatty acids are found in foods such as walnuts, flaxseeds, fortified milks, and eggs. These fats are also found in cold-water fish, such as sardines, salmon, and mackerel. Limit how much you eat of: Canned or prepackaged foods. Food that is high in trans fat, such as some fried foods. Food that is high in saturated fat, such as fatty meat. Desserts and other sweets, sugary drinks, and other foods with added sugar. Full-fat dairy products. Do not salt foods before eating. Do not eat more than 4 egg yolks a week. Try to eat at least 2 vegetarian meals a week. Eat more home-cooked food and less restaurant, buffet, and fast food.  Lifestyle When eating at a restaurant, ask that your food be prepared with less salt or no salt, if possible. If you drink alcohol: Limit how much you use to: 0-1 drink a day for women who are not pregnant. 0-2 drinks a day for men. Be aware of how much alcohol is in your drink. In the [place] one drink equals one 12 oz bottle of beer (355 mL), one 5 oz glass of wine (148 mL), or one 1 oz glass of hard liquor (44 mL). General information Avoid eating more than 2,300 mg of salt a day. If you have hypertension, you may need to reduce your sodium intake to 1,500 mg a day. Work with your health care provider to maintain a healthy body weight or to lose weight. Ask what an ideal weight is for you. Get at least 30 minutes of exercise that  causes your heart to beat faster (aerobic exercise) most days of the week. Activities may include walking, swimming, or biking. Work with your health care provider or dietitian to adjust your eating plan to your individual calorie needs. What foods should I eat? Fruits All fresh, dried, or frozen fruit. Canned fruit in natural juice (without addedsugar). Vegetables Fresh or frozen vegetables (raw, steamed, roasted, or grilled). Low-sodium or reduced-sodium tomato and vegetable juice.  Low-sodium or reduced-sodium tomatosauce and tomato paste. Low-sodium or reduced-sodium canned vegetables. Grains Whole-grain or whole-wheat bread. Whole-grain or whole-wheat pasta. Brown rice. [place]. Bulgur. Whole-grain and low-sodium cereals. Pita bread.Low-fat, low-sodium crackers. Whole-wheat flour tortillas. Meats and other proteins Skinless chicken or [place]. Ground chicken or [place]. Pork with fat trimmed off. Fish and seafood. Egg whites. Dried beans, peas, or lentils. Unsalted nuts, nut butters, and seeds. Unsalted canned beans. Lean cuts of beef with fat trimmed off. Low-sodium, lean precooked or cured meat, such as sausages or meatloaves. Dairy Low-fat (1%) or fat-free (skim) milk. Reduced-fat, low-fat, or fat-free cheeses. Nonfat, low-sodium ricotta or cottage cheese. Low-fat or nonfatyogurt. Low-fat, low-sodium cheese. Fats and oils Soft margarine without trans fats. Vegetable oil. Reduced-fat, low-fat, or light mayonnaise and salad dressings (reduced-sodium). Canola, safflower, olive, avocado, soybean, andsunflower oils. Avocado. Seasonings and condiments Herbs. Spices. Seasoning mixes without salt. Other foods Unsalted popcorn and pretzels. Fat-free sweets. The items listed above may not be a complete list of foods and beverages you can eat. Contact a dietitian for more information. What foods should I avoid? Fruits Canned fruit in a light or heavy syrup. Fried fruit. Fruit in cream or buttersauce. Vegetables Creamed or fried vegetables. Vegetables in a cheese sauce. Regular canned vegetables (not low-sodium or reduced-sodium). Regular canned tomato sauce and paste (not low-sodium or reduced-sodium). Regular tomato and vegetable juice(not low-sodium or reduced-sodium). Angie Fava. Olives. Grains Baked goods made with fat, such as croissants, muffins, or some breads. Drypasta or rice meal packs. Meats and other proteins Fatty cuts of meat. Ribs. Fried meat. Berniece Salines. Bologna,  salami, and other precooked or cured meats, such as sausages or meat loaves. Fat from the back of a pig (fatback). Bratwurst. Salted nuts and seeds. Canned beans with added salt. Canned orsmoked fish. Whole eggs or egg yolks. Chicken or [place] with skin. Dairy Whole or 2% milk, cream, and half-and-half. Whole or full-fat cream cheese. Whole-fat or sweetened yogurt. Full-fat cheese. Nondairy creamers. Whippedtoppings. Processed cheese and cheese spreads. Fats and oils Butter. Stick margarine. Lard. Shortening. Ghee. Bacon fat. Tropical oils, suchas coconut, palm kernel, or palm oil. Seasonings and condiments Onion salt, garlic salt, seasoned salt, table salt, and sea salt. Worcestershire sauce. Tartar sauce. Barbecue sauce. Teriyaki sauce. Soy sauce, including reduced-sodium. Steak sauce. Canned and packaged gravies. Fish sauce. Oyster sauce. Cocktail sauce. Store-bought horseradish. Ketchup. Mustard. Meat flavorings and tenderizers. Bouillon cubes. Hot sauces. Pre-made or packaged marinades. Pre-made or packaged taco seasonings. Relishes. Regular saladdressings. Other foods Salted popcorn and pretzels. The items listed above may not be a complete list of foods and beverages you should avoid. Contact a dietitian for more information. Where to find more information National Heart, Lung, and Blood Institute: https://wilson-eaton.com/ American Heart Association: www.heart.org Academy of Nutrition and Dietetics: www.eatright.[place]: www.kidney.org Summary The DASH eating plan is a healthy eating plan that has been shown to reduce high blood pressure (hypertension). It may also reduce your risk for type 2 diabetes, heart disease, and stroke. When on the DASH eating plan, aim to eat more  fresh fruits and vegetables, whole grains, lean proteins, low-fat dairy, and heart-healthy fats. With the DASH eating plan, you should limit salt (sodium) intake to 2,300 mg a day. If you have  hypertension, you may need to reduce your sodium intake to 1,500 mg a day. Work with your health care provider or dietitian to adjust your eating plan to your individual calorie needs. This information is not intended to replace advice given to you by your health care provider. Make sure you discuss any questions you have with your healthcare provider. Document Revised: 03/04/2019 Document Reviewed: 03/04/2019 Elsevier Patient Education  2022 [employer].

## 2020-10-02 NOTE — Assessment & Plan Note (Signed)
Chronic, ongoing with recent FEV 1 = 85% in December 2021.  Continue Albuterol as needed and consider maintenance in future.  Spirometry yearly.  Recommend complete cessation of smoking.

## 2020-10-02 NOTE — Progress Notes (Signed)
BP 92/60   Pulse 70   Temp 98.1 F (36.7 C) (Oral)   Wt 101 lb 9.6 oz (46.1 kg)   SpO2 (!) 83%   BMI 19.84 kg/m    Subjective:    Patient ID: Lauren Nolan, female    DOB: 1946-04-13, 75 y.o.   MRN: [id number]  HPI: Lauren Nolan is a 75 y.o. female  Chief Complaint  Patient presents with   Hyperlipidemia   Hypertension   COPD   Mood   Osteoporosis   Injections    Patient states she received a call stating she was in need of her tetanus injection.    DEPRESSION/ANXIETY Continues on Effexor 75 MG.  Denies SI/HI. Mood status: stable Satisfied with current treatment?: yes Symptom severity: mild  Duration of current treatment : chronic Side effects: no Medication compliance: good compliance Psychotherapy/counseling: none Previous psychiatric medications: Effexor Depressed mood: no Anxious mood: occasionally Anhedonia: no Significant weight loss or gain: no Insomnia: none Fatigue: no Feelings of worthlessness or guilt: no Impaired concentration/indecisiveness: occasionally Suicidal ideations: no Hopelessness: no Crying spells: no GAD 7 : Generalized Anxiety Score 10/02/2020 09/16/2019 04/26/2018 02/15/2018  Nervous, Anxious, on Edge 3 1 1 1   Control/stop worrying 0 0 0 1  Worry too much - different things 0 0 0 0  Trouble relaxing 0 0 0 0  Restless 0 0 0 0  Easily annoyed or irritable 0 0 0 0  Afraid - awful might happen 0 0 0 0  Total GAD 7 Score 3 1 1 2   Anxiety Difficulty - Not difficult at all Not difficult at all -   HYPERTENSION Continues on Amlodipine and Olmesartan, ASA.  Continues to smoke, 1/2 PPD, not interested in quitting.  Has been smoking since she was age 28.  Recent LDL 53. Hypertension status: stable  Satisfied with current treatment? yes Duration of hypertension: chronic BP monitoring frequency:  rarely BP range: 120-130/70 BP medication side effects:  no Medication compliance: good compliance Aspirin: yes Recurrent headaches:  no Visual changes: no Palpitations: no Dyspnea: no Chest pain: no Lower extremity edema: no Dizzy/lightheaded: no The 10-year ASCVD risk score Mikey Bussing DC Jr., et al., 2013) is: 14.5%   Values used to calculate the score:     Age: 68 years     Sex: Female     Is Non-Hispanic African American: No     Diabetic: No     Tobacco smoker: Yes     Systolic Blood Pressure: 92 mmHg     Is BP treated: Yes     HDL Cholesterol: 98 mg/dL     Total Cholesterol: 165 mg/dL   OSTEOPOROSIS Last DEXA 07/17/20 -- T score -3.5 and is taking Fosamax.   Satisfied with current treatment?: yes Medication side effects: no Medication compliance: good compliance Past osteoporosis medications/treatments: none Adequate calcium & vitamin D: yes Intolerance to bisphosphonates:no Weight bearing exercises: yes  COPD Has Albuterol, which uses 3 mornings a week.  December FEV1 85%. COPD status: controlled Satisfied with current treatment?: yes Oxygen use: no Dyspnea frequency: none Cough frequency: none Rescue inhaler frequency:  3 times a week Limitation of activity: no Productive cough: none Last Spirometry: 03/19/20 Pneumovax: Up to Date Influenza: Up to Date    Relevant past medical, surgical, family and social history reviewed and updated as indicated. Interim medical history since our last visit reviewed. Allergies and medications reviewed and updated.  Review of Systems  Constitutional:  Negative for activity change, appetite change,  diaphoresis, fatigue and fever.  Respiratory:  Negative for cough, chest tightness and shortness of breath.   Cardiovascular:  Negative for chest pain, palpitations and leg swelling.  Gastrointestinal: Negative.   Endocrine: Negative for cold intolerance and heat intolerance.  Neurological: Negative.   Psychiatric/Behavioral: Negative.     Per HPI unless specifically indicated above     Objective:    BP 92/60   Pulse 70   Temp 98.1 F (36.7 C) (Oral)   Wt 101  lb 9.6 oz (46.1 kg)   SpO2 (!) 83%   BMI 19.84 kg/m   Wt Readings from Last 3 Encounters:  10/02/20 101 lb 9.6 oz (46.1 kg)  08/13/20 101 lb (45.8 kg)  06/22/20 105 lb 6.4 oz (47.8 kg)    Physical Exam Vitals and nursing note reviewed.  Constitutional:      General: She is awake. She is not in acute distress.    Appearance: She is well-developed and well-groomed. She is not ill-appearing.  HENT:     Head: Normocephalic.     Right Ear: Hearing normal.     Left Ear: Hearing normal.  Eyes:     General: Lids are normal.        Right eye: No discharge.        Left eye: No discharge.     Conjunctiva/sclera: Conjunctivae normal.     Pupils: Pupils are equal, round, and reactive to light.  Neck:     Thyroid: No thyromegaly.     Vascular: No carotid bruit.  Cardiovascular:     Rate and Rhythm: Normal rate and regular rhythm.     Heart sounds: Normal heart sounds. No murmur heard.   No gallop.  Pulmonary:     Effort: Pulmonary effort is normal. No accessory muscle usage or respiratory distress.     Breath sounds: Normal breath sounds.  Abdominal:     General: Bowel sounds are normal.     Palpations: Abdomen is soft.  Musculoskeletal:     Cervical back: Normal range of motion and neck supple.     Right lower leg: No edema.     Left lower leg: No edema.  Lymphadenopathy:     Cervical: No cervical adenopathy.  Skin:    General: Skin is warm and dry.  Neurological:     Mental Status: She is alert and oriented to person, place, and time.  Psychiatric:        Attention and Perception: Attention normal.        Mood and Affect: Mood normal.        Speech: Speech normal.        Behavior: Behavior normal. Behavior is cooperative.        Thought Content: Thought content normal.    Results for orders placed or performed in visit on 09/11/20  Novel Coronavirus, NAA ([facility])   Specimen: Nasopharyngeal(NP) swabs in vial transport medium  Result Value Ref Range   SARS-CoV-2, NAA Not  Detected Not Detected  SARS-COV-2, NAA 2 DAY TAT  Result Value Ref Range   SARS-CoV-2, NAA 2 DAY TAT Performed   Rocky mtn spotted fvr abs pnl(IgG+IgM)  Result Value Ref Range   RMSF IgG Negative Negative   RMSF IgM 2.24 (H) 0.00 - 0.89 index  Lyme Disease Serology w/Reflex  Result Value Ref Range   Lyme Total Antibody EIA Negative Negative      Assessment & Plan:   Problem List Items Addressed This Visit  Cardiovascular and Mediastinum   Hypertension    Chronic, ongoing.  BP below goal in office today and on home readings.  Reduce Amlodipine to 5 MG daily and continue Olmesartan 40 MG MG daily + ASA.  CMP today.  Would consider changing Effexor to an SSRI due to effect of Effexor on BP if elevations present.  She is to check BP daily at home and document.  Recommend complete cessation of smoking and focus on DASH diet.  Return to office in 4 weeks for BP check and will reduce further if needed.       Relevant Medications   amLODipine (NORVASC) 5 MG tablet   Other Relevant Orders   Comprehensive metabolic panel   Lipid Panel w/o Chol/HDL Ratio     Respiratory   COPD (chronic obstructive pulmonary disease) ([place]) - Primary    Chronic, ongoing with recent FEV 1 = 85% in December 2021.  Continue Albuterol as needed and consider maintenance in future.  Spirometry yearly.  Recommend complete cessation of smoking.         Musculoskeletal and Integument   Osteoporosis    Ongoing, continue Fosamax.  May consider period off Fosamax in future, at this time continue due to ongoing osteoporosis on imaging.  Vit D level annually.         Other   Generalized anxiety disorder    Chronic, stable.  Denies SI/HI.  Continue current regimen and adjust as needed.       Depression    Chronic, stable.  Denies SI/HI.  Continue Effexor 75 MG daily as is beneficial at this time, could consider change to SSRI if elevation in BP ever presents.  At this time return in 6 months for annual  physical, sooner if worsening mood.       Nicotine dependence, cigarettes, uncomplicated    I have recommended complete cessation of tobacco use. I have discussed various options available for assistance with tobacco cessation including over the counter methods (Nicotine gum, patch and lozenges). We also discussed prescription options (Chantix, Nicotine Inhaler / Nasal Spray). The patient is not interested in pursuing any prescription tobacco cessation options at this time.        Other Visit Diagnoses     Need for Tdap vaccination       Tdap today   Relevant Orders   Tdap vaccine greater than or equal to 7yo IM (Completed)        Follow up plan: Return in about 4 weeks (around 10/30/2020) for BP check -- recent Amlodipine reduction .

## 2020-10-02 NOTE — Assessment & Plan Note (Signed)
Chronic, stable.  Denies SI/HI.  Continue current regimen and adjust as needed. 

## 2020-10-02 NOTE — Assessment & Plan Note (Signed)
Chronic, ongoing.  BP below goal in office today and on home readings.  Reduce Amlodipine to 5 MG daily and continue Olmesartan 40 MG MG daily + ASA.  CMP today.  Would consider changing Effexor to an SSRI due to effect of Effexor on BP if elevations present.  She is to check BP daily at home and document.  Recommend complete cessation of smoking and focus on DASH diet.  Return to office in 4 weeks for BP check and will reduce further if needed.

## 2020-10-03 ENCOUNTER — Other Ambulatory Visit: Payer: Self-pay | Admitting: Nurse Practitioner

## 2020-10-03 DIAGNOSIS — E875 Hyperkalemia: Secondary | ICD-10-CM

## 2020-10-03 DIAGNOSIS — E871 Hypo-osmolality and hyponatremia: Secondary | ICD-10-CM

## 2020-10-03 LAB — COMPREHENSIVE METABOLIC PANEL
ALT: 16 IU/L (ref 0–32)
AST: 18 IU/L (ref 0–40)
Albumin/Globulin Ratio: 1.8 (ref 1.2–2.2)
Albumin: 4.6 g/dL (ref 3.7–4.7)
Alkaline Phosphatase: 61 IU/L (ref 44–121)
BUN/Creatinine Ratio: 14 (ref 12–28)
BUN: 12 mg/dL (ref 8–27)
Bilirubin Total: 0.5 mg/dL (ref 0.0–1.2)
CO2: 22 mmol/L (ref 20–29)
Calcium: 9.3 mg/dL (ref 8.7–10.3)
Chloride: 95 mmol/L — ABNORMAL LOW (ref 96–106)
Creatinine, Ser: 0.84 mg/dL (ref 0.57–1.00)
Globulin, Total: 2.6 g/dL (ref 1.5–4.5)
Glucose: 75 mg/dL (ref 65–99)
Potassium: 5.8 mmol/L — ABNORMAL HIGH (ref 3.5–5.2)
Sodium: 133 mmol/L — ABNORMAL LOW (ref 134–144)
Total Protein: 7.2 g/dL (ref 6.0–8.5)
eGFR: 73 mL/min/{1.73_m2} (ref >59)

## 2020-10-03 LAB — LIPID PANEL W/O CHOL/HDL RATIO
Cholesterol, Total: 158 mg/dL (ref 100–199)
HDL: 86 mg/dL (ref >39)
LDL Chol Calc (NIH): 57 mg/dL (ref 0–99)
Triglycerides: 83 mg/dL (ref 0–149)
VLDL Cholesterol Cal: 15 mg/dL (ref 5–40)

## 2020-10-03 MED ORDER — OLMESARTAN MEDOXOMIL 20 MG PO TABS: 20.0000 mg | ORAL_TABLET | Freq: Every day | ORAL | 4 refills | Status: DC

## 2020-10-03 NOTE — Progress Notes (Signed)
Good morning, please let Lauren Nolan know her labs have returned.  Cholesterol levels are at goal.  Her potassium was elevated at 5.8, due to her blood pressure being on lower side and the fact Olmesartan can increase potassium, I would like her to start taking 1/2 tablet of this (20 MG) and have sent in 20 MG dosing in case she can not 1/2 the tablet.  Her sodium, salt, was low as well.  I would like her to add just a little salt to diet daily and we will recheck.  I would like her to return in 2 weeks to recheck these levels.  Please schedule lab only visit.  Any questions? Keep being awesome!!  Thank you for allowing me to participate in your care.  I appreciate you. Kindest regards, Hurschel Paynter

## 2020-10-16 ENCOUNTER — Other Ambulatory Visit: Payer: Self-pay | Admitting: Nurse Practitioner

## 2020-10-16 DIAGNOSIS — M810 Age-related osteoporosis without current pathological fracture: Secondary | ICD-10-CM

## 2020-10-18 ENCOUNTER — Other Ambulatory Visit: Payer: Self-pay

## 2020-10-19 ENCOUNTER — Other Ambulatory Visit: Payer: Medicare Other

## 2020-10-19 DIAGNOSIS — E875 Hyperkalemia: Secondary | ICD-10-CM

## 2020-10-19 DIAGNOSIS — E871 Hypo-osmolality and hyponatremia: Secondary | ICD-10-CM

## 2020-10-20 ENCOUNTER — Encounter: Payer: Self-pay | Admitting: Nurse Practitioner

## 2020-10-20 DIAGNOSIS — E871 Hypo-osmolality and hyponatremia: Secondary | ICD-10-CM | POA: Insufficient documentation

## 2020-10-20 LAB — BASIC METABOLIC PANEL
BUN/Creatinine Ratio: 14 (ref 12–28)
BUN: 10 mg/dL (ref 8–27)
CO2: 22 mmol/L (ref 20–29)
Calcium: 9 mg/dL (ref 8.7–10.3)
Chloride: 96 mmol/L (ref 96–106)
Creatinine, Ser: 0.73 mg/dL (ref 0.57–1.00)
Glucose: 152 mg/dL — ABNORMAL HIGH (ref 65–99)
Potassium: 4.9 mmol/L (ref 3.5–5.2)
Sodium: 132 mmol/L — ABNORMAL LOW (ref 134–144)
eGFR: 86 mL/min/{1.73_m2} (ref >59)

## 2020-10-20 NOTE — Progress Notes (Signed)
Good morning, please let Kip know her labs have returned.  Potassium has now trended down to normal range, but sodium remains on low side.  Please try to add a little table salt to diet daily and we will recheck next visit.  Her glucose was also high, sugar level, but this may be from eating prior to lab visit as prior sugar levels have been perfect and normal.  Any questions? Keep being awesome!!  Thank you for allowing me to participate in your care.  I appreciate you. Kindest regards, Shyna Duignan

## 2020-10-30 ENCOUNTER — Other Ambulatory Visit: Payer: Self-pay

## 2020-10-30 ENCOUNTER — Ambulatory Visit (INDEPENDENT_AMBULATORY_CARE_PROVIDER_SITE_OTHER): Payer: Medicare Other | Admitting: Nurse Practitioner

## 2020-10-30 ENCOUNTER — Encounter: Payer: Self-pay | Admitting: Nurse Practitioner

## 2020-10-30 VITALS — BP 102/66 | HR 77 | Temp 98.5°F | Wt 101.8 lb

## 2020-10-30 DIAGNOSIS — E871 Hypo-osmolality and hyponatremia: Secondary | ICD-10-CM

## 2020-10-30 DIAGNOSIS — I10 Essential (primary) hypertension: Secondary | ICD-10-CM

## 2020-10-30 DIAGNOSIS — R7309 Other abnormal glucose: Secondary | ICD-10-CM

## 2020-10-30 NOTE — Assessment & Plan Note (Signed)
Recheck NA + today, has been adding salt to diet daily.

## 2020-10-30 NOTE — Assessment & Plan Note (Signed)
Chronic, ongoing.  BP below goal in office today with recent reductions.  Trial discontinuation of Amlodipine at this time and continue Olmesartan 20 MG daily + ASA.  BMP today.  Would consider changing Effexor to an SSRI due to effect of Effexor on BP if elevations present.  She is to check BP daily at home and document.  Recommend complete cessation of smoking and focus on DASH diet.  Return to office in 6 months, she is to alert provider if any elevations >130/80 on BP at home and be seen sooner if present.

## 2020-10-30 NOTE — Progress Notes (Signed)
BP 102/66   Pulse 77   Temp 98.5 F (36.9 C) (Oral)   Wt 101 lb 12.8 oz (46.2 kg)   SpO2 96%   BMI 19.88 kg/m    Subjective:    Patient ID: Lauren Nolan, female    DOB: 1946-02-04, 75 y.o.   MRN: [id number]  HPI: Lauren Nolan is a 75 y.o. female  Chief Complaint  Patient presents with   Blood Pressure Recheck   HYPERTENSION Return to check blood pressure with recent reductions in medication -- Olmesartan reduced to 20 MG and Amlodipine to 5 MG.  K+ had been elevated at 5.8 and with reduction Olmesartan repeat 4.9. Recent NA+ 132, was 133 and glucose 152 -- had ate before visit.  She reports feeling better with reduction in medications, no dizziness. Hypertension status: controlled  Satisfied with current treatment? yes Duration of hypertension: chronic BP monitoring frequency:  a few times a month BP range:  BP medication side effects:  no Medication compliance: good compliance Aspirin: yes Recurrent headaches: no Visual changes: no Palpitations: no Dyspnea: no Chest pain: no Lower extremity edema: no Dizzy/lightheaded: no   Relevant past medical, surgical, family and social history reviewed and updated as indicated. Interim medical history since our last visit reviewed. Allergies and medications reviewed and updated.  Review of Systems  Constitutional:  Negative for activity change, appetite change, diaphoresis, fatigue and fever.  Respiratory:  Negative for cough, chest tightness and shortness of breath.   Cardiovascular:  Negative for chest pain, palpitations and leg swelling.  Gastrointestinal: Negative.   Endocrine: Negative for cold intolerance and heat intolerance.  Neurological: Negative.   Psychiatric/Behavioral: Negative.     Per HPI unless specifically indicated above     Objective:    BP 102/66   Pulse 77   Temp 98.5 F (36.9 C) (Oral)   Wt 101 lb 12.8 oz (46.2 kg)   SpO2 96%   BMI 19.88 kg/m   Wt Readings from Last 3 Encounters:   10/30/20 101 lb 12.8 oz (46.2 kg)  10/02/20 101 lb 9.6 oz (46.1 kg)  08/13/20 101 lb (45.8 kg)    Physical Exam Vitals and nursing note reviewed.  Constitutional:      General: She is awake. She is not in acute distress.    Appearance: She is well-developed and well-groomed. She is not ill-appearing.  HENT:     Head: Normocephalic.     Right Ear: Hearing normal.     Left Ear: Hearing normal.  Eyes:     General: Lids are normal.        Right eye: No discharge.        Left eye: No discharge.     Conjunctiva/sclera: Conjunctivae normal.     Pupils: Pupils are equal, round, and reactive to light.  Neck:     Thyroid: No thyromegaly.     Vascular: No carotid bruit.  Cardiovascular:     Rate and Rhythm: Normal rate and regular rhythm.     Heart sounds: Normal heart sounds. No murmur heard.   No gallop.  Pulmonary:     Effort: Pulmonary effort is normal. No accessory muscle usage or respiratory distress.     Breath sounds: Normal breath sounds.  Abdominal:     General: Bowel sounds are normal.     Palpations: Abdomen is soft.  Musculoskeletal:     Cervical back: Normal range of motion and neck supple.     Right lower leg: No edema.  Left lower leg: No edema.  Lymphadenopathy:     Cervical: No cervical adenopathy.  Skin:    General: Skin is warm and dry.  Neurological:     Mental Status: She is alert and oriented to person, place, and time.  Psychiatric:        Attention and Perception: Attention normal.        Mood and Affect: Mood normal.        Speech: Speech normal.        Behavior: Behavior normal. Behavior is cooperative.        Thought Content: Thought content normal.   Results for orders placed or performed in visit on 00/71/21  Basic metabolic panel  Result Value Ref Range   Glucose 152 (H) 65 - 99 mg/dL   BUN 10 8 - 27 mg/dL   Creatinine, Ser 0.73 0.57 - 1.00 mg/dL   eGFR 86 >59 mL/min/1.73   BUN/Creatinine Ratio 14 12 - 28   Sodium 132 (L) 134 - 144  mmol/L   Potassium 4.9 3.5 - 5.2 mmol/L   Chloride 96 96 - 106 mmol/L   CO2 22 20 - 29 mmol/L   Calcium 9.0 8.7 - 10.3 mg/dL      Assessment & Plan:   Problem List Items Addressed This Visit       Cardiovascular and Mediastinum   Hypertension - Primary    Chronic, ongoing.  BP below goal in office today with recent reductions.  Trial discontinuation of Amlodipine at this time and continue Olmesartan 20 MG daily + ASA.  BMP today.  Would consider changing Effexor to an SSRI due to effect of Effexor on BP if elevations present.  She is to check BP daily at home and document.  Recommend complete cessation of smoking and focus on DASH diet.  Return to office in 6 months, she is to alert provider if any elevations >130/80 on BP at home and be seen sooner if present.         Other   Hyponatremia    Recheck NA + today, has been adding salt to diet daily.       Relevant Orders   Basic metabolic panel   Other Visit Diagnoses     Elevated glucose       Check A1c today due to recent elevation glucose on labs.   Relevant Orders   HgB A1c        Follow up plan: Return in about 5 months (around 03/20/2021) for Due for annual physical.

## 2020-10-30 NOTE — Patient Instructions (Signed)
STOP TAKING AMLODIPINE FOR NOW AND IF BLOOD PRESSURE AT HOME GREATER THEN 130/80 EVERY DAY RESTART THIS. [email or web address]">  DASH Eating Plan DASH stands for Dietary Approaches to Stop Hypertension. The DASH eating plan is a healthy eating plan that has been shown to: Reduce high blood pressure (hypertension). Reduce your risk for type 2 diabetes, heart disease, and stroke. Help with weight loss. What are tips for following this plan? Reading food labels Check food labels for the amount of salt (sodium) per serving. Choose foods with less than 5 percent of the Daily Value of sodium. Generally, foods with less than 300 milligrams (mg) of sodium per serving fit into this eating plan. To find whole grains, look for the word "whole" as the first word in the ingredient list. Shopping Buy products labeled as "low-sodium" or "no salt added." Buy fresh foods. Avoid canned foods and pre-made or frozen meals. Cooking Avoid adding salt when cooking. Use salt-free seasonings or herbs instead of table salt or sea salt. Check with your health care provider or pharmacist before using salt substitutes. Do not fry foods. Cook foods using healthy methods such as baking, boiling, grilling, roasting, and broiling instead. Cook with heart-healthy oils, such as olive, canola, avocado, soybean, or sunflower oil. Meal planning  Eat a balanced diet that includes: 4 or more servings of fruits and 4 or more servings of vegetables each day. Try to fill one-half of your plate with fruits and vegetables. 6-8 servings of whole grains each day. Less than 6 oz (170 g) of lean meat, poultry, or fish each day. A 3-oz (85-g) serving of meat is about the same size as a deck of cards. One egg equals 1 oz (28 g). 2-3 servings of low-fat dairy each day. One serving is 1 cup (237 mL). 1 serving of nuts, seeds, or beans 5 times each week. 2-3 servings of heart-healthy fats. Healthy  fats called omega-3 fatty acids are found in foods such as walnuts, flaxseeds, fortified milks, and eggs. These fats are also found in cold-water fish, such as sardines, salmon, and mackerel. Limit how much you eat of: Canned or prepackaged foods. Food that is high in trans fat, such as some fried foods. Food that is high in saturated fat, such as fatty meat. Desserts and other sweets, sugary drinks, and other foods with added sugar. Full-fat dairy products. Do not salt foods before eating. Do not eat more than 4 egg yolks a week. Try to eat at least 2 vegetarian meals a week. Eat more home-cooked food and less restaurant, buffet, and fast food.  Lifestyle When eating at a restaurant, ask that your food be prepared with less salt or no salt, if possible. If you drink alcohol: Limit how much you use to: 0-1 drink a day for women who are not pregnant. 0-2 drinks a day for men. Be aware of how much alcohol is in your drink. In the [place] one drink equals one 12 oz bottle of beer (355 mL), one 5 oz glass of wine (148 mL), or one 1 oz glass of hard liquor (44 mL). General information Avoid eating more than 2,300 mg of salt a day. If you have hypertension, you may need to reduce your sodium intake to 1,500 mg a day. Work with your health care provider to maintain a healthy body weight or to lose weight. Ask what an ideal weight is for you. Get at least 30 minutes of exercise that causes your heart to beat faster (aerobic exercise)  most days of the week. Activities may include walking, swimming, or biking. Work with your health care provider or dietitian to adjust your eating plan to your individual calorie needs. What foods should I eat? Fruits All fresh, dried, or frozen fruit. Canned fruit in natural juice (without addedsugar). Vegetables Fresh or frozen vegetables (raw, steamed, roasted, or grilled). Low-sodium or reduced-sodium tomato and vegetable juice. Low-sodium or reduced-sodium  tomatosauce and tomato paste. Low-sodium or reduced-sodium canned vegetables. Grains Whole-grain or whole-wheat bread. Whole-grain or whole-wheat pasta. Brown rice. [place]. Bulgur. Whole-grain and low-sodium cereals. Pita bread.Low-fat, low-sodium crackers. Whole-wheat flour tortillas. Meats and other proteins Skinless chicken or [place]. Ground chicken or [place]. Pork with fat trimmed off. Fish and seafood. Egg whites. Dried beans, peas, or lentils. Unsalted nuts, nut butters, and seeds. Unsalted canned beans. Lean cuts of beef with fat trimmed off. Low-sodium, lean precooked or cured meat, such as sausages or meatloaves. Dairy Low-fat (1%) or fat-free (skim) milk. Reduced-fat, low-fat, or fat-free cheeses. Nonfat, low-sodium ricotta or cottage cheese. Low-fat or nonfatyogurt. Low-fat, low-sodium cheese. Fats and oils Soft margarine without trans fats. Vegetable oil. Reduced-fat, low-fat, or light mayonnaise and salad dressings (reduced-sodium). Canola, safflower, olive, avocado, soybean, andsunflower oils. Avocado. Seasonings and condiments Herbs. Spices. Seasoning mixes without salt. Other foods Unsalted popcorn and pretzels. Fat-free sweets. The items listed above may not be a complete list of foods and beverages you can eat. Contact a dietitian for more information. What foods should I avoid? Fruits Canned fruit in a light or heavy syrup. Fried fruit. Fruit in cream or buttersauce. Vegetables Creamed or fried vegetables. Vegetables in a cheese sauce. Regular canned vegetables (not low-sodium or reduced-sodium). Regular canned tomato sauce and paste (not low-sodium or reduced-sodium). Regular tomato and vegetable juice(not low-sodium or reduced-sodium). Angie Fava. Olives. Grains Baked goods made with fat, such as croissants, muffins, or some breads. Drypasta or rice meal packs. Meats and other proteins Fatty cuts of meat. Ribs. Fried meat. Berniece Salines. Bologna, salami, and other precooked  or cured meats, such as sausages or meat loaves. Fat from the back of a pig (fatback). Bratwurst. Salted nuts and seeds. Canned beans with added salt. Canned orsmoked fish. Whole eggs or egg yolks. Chicken or [place] with skin. Dairy Whole or 2% milk, cream, and half-and-half. Whole or full-fat cream cheese. Whole-fat or sweetened yogurt. Full-fat cheese. Nondairy creamers. Whippedtoppings. Processed cheese and cheese spreads. Fats and oils Butter. Stick margarine. Lard. Shortening. Ghee. Bacon fat. Tropical oils, suchas coconut, palm kernel, or palm oil. Seasonings and condiments Onion salt, garlic salt, seasoned salt, table salt, and sea salt. Worcestershire sauce. Tartar sauce. Barbecue sauce. Teriyaki sauce. Soy sauce, including reduced-sodium. Steak sauce. Canned and packaged gravies. Fish sauce. Oyster sauce. Cocktail sauce. Store-bought horseradish. Ketchup. Mustard. Meat flavorings and tenderizers. Bouillon cubes. Hot sauces. Pre-made or packaged marinades. Pre-made or packaged taco seasonings. Relishes. Regular saladdressings. Other foods Salted popcorn and pretzels. The items listed above may not be a complete list of foods and beverages you should avoid. Contact a dietitian for more information. Where to find more information National Heart, Lung, and Blood Institute: https://wilson-eaton.com/ American Heart Association: www.heart.org Academy of Nutrition and Dietetics: www.eatright.[place]: www.kidney.org Summary The DASH eating plan is a healthy eating plan that has been shown to reduce high blood pressure (hypertension). It may also reduce your risk for type 2 diabetes, heart disease, and stroke. When on the DASH eating plan, aim to eat more fresh fruits and vegetables, whole grains, lean proteins,  low-fat dairy, and heart-healthy fats. With the DASH eating plan, you should limit salt (sodium) intake to 2,300 mg a day. If you have hypertension, you may need to reduce  your sodium intake to 1,500 mg a day. Work with your health care provider or dietitian to adjust your eating plan to your individual calorie needs. This information is not intended to replace advice given to you by your health care provider. Make sure you discuss any questions you have with your healthcare provider. Document Revised: 03/04/2019 Document Reviewed: 03/04/2019 Elsevier Patient Education  2022 [employer].

## 2020-10-31 LAB — BASIC METABOLIC PANEL
BUN/Creatinine Ratio: 15 (ref 12–28)
BUN: 11 mg/dL (ref 8–27)
CO2: 23 mmol/L (ref 20–29)
Calcium: 9.6 mg/dL (ref 8.7–10.3)
Chloride: 93 mmol/L — ABNORMAL LOW (ref 96–106)
Creatinine, Ser: 0.74 mg/dL (ref 0.57–1.00)
Glucose: 82 mg/dL (ref 65–99)
Potassium: 4.9 mmol/L (ref 3.5–5.2)
Sodium: 132 mmol/L — ABNORMAL LOW (ref 134–144)
eGFR: 85 mL/min/{1.73_m2} (ref >59)

## 2020-10-31 LAB — HEMOGLOBIN A1C
Est. average glucose Bld gHb Est-mCnc: 108 mg/dL
Hgb A1c MFr Bld: 5.4 % (ref 4.8–5.6)

## 2020-10-31 NOTE — Progress Notes (Signed)
Good morning, please let Lauren Nolan know her labs have returned.  Salt level remains a little on low side at 132 (same as previous), has not gotten lower.  I would recommend continuing to add salt to diet daily and we will recheck next visit.  May further adjust medications if low.  A1c shows no diabetes.  Any questions? Keep being awesome!!  Thank you for allowing me to participate in your care.  I appreciate you. Kindest regards, Trevan Messman

## 2021-01-22 ENCOUNTER — Encounter: Payer: Self-pay | Admitting: Nurse Practitioner

## 2021-01-22 ENCOUNTER — Ambulatory Visit (INDEPENDENT_AMBULATORY_CARE_PROVIDER_SITE_OTHER): Payer: Medicare Other | Admitting: Nurse Practitioner

## 2021-01-22 VITALS — BP 119/79 | HR 84 | Temp 99.0°F

## 2021-01-22 DIAGNOSIS — R509 Fever, unspecified: Secondary | ICD-10-CM | POA: Insufficient documentation

## 2021-01-22 MED ORDER — PREDNISONE 20 MG PO TABS: 40.0000 mg | ORAL_TABLET | Freq: Every day | ORAL | 0 refills | Status: AC

## 2021-01-22 MED ORDER — AMOXICILLIN-POT CLAVULANATE 875-125 MG PO TABS: 1.0000 | ORAL_TABLET | Freq: Two times a day (BID) | ORAL | 0 refills | Status: AC

## 2021-01-22 NOTE — Assessment & Plan Note (Signed)
Acute x 4-5 days with sinus and fever issues.  Reports negative testing for Covid at home.  Is vaccinated.  Does have underlying lung disease.  At this time will plan on obtaining Covid and flu testing in office tomorrow.  Send in scripts for Augmentin daily and Prednisone x 5 days.  Recommend: - Increased rest - Increasing Fluids - Acetaminophen as needed for fever/pain.  - Salt water gargling, chloraseptic spray and throat lozenges - Coricidin  - Saline sinus flushes or a neti pot.  Return to office as needed for worsening symptoms.

## 2021-01-22 NOTE — Progress Notes (Signed)
BP 119/79   Pulse 84   Temp 99 F (37.2 C) (Oral)    Subjective:    Patient ID: Lauren Nolan, female    DOB: [birth year], 75 y.o.   MRN: [id number]  HPI: Lauren Nolan is a 75 y.o. female  Chief Complaint  Patient presents with   Cough    Patient states she is on day 4 of having symptoms. Patient states with sinus pressure in her head and sore throat. Patient states her coughing and thinks that it may have traveled down to her chest. Patient denies taking any OTC medications. Patient states she has had a low grade fever and states her husband was sick recently and he has completed his Amoxicillin treatment. Patient states she is coughing up yellow mucus. Patient denies been able to take an at-home COVID test as she has ran out of test. P   Sore Throat   Nasal Congestion   Fever   Headache   This visit was completed via telephone due to the restrictions of the COVID-19 pandemic. All issues as above were discussed and addressed but no physical exam was performed. If it was felt that the patient should be evaluated in the office, they were directed there. The patient verbally consented to this visit. Patient was unable to complete an audio/visual visit due to Lack of equipment. Due to the catastrophic nature of the COVID-19 pandemic, this visit was done through audio contact only. Location of the patient: home Location of the provider: work Those involved with this call:  Provider: Marnee Guarneri, DNP CMA: Irena Reichmann, [place] Desk/Registration: Leota Jacobsen  Time spent on call:  21 minutes on the phone discussing health concerns. 15 minutes total spent in review of patient's record and preparation of their chart.  I verified patient identity using two factors (patient name and date of birth). Patient consents verbally to being seen via telemedicine visit today.    UPPER RESPIRATORY TRACT INFECTION Symptoms started 4-5 days ago, nose started running and head got stopped up,  then next day had a sore throat.  Now is settled down in chest, coughing up "yellow goop".  Has had 3 Covid vaccines.  Her husband was sick two weeks ago -- negative Covid.  Patient reports negative Covid test at home.  Has underlying lung disease. Fever: yes Cough: yes Shortness of breath: no Wheezing: yes Chest pain: no Chest tightness: no Chest congestion: yes Nasal congestion: yes Runny nose: yes Post nasal drip: yes Sneezing: no Sore throat: yes Swollen glands: no Sinus pressure: yes Headache: yes Face pain: no Toothache: no Ear pain: none Ear pressure: none Eyes red/itching:no Eye drainage/crusting: no  Vomiting: no Rash: no Fatigue: yes Sick contacts: yes Strep contacts: no  Context: stable Recurrent sinusitis: no Relief with OTC cold/cough medications: yes  Treatments attempted:  Ibuprofen and Tumeric     Relevant past medical, surgical, family and social history reviewed and updated as indicated. Interim medical history since our last visit reviewed. Allergies and medications reviewed and updated.  Review of Systems  Constitutional:  Positive for fatigue and fever. Negative for activity change and appetite change.  HENT:  Positive for congestion, postnasal drip, rhinorrhea, sinus pressure and sore throat. Negative for ear discharge, ear pain, facial swelling, sinus pain, sneezing and voice change.   Eyes:  Negative for pain and visual disturbance.  Respiratory:  Positive for cough, chest tightness and wheezing. Negative for shortness of breath.   Cardiovascular:  Negative for chest  pain, palpitations and leg swelling.  Gastrointestinal: Negative.   Endocrine: Negative.   Musculoskeletal:  Positive for myalgias.  Neurological:  Positive for headaches. Negative for dizziness and numbness.  Psychiatric/Behavioral: Negative.     Per HPI unless specifically indicated above     Objective:    BP 119/79   Pulse 84   Temp 99 F (37.2 C) (Oral)   Wt Readings  from Last 3 Encounters:  10/30/20 101 lb 12.8 oz (46.2 kg)  10/02/20 101 lb 9.6 oz (46.1 kg)  08/13/20 101 lb (45.8 kg)    Physical Exam  Unable to perform due to telephone visit only.  Results for orders placed or performed in visit on 10/30/20  Basic metabolic panel  Result Value Ref Range   Glucose 82 65 - 99 mg/dL   BUN 11 8 - 27 mg/dL   Creatinine, Ser 0.74 0.57 - 1.00 mg/dL   eGFR 85 >59 mL/min/1.73   BUN/Creatinine Ratio 15 12 - 28   Sodium 132 (L) 134 - 144 mmol/L   Potassium 4.9 3.5 - 5.2 mmol/L   Chloride 93 (L) 96 - 106 mmol/L   CO2 23 20 - 29 mmol/L   Calcium 9.6 8.7 - 10.3 mg/dL  HgB A1c  Result Value Ref Range   Hgb A1c MFr Bld 5.4 4.8 - 5.6 %   Est. average glucose Bld gHb Est-mCnc 108 mg/dL      Assessment & Plan:   Problem List Items Addressed This Visit       Other   Fever - Primary    Acute x 4-5 days with sinus and fever issues.  Reports negative testing for Covid at home.  Is vaccinated.  Does have underlying lung disease.  At this time will plan on obtaining Covid and flu testing in office tomorrow.  Send in scripts for Augmentin daily and Prednisone x 5 days.  Recommend: - Increased rest - Increasing Fluids - Acetaminophen as needed for fever/pain.  - Salt water gargling, chloraseptic spray and throat lozenges - Coricidin  - Saline sinus flushes or a neti pot.  Return to office as needed for worsening symptoms.      Relevant Orders   Veritor Flu A/B Waived   Novel Coronavirus, NAA ([facility])    I discussed the assessment and treatment plan with the patient. The patient was provided an opportunity to ask questions and all were answered. The patient agreed with the plan and demonstrated an understanding of the instructions.   The patient was advised to call back or seek an in-person evaluation if the symptoms worsen or if the condition fails to improve as anticipated.   I provided 21+ minutes of time during this encounter.   Follow up  plan: Return if symptoms worsen or fail to improve.       

## 2021-01-22 NOTE — Patient Instructions (Signed)

## 2021-01-23 DIAGNOSIS — R509 Fever, unspecified: Secondary | ICD-10-CM | POA: Diagnosis not present

## 2021-01-23 LAB — VERITOR FLU A/B WAIVED
Influenza A: NEGATIVE
Influenza B: NEGATIVE

## 2021-01-23 NOTE — Progress Notes (Signed)
Noted, thank you

## 2021-01-23 NOTE — Progress Notes (Signed)
Please let Gwinda Passe know flu testing is negative and we are waiting on Covid testing.  Continue current treatment.  Have a good day!!

## 2021-01-23 NOTE — Addendum Note (Signed)
Addended byRudie Meyer on: 01/23/2021 11:25 AM   Modules accepted: Orders

## 2021-01-25 LAB — NOVEL CORONAVIRUS, NAA: SARS-CoV-2, NAA: NOT DETECTED

## 2021-01-25 LAB — SARS-COV-2, NAA 2 DAY TAT

## 2021-01-25 NOTE — Progress Notes (Signed)
Please let Alencia know her Covid was negative:)

## 2021-01-30 ENCOUNTER — Ambulatory Visit (INDEPENDENT_AMBULATORY_CARE_PROVIDER_SITE_OTHER): Payer: Medicare Other

## 2021-01-30 ENCOUNTER — Other Ambulatory Visit: Payer: Self-pay

## 2021-01-30 ENCOUNTER — Other Ambulatory Visit: Payer: Self-pay | Admitting: Nurse Practitioner

## 2021-01-30 DIAGNOSIS — F329 Major depressive disorder, single episode, unspecified: Secondary | ICD-10-CM

## 2021-01-30 DIAGNOSIS — Z23 Encounter for immunization: Secondary | ICD-10-CM | POA: Diagnosis not present

## 2021-01-30 NOTE — Telephone Encounter (Signed)
Future OV 03/21/21  Approved per protocol.  Requested Prescriptions  Pending Prescriptions Disp Refills  . venlafaxine XR (EFFEXOR-XR) 75 MG 24 hr capsule [Pharmacy Med Name: VENLAFAXINE HCL ER 75 MG CAP] 90 capsule 0    Sig: Take 1 capsule (75 mg total) by mouth daily with breakfast.     Psychiatry: Antidepressants - SNRI - desvenlafaxine & venlafaxine Passed - 01/30/2021 10:06 AM      Passed - LDL in normal range and within 360 days    LDL Chol Calc (NIH)  Date Value Ref Range Status  10/02/2020 57 0 - 99 mg/dL Final         Passed - Total Cholesterol in normal range and within 360 days    Cholesterol, Total  Date Value Ref Range Status  10/02/2020 158 100 - 199 mg/dL Final   Cholesterol Piccolo, Waived  Date Value Ref Range Status  05/07/2015 176 <200 mg/dL Final    Comment:                            Desirable                <200                         Borderline High      200- 239                         High                     >239          Passed - Triglycerides in normal range and within 360 days    Triglycerides  Date Value Ref Range Status  10/02/2020 83 0 - 149 mg/dL Final   Triglycerides Piccolo,Waived  Date Value Ref Range Status  05/07/2015 119 <150 mg/dL Final    Comment:                            Normal                   <150                         Borderline High     150 - 199                         High                200 - 499                         Very High                >499          Passed - Completed PHQ-2 or PHQ-9 in the last 360 days      Passed - Last BP in normal range    BP Readings from Last 1 Encounters:  01/22/21 119/79         Passed - Valid encounter within last 6 months    Recent Outpatient Visits          1 week ago Fever, unspecified fever cause   [facility] [place], Barbaraann Faster, NP  3 months ago Primary hypertension   [place], [place]ia T, NP   4 months ago Simple chronic  bronchitis ([place])   [place], [place] T, NP   4 months ago Upper respiratory tract infection, unspecified type   [place], Lauren A, NP   7 months ago Pelvic muscle atrophy   [place], Barbaraann Faster, NP      Future Appointments            In 1 month [place], Barbaraann Faster, NP [facility]   In 6 months  [facility]

## 2021-03-04 ENCOUNTER — Ambulatory Visit
Admission: RE | Admit: 2021-03-04 | Discharge: 2021-03-04 | Disposition: A | Discharge status: Home / Self Care | Payer: Medicare Other | Attending: Nurse Practitioner | Admitting: Nurse Practitioner

## 2021-03-04 ENCOUNTER — Other Ambulatory Visit: Payer: Self-pay

## 2021-03-04 ENCOUNTER — Ambulatory Visit
Admission: RE | Admit: 2021-03-04 | Discharge: 2021-03-04 | Disposition: A | Discharge status: Home / Self Care | Payer: Medicare Other | Source: Ambulatory Visit | Attending: Nurse Practitioner | Admitting: Nurse Practitioner

## 2021-03-04 ENCOUNTER — Ambulatory Visit (INDEPENDENT_AMBULATORY_CARE_PROVIDER_SITE_OTHER): Payer: Medicare Other | Admitting: Nurse Practitioner

## 2021-03-04 ENCOUNTER — Encounter: Payer: Self-pay | Admitting: Nurse Practitioner

## 2021-03-04 VITALS — BP 147/84 | HR 97 | Temp 98.4°F | Resp 20 | Wt 101.8 lb

## 2021-03-04 DIAGNOSIS — M810 Age-related osteoporosis without current pathological fracture: Secondary | ICD-10-CM | POA: Insufficient documentation

## 2021-03-04 DIAGNOSIS — M25512 Pain in left shoulder: Secondary | ICD-10-CM | POA: Insufficient documentation

## 2021-03-04 DIAGNOSIS — W19XXXA Unspecified fall, initial encounter: Secondary | ICD-10-CM

## 2021-03-04 DIAGNOSIS — I10 Essential (primary) hypertension: Secondary | ICD-10-CM

## 2021-03-04 DIAGNOSIS — W06XXXA Fall from bed, initial encounter: Secondary | ICD-10-CM | POA: Diagnosis not present

## 2021-03-04 MED ORDER — TRAMADOL HCL 50 MG PO TABS: 50.0000 mg | ORAL_TABLET | Freq: Three times a day (TID) | ORAL | 0 refills | Status: AC | PRN

## 2021-03-04 NOTE — Assessment & Plan Note (Signed)
Blood pressure elevated today, however in acute pain from fall. Continue to monitor blood pressure at home and follow up at next visit.

## 2021-03-04 NOTE — Progress Notes (Signed)
Acute Office Visit  Subjective:    Patient ID: Lauren Nolan, female    DOB: 1945/11/16, 75 y.o.   MRN: [id number]  Chief Complaint  Patient presents with   Fall   Shoulder Pain    HPI Patient is in today for left shoulder pain after a fall this morning. She was outside working and stepped in some mud and fell. She denies dizziness, light-headedness, chest pain before fall  SHOULDER PAIN  Duration: days this morning Involved shoulder: left Mechanism of injury: trauma - fall Location: anterior Onset:sudden Severity: 9/10  Quality:  "pain" Frequency: constant Radiation: yes - down left arm Aggravating factors: lifting and movement  Alleviating factors: rest  Status: worse Treatments attempted: none  Relief with NSAIDs?:  No NSAIDs Taken Weakness: no Numbness: no Decreased grip strength: no Redness: no Swelling: no Bruising: no Fevers: no   Past Medical History:  Diagnosis Date   Allergy    Anxiety    COPD (chronic obstructive pulmonary disease) (HCC)    Depression    GERD (gastroesophageal reflux disease)    Hypertension    Internal hemorrhoid    Lichen sclerosus    Osteoporosis    Spinal stenosis    Tobacco use     Past Surgical History:  Procedure Laterality Date   BREAST EXCISIONAL BIOPSY Bilateral    1980's - benign   BREAST SURGERY Bilateral    biopsy   COLONOSCOPY WITH PROPOFOL N/A 10/03/2019   Procedure: COLONOSCOPY WITH PROPOFOL;  Surgeon: Jonathon Bellows, MD;  Location: [facility] ENDOSCOPY;  Service: Gastroenterology;  Laterality: N/A;   EYE SURGERY Bilateral Feb-Mar 2016   HERNIA REPAIR     TUBAL LIGATION      Family History  Problem Relation Age of Onset   Dementia Mother    Anxiety disorder Mother    Hypertension Mother    Epilepsy Mother    Cancer Father        lung   Diabetes Brother    Colon cancer Brother    Hypertension Daughter    Cancer Daughter    Heart disease Maternal Grandfather        MI   Hypertension Brother     Diabetes Daughter    Arthritis Daughter    Breast cancer Neg Hx     Social History   Socioeconomic History   Marital status: Married    Spouse name: Not on file   Number of children: Not on file   Years of education: Not on file   Highest education level: 8th grade  Occupational History   Occupation: retired  Tobacco Use   Smoking status: Every Day    Packs/day: 0.25    Types: Cigarettes   Smokeless tobacco: Never   Tobacco comments:    1 pack every 3 days   Vaping Use   Vaping Use: Never used  Substance and Sexual Activity   Alcohol use: Yes    Alcohol/week: 3.0 standard drinks    Types: 3 Glasses of wine per week   Drug use: No   Sexual activity: Yes  Other Topics Concern   Not on file  Social History Narrative   Retired, has daughter living at home with her and her husband    Social Determinants of Radio broadcast assistant Strain: Low Risk    Difficulty of Paying Living Expenses: Not hard at all  Food Insecurity: No Food Insecurity   Worried About Charity fundraiser in the Last Year: Never true  Ran Out of Food in the Last Year: Never true  Transportation Needs: No Transportation Needs   Lack of Transportation (Medical): No   Lack of Transportation (Non-Medical): No  Physical Activity: Inactive   Days of Exercise per Week: 0 days   Minutes of Exercise per Session: 0 min  Stress: No Stress Concern Present   Feeling of Stress : Not at all  Social Connections: Not on file  Intimate Partner Violence: Not on file    Outpatient Medications Prior to Visit  Medication Sig Dispense Refill   albuterol (VENTOLIN HFA) 108 (90 Base) MCG/ACT inhaler Inhale 1-2 puffs into the lungs every 6 (six) hours as needed for wheezing or shortness of breath. 18 g 3   alendronate (FOSAMAX) 70 MG tablet Take 1 tablet (70 mg total) by mouth once a week. Take with a full glass of water on an empty stomach. 12 tablet 0   aspirin 81 MG tablet Take 81 mg by mouth daily.      fluticasone (FLONASE) 50 MCG/ACT nasal spray Place 2 sprays into both nostrils daily. 16 g 6   olmesartan (BENICAR) 20 MG tablet Take 1 tablet (20 mg total) by mouth daily. 90 tablet 4   omeprazole (PRILOSEC) 20 MG capsule Take 1 capsule (20 mg total) by mouth daily. 90 capsule 4   venlafaxine XR (EFFEXOR-XR) 75 MG 24 hr capsule Take 1 capsule (75 mg total) by mouth daily with breakfast. 90 capsule 0   VITAMIN D, CHOLECALCIFEROL, PO Take by mouth.     Turmeric (QC TUMERIC COMPLEX PO) Take by mouth. (Patient not taking: Reported on 03/04/2021)     No facility-administered medications prior to visit.    Allergies  Allergen Reactions   Elemental Sulfur    Midol [Acetaminophen] Hypertension    Review of Systems  Respiratory: Negative.    Cardiovascular: Negative.   Genitourinary: Negative.   Musculoskeletal:  Positive for arthralgias (left shoulder pain, limited movement).  Skin: Negative.   Neurological: Negative.       Objective:    Physical Exam Vitals and nursing note reviewed.  Constitutional:      General: She is not in acute distress.    Appearance: Normal appearance.  HENT:     Head: Normocephalic.  Eyes:     Conjunctiva/sclera: Conjunctivae normal.  Cardiovascular:     Rate and Rhythm: Normal rate.     Pulses: Normal pulses.  Pulmonary:     Effort: Pulmonary effort is normal.  Musculoskeletal:        General: Tenderness (left shoulder) present. No swelling.     Cervical back: Normal range of motion.     Comments: Very limited ROM to left shoulder due to severe pain  Skin:    General: Skin is warm.  Neurological:     General: No focal deficit present.     Mental Status: She is alert and oriented to person, place, and time.  Psychiatric:        Mood and Affect: Mood normal.        Behavior: Behavior normal.        Thought Content: Thought content normal.        Judgment: Judgment normal.    BP (!) 147/84 (BP Location: Right Arm, Patient Position: Sitting)    Pulse 97   Temp 98.4 F (36.9 C) (Oral)   Resp 20   Wt 101 lb 12.8 oz (46.2 kg)   SpO2 98%   BMI 19.88 kg/m  Wt Readings from  Last 3 Encounters:  03/04/21 101 lb 12.8 oz (46.2 kg)  10/30/20 101 lb 12.8 oz (46.2 kg)  10/02/20 101 lb 9.6 oz (46.1 kg)    Health Maintenance Due  Topic Date Due   COVID-19 Vaccine (4 - Booster for Moderna series) 06/21/2020    There are no preventive care reminders to display for this patient.   Lab Results  Component Value Date   TSH 2.670 03/19/2020   Lab Results  Component Value Date   WBC 8.9 03/19/2020   HGB 14.7 03/19/2020   HCT 43.2 03/19/2020   MCV 96 03/19/2020   PLT 285 03/19/2020   Lab Results  Component Value Date   NA 132 (L) 10/30/2020   K 4.9 10/30/2020   CO2 23 10/30/2020   GLUCOSE 82 10/30/2020   BUN 11 10/30/2020   CREATININE 0.74 10/30/2020   BILITOT 0.5 10/02/2020   ALKPHOS 61 10/02/2020   AST 18 10/02/2020   ALT 16 10/02/2020   PROT 7.2 10/02/2020   ALBUMIN 4.6 10/02/2020   CALCIUM 9.6 10/30/2020   EGFR 85 10/30/2020   Lab Results  Component Value Date   CHOL 158 10/02/2020   Lab Results  Component Value Date   HDL 86 10/02/2020   Lab Results  Component Value Date   LDLCALC 57 10/02/2020   Lab Results  Component Value Date   TRIG 83 10/02/2020   No results found for: CHOLHDL Lab Results  Component Value Date   HGBA1C 5.4 10/30/2020       Assessment & Plan:   Problem List Items Addressed This Visit   None Visit Diagnoses     Acute pain of left shoulder    -  Primary   Will obtain stat imaging. H/O osteoporsis, limited ROM concern for fracture. Ice, ibuprofen, and tramadol for pain. Limit movement until x-ray results received   Relevant Orders   DG Shoulder Left   Fall, initial encounter       Fall after getting foot stuck in bed. No knee pain, able to ambulate. See plan for left shoulder pain   Relevant Orders   DG Shoulder Left        Meds ordered this encounter   Medications   traMADol (ULTRAM) 50 MG tablet    Sig: Take 1 tablet (50 mg total) by mouth every 8 (eight) hours as needed for up to 5 days.    Dispense:  15 tablet    Refill:  0     Charyl Dancer, NP

## 2021-03-04 NOTE — Patient Instructions (Addendum)
Ibuprofen 400mg  every 6 hours for 3 days and then as needed Tramadol 50mg  as needed for pain Ice to shoulder for 15 minutes 4 times a day Get an x-ray today at [facility] Follow up in 1 week

## 2021-03-05 ENCOUNTER — Telehealth: Payer: Self-pay | Admitting: Nurse Practitioner

## 2021-03-05 NOTE — Telephone Encounter (Signed)
See result note.  

## 2021-03-05 NOTE — Telephone Encounter (Signed)
Copied from [place] [phone]. Topic: General - Other >> Mar 05, 2021  9:55 AM Leward Quan A wrote: Reason for CRM: Patient called in asking for the results of her Xrays from yesterday say that she feel like the shoulder is broken. Please call Ph#  [phone]

## 2021-03-11 ENCOUNTER — Encounter: Payer: Self-pay | Admitting: Nurse Practitioner

## 2021-03-11 ENCOUNTER — Other Ambulatory Visit: Payer: Self-pay

## 2021-03-11 ENCOUNTER — Ambulatory Visit (INDEPENDENT_AMBULATORY_CARE_PROVIDER_SITE_OTHER): Payer: Medicare Other | Admitting: Nurse Practitioner

## 2021-03-11 DIAGNOSIS — M25512 Pain in left shoulder: Secondary | ICD-10-CM | POA: Diagnosis not present

## 2021-03-11 NOTE — Progress Notes (Signed)
BP 134/77   Pulse 94   Temp 98 F (36.7 C) (Oral)   Wt 100 lb (45.4 kg)   SpO2 92%   BMI 19.53 kg/m    Subjective:    Patient ID: Lauren Nolan, female    DOB: 04-02-1946, 75 y.o.   MRN: [id number]  HPI: Lauren Nolan is a 75 y.o. female  Chief Complaint  Patient presents with   Shoulder Pain    Patient states she is still experiencing pain in her left shoulder and it now radiates down to her arm and hand. Patient states when she goes to bed and wake up she in is pain and it is a pain level 10. Patient states she has tried an old prescription Tramadol and it helps take the edge off.    SHOULDER PAIN Follow-up for left shoulder pain, initially seen 03/04/21 after a fall.  Has dx osteoporosis, imaging was performed at initial visit noting no fracture or dislocation.  She continues to have pain radiating down her arm, but feels it is somewhat improved as can move arm more.  When she wakes up in morning notices the most pain, but once moving around this improves.  Tried an old prescription of Tramadol, which offered some benefit, but not 100%.  Is right hand dominant. Duration: days Involved shoulder: bilateral Mechanism of injury: trauma Location: lateral Onset:gradual Severity: 1/10 at present and at worst a 10/10 Quality:  dull, aching, and throbbing Frequency: intermittent Radiation: yes down arm and into fingers at times Aggravating factors: lifting and movement  Alleviating factors:  Tramadol   Status: better Treatments attempted:  Tramadol   Relief with NSAIDs?:  No NSAIDs Taken Weakness: yes Numbness: no Decreased grip strength: no Redness: no Swelling: no Bruising: no Fevers: no   Relevant past medical, surgical, family and social history reviewed and updated as indicated. Interim medical history since our last visit reviewed. Allergies and medications reviewed and updated.  Review of Systems  Constitutional:  Negative for activity change, appetite change,  diaphoresis, fatigue and fever.  Respiratory:  Negative for cough, chest tightness and shortness of breath.   Cardiovascular:  Negative for chest pain, palpitations and leg swelling.  Gastrointestinal: Negative.   Musculoskeletal:  Positive for arthralgias.  Neurological: Negative.   Psychiatric/Behavioral: Negative.     Per HPI unless specifically indicated above     Objective:    BP 134/77   Pulse 94   Temp 98 F (36.7 C) (Oral)   Wt 100 lb (45.4 kg)   SpO2 92%   BMI 19.53 kg/m   Wt Readings from Last 3 Encounters:  03/11/21 100 lb (45.4 kg)  03/04/21 101 lb 12.8 oz (46.2 kg)  10/30/20 101 lb 12.8 oz (46.2 kg)    Physical Exam Vitals and nursing note reviewed.  Constitutional:      General: She is awake. She is not in acute distress.    Appearance: She is well-developed and well-groomed. She is not ill-appearing.  HENT:     Head: Normocephalic.     Right Ear: Hearing normal.     Left Ear: Hearing normal.  Eyes:     General: Lids are normal.        Right eye: No discharge.        Left eye: No discharge.     Conjunctiva/sclera: Conjunctivae normal.     Pupils: Pupils are equal, round, and reactive to light.  Neck:     Thyroid: No thyromegaly.  Vascular: No carotid bruit.  Cardiovascular:     Rate and Rhythm: Normal rate and regular rhythm.     Heart sounds: Normal heart sounds. No murmur heard.   No gallop.  Pulmonary:     Effort: Pulmonary effort is normal. No accessory muscle usage or respiratory distress.     Breath sounds: Normal breath sounds.  Abdominal:     General: Bowel sounds are normal.     Palpations: Abdomen is soft.  Musculoskeletal:     Right shoulder: Normal.     Left shoulder: No swelling, effusion, tenderness, bony tenderness or crepitus. Decreased range of motion. Decreased strength. Normal pulse.     Cervical back: Normal range of motion and neck supple.     Right lower leg: No edema.     Left lower leg: No edema.     Comments: Slight  decrease in flexion and extension on active ROM, passive ROM done without difficulty.  No tenderness.    Lymphadenopathy:     Cervical: No cervical adenopathy.  Skin:    General: Skin is warm and dry.  Neurological:     Mental Status: She is alert and oriented to person, place, and time.  Psychiatric:        Attention and Perception: Attention normal.        Mood and Affect: Mood normal.        Speech: Speech normal.        Behavior: Behavior normal. Behavior is cooperative.        Thought Content: Thought content normal.   STEROID INJECTION Procedure: Left Shoulder Steroid Injection  Description: After verbal consent and patient education on procedure, area prepped and draped using  semi-sterile technique. Using a posterior approach, a mixture of 5 cc of  1% Marcaine & 1 cc of Kenalog 40 was injected into left shoulder joint.  A bandage was then placed over the injection site.  Complications:  none Post Procedure Instructions: To the ER if any symptoms of erythema or swelling.   Follow Up: PRN   Results for orders placed or performed in visit on 01/22/21  Novel Coronavirus, NAA ([facility])   Specimen: Nasopharyngeal(NP) swabs in vial transport medium  Result Value Ref Range   SARS-CoV-2, NAA Not Detected Not Detected  SARS-COV-2, NAA 2 DAY TAT  Result Value Ref Range   SARS-CoV-2, NAA 2 DAY TAT Performed   Veritor Flu A/B Waived  Result Value Ref Range   Influenza A Negative Negative   Influenza B Negative Negative      Assessment & Plan:   Problem List Items Addressed This Visit       Other   Left shoulder pain    Acute and ongoing with some improvement noted, but not 100%.  Discussed steroid injection in office today, which patient consented to.  Provided in office.  Suspect some tendinopathy or impingement present.  No fracture on imaging.  She is aware if any bleeding or s/s infection immediately be seen.  Recommend Tylenol and Voltaren cream as needed at home + heat.   Return in one week.        Follow up plan: Return in about 1 week (around 03/18/2021) for Left shoulder pain.

## 2021-03-11 NOTE — Patient Instructions (Signed)
Shoulder Impingement Syndrome Shoulder impingement syndrome is a condition that causes pain when connective tissues (tendons) surrounding the shoulder joint become pinched. These tendons are part of the group of muscles and tissues that help to stabilize the shoulder (rotator cuff). Beneath the rotator cuff is a fluid-filled sac (bursa) that allows the muscles and tendons to glide smoothly. The bursa may become swollen or irritated (bursitis). Bursitis, swelling in the rotator cuff tendons, or both conditions can decrease how much space is under a bone in the shoulder joint (acromion), resulting in impingement. What are the causes? Shoulder impingement syndrome may be caused by bursitis or swelling of the rotator cuff tendons, which may result from: Repetitive overhead arm movements. Falling onto the shoulder. Weakness in the shoulder muscles. What increases the risk? You may be more likely to develop this condition if you: Play sports that involve throwing, such as baseball. Participate in sports such as tennis, volleyball, and swimming. Work as a Curator, Games developer, or Architect. Some people are also more likely to develop impingement syndrome because of the shape of their acromion bone. What are the signs or symptoms? The main symptom of this condition is pain on the front or side of the shoulder. The pain may: Get worse when lifting or raising the arm. Get worse at night. Wake you up from sleeping. Feel sharp when the shoulder is moved and then fade to an ache. Other symptoms may include: Tenderness. Stiffness. Inability to raise the arm above shoulder level or behind the body. Weakness. How is this diagnosed? This condition may be diagnosed based on: Your symptoms and medical history. A physical exam. Imaging tests, such as: X-rays. MRI. Ultrasound. How is this treated? This condition may be treated by: Resting your shoulder and avoiding all activities that cause pain or  put stress on the shoulder. Icing your shoulder. NSAIDs to help reduce pain and swelling. One or more injections of medicines to numb the area and reduce inflammation. Physical therapy. Surgery. This may be needed if nonsurgical treatments have not helped. Surgery may involve repairing the rotator cuff, reshaping the acromion, or removing the bursa. Follow these instructions at home: Managing pain, stiffness, and swelling  If directed, put ice on the injured area. Put ice in a plastic bag. Place a towel between your skin and the bag. Leave the ice on for 20 minutes, 2-3 times a day. Activity Rest and return to your normal activities as told by your health care provider. Ask your health care provider what activities are safe for you. Do exercises as told by your health care provider. General instructions Do not use any products that contain nicotine or tobacco, such as cigarettes, e-cigarettes, and chewing tobacco. These can delay healing. If you need help quitting, ask your health care provider. Ask your health care provider when it is safe for you to drive. Take over-the-counter and prescription medicines only as told by your health care provider. Keep all follow-up visits as told by your health care provider. This is important. How is this prevented? Give your body time to rest between periods of activity. Be safe and responsible while being active. This will help you avoid falls. Maintain physical fitness, including strength and flexibility. Contact a health care provider if: Your symptoms have not improved after 1-2 months of treatment and rest. You cannot lift your arm away from your body. Summary Shoulder impingement syndrome is a condition that causes pain when connective tissues (tendons) surrounding the shoulder joint become pinched. The  main symptom of this condition is pain on the front or side of the shoulder. This condition is usually treated with rest, ice, and pain  medicines as needed. This information is not intended to replace advice given to you by your health care provider. Make sure you discuss any questions you have with your health care provider. Document Revised: 07/23/2018 Document Reviewed: 09/23/2017 Elsevier Patient Education  2022 [employer].

## 2021-03-11 NOTE — Assessment & Plan Note (Signed)
Acute and ongoing with some improvement noted, but not 100%.  Discussed steroid injection in office today, which patient consented to.  Provided in office.  Suspect some tendinopathy or impingement present.  No fracture on imaging.  She is aware if any bleeding or s/s infection immediately be seen.  Recommend Tylenol and Voltaren cream as needed at home + heat.  Return in one week.

## 2021-03-18 ENCOUNTER — Ambulatory Visit: Payer: Medicare Other | Admitting: Nurse Practitioner

## 2021-03-21 ENCOUNTER — Ambulatory Visit (INDEPENDENT_AMBULATORY_CARE_PROVIDER_SITE_OTHER): Payer: Medicare Other | Admitting: Nurse Practitioner

## 2021-03-21 ENCOUNTER — Encounter: Payer: Self-pay | Admitting: Nurse Practitioner

## 2021-03-21 ENCOUNTER — Other Ambulatory Visit: Payer: Self-pay

## 2021-03-21 VITALS — BP 108/68 | HR 92 | Temp 98.3°F | Ht 60.5 in | Wt 96.6 lb

## 2021-03-21 DIAGNOSIS — Z0000 Encounter for general adult medical examination without abnormal findings: Secondary | ICD-10-CM

## 2021-03-21 DIAGNOSIS — J410 Simple chronic bronchitis: Secondary | ICD-10-CM

## 2021-03-21 DIAGNOSIS — I10 Essential (primary) hypertension: Secondary | ICD-10-CM | POA: Diagnosis not present

## 2021-03-21 DIAGNOSIS — M25512 Pain in left shoulder: Secondary | ICD-10-CM

## 2021-03-21 DIAGNOSIS — F329 Major depressive disorder, single episode, unspecified: Secondary | ICD-10-CM | POA: Diagnosis not present

## 2021-03-21 DIAGNOSIS — F17210 Nicotine dependence, cigarettes, uncomplicated: Secondary | ICD-10-CM

## 2021-03-21 DIAGNOSIS — K219 Gastro-esophageal reflux disease without esophagitis: Secondary | ICD-10-CM | POA: Diagnosis not present

## 2021-03-21 DIAGNOSIS — F411 Generalized anxiety disorder: Secondary | ICD-10-CM | POA: Diagnosis not present

## 2021-03-21 DIAGNOSIS — E782 Mixed hyperlipidemia: Secondary | ICD-10-CM

## 2021-03-21 DIAGNOSIS — M810 Age-related osteoporosis without current pathological fracture: Secondary | ICD-10-CM

## 2021-03-21 MED ORDER — ALBUTEROL SULFATE HFA 108 (90 BASE) MCG/ACT IN AERS: INHALATION_SPRAY | RESPIRATORY_TRACT | 3 refills | Status: DC

## 2021-03-21 MED ORDER — ROSUVASTATIN CALCIUM 5 MG PO TABS: 5.0000 mg | ORAL_TABLET | Freq: Every day | ORAL | 4 refills | Status: DC

## 2021-03-21 MED ORDER — ALENDRONATE SODIUM 70 MG PO TABS: 70.0000 mg | ORAL_TABLET | ORAL | 4 refills | Status: DC

## 2021-03-21 MED ORDER — VENLAFAXINE HCL ER 75 MG PO CP24
75.0000 mg | ORAL_CAPSULE | Freq: Every day | ORAL | 4 refills | Status: DC
Start: 2021-03-21 — End: 2021-10-16

## 2021-03-21 MED ORDER — OMEPRAZOLE 20 MG PO CPDR: 20.0000 mg | DELAYED_RELEASE_CAPSULE | Freq: Every day | ORAL | 4 refills | Status: DC

## 2021-03-21 NOTE — Assessment & Plan Note (Signed)
Chronic, stable.  Denies SI/HI.  Continue Effexor 75 MG daily as is beneficial at this time, could consider change to SSRI if elevation in BP ever presents.  At this time return in 6 months, sooner if worsening mood. 

## 2021-03-21 NOTE — Assessment & Plan Note (Signed)
Chronic, stable.  Denies SI/HI.  Continue current regimen and adjust as needed. 

## 2021-03-21 NOTE — Assessment & Plan Note (Signed)
Improved at this time after shoulder injection.

## 2021-03-21 NOTE — Assessment & Plan Note (Signed)
I have recommended complete cessation of tobacco use. I have discussed various options available for assistance with tobacco cessation including over the counter methods (Nicotine gum, patch and lozenges). We also discussed prescription options (Chantix, Nicotine Inhaler / Nasal Spray). The patient is not interested in pursuing any prescription tobacco cessation options at this time.  

## 2021-03-21 NOTE — Assessment & Plan Note (Signed)
Ongoing, continue Fosamax.  May consider period off Fosamax in future, at this time continue due to ongoing osteoporosis on imaging.  Vit D level annually.

## 2021-03-21 NOTE — Assessment & Plan Note (Signed)
LDL at goal on labs, but ASCVD elevated and is a smoker.  Will start low dose Rosuvastatin 5 MG, discussed with patient and she wishes to start this.  Educated on medication and side effects to report to provider.

## 2021-03-21 NOTE — Patient Instructions (Signed)

## 2021-03-21 NOTE — Assessment & Plan Note (Signed)
Chronic, stable.  Continue Prilosec daily and adjust as needed.  Mag level annually.

## 2021-03-21 NOTE — Assessment & Plan Note (Signed)
Chronic, ongoing.  BP below goal in office today.  At this time continue Olmesartan at current dose, but in future consider reduction to 10 MG.  CMP, CBC, TSH today.  Would consider changing Effexor to an SSRI due to effect of Effexor on BP if elevations ever present.  She is to check BP daily at home and document.  Recommend complete cessation of smoking and focus on DASH diet.  Return to office in 6 months, she is to alert provider if any elevations >130/80 on BP at home and be seen sooner if present.

## 2021-03-21 NOTE — Progress Notes (Signed)
BP 108/68   Pulse 92   Temp 98.3 F (36.8 C)   Ht 5' 0.5" (1.537 m)   Wt 96 lb 9.6 oz (43.8 kg)   SpO2 95%   BMI 18.56 kg/m    Subjective:    Patient ID: Lauren Nolan, female    DOB: 1946-02-19, 75 y.o.   MRN: [id number]  HPI: Lauren Nolan is a 75 y.o. female presenting on 03/21/2021 for comprehensive medical examination. Current medical complaints include:none  She currently lives with: husband Menopausal Symptoms: no  Shoulder pain left side is improved after recent shoulder injection on 03/11/21.  HYPERTENSION Continues on Olmesartan only, stopped Amlodipine due to low levels in past.  No current statin, last LDL 57.  Continues on Prilosec daily, which helps relieve gas on her stomach. Hypertension status: stable  Satisfied with current treatment? yes Duration of hypertension: chronic BP monitoring frequency:  rarely BP range: 110/70 BP medication side effects:  no Medication compliance: good compliance Aspirin: yes Recurrent headaches: no Visual changes: no Palpitations: no Dyspnea: no Chest pain: no Lower extremity edema: no Dizzy/lightheaded: no The 10-year ASCVD risk score (Arnett DK, et al., 2019) is: 19.4%   Values used to calculate the score:     Age: 75 years     Sex: Female     Is Non-Hispanic African American: No     Diabetic: No     Tobacco smoker: Yes     Systolic Blood Pressure: 833 mmHg     Is BP treated: Yes     HDL Cholesterol: 86 mg/dL     Total Cholesterol: 158 mg/dL  OSTEOPOROSIS Last DEXA 07/17/20 -- T score -3.5 and is taking Fosamax.   Satisfied with current treatment?: yes Medication side effects: no Medication compliance: good compliance Past osteoporosis medications/treatments: none Adequate calcium & vitamin D: yes Intolerance to bisphosphonates:no Weight bearing exercises: yes   COPD Has Albuterol, which uses 3 mornings a week.  December 2021 FEV1 85%. Continues to smoke, 1/2 PPD, not interested in quitting.  Has been  smoking since she was age 73.   COPD status: controlled Satisfied with current treatment?: yes Oxygen use: no Dyspnea frequency: none Cough frequency: none Rescue inhaler frequency:  once a month Limitation of activity: no Productive cough: none Last Spirometry: 03/19/20 Pneumovax: Up to Date Influenza: Up to Date   DEPRESSION/ANXIETY Continues on Effexor 75 MG.  Denies SI/HI. Mood status: stable Satisfied with current treatment?: yes Symptom severity: mild  Duration of current treatment : chronic Side effects: no Medication compliance: good compliance Psychotherapy/counseling: none Previous psychiatric medications: Effexor Depressed mood: no Anxious mood: occasionally Anhedonia: no Significant weight loss or gain: no Insomnia: none Fatigue: no Feelings of worthlessness or guilt: no Impaired concentration/indecisiveness: occasionally Suicidal ideations: no Hopelessness: no Crying spells: no Depression screen [facility] 2/9 03/21/2021 03/11/2021 10/02/2020 09/11/2020 08/13/2020  Decreased Interest 0 0 0 0 0  Down, Depressed, Hopeless 0 0 0 0 0  PHQ - 2 Score 0 0 0 0 0  Altered sleeping 0 0 0 - -  Tired, decreased energy 0 0 1 - -  Change in appetite 0 0 0 - -  Feeling bad or failure about yourself  0 0 0 - -  Trouble concentrating 0 0 1 - -  Moving slowly or fidgety/restless 0 0 0 - -  Suicidal thoughts 0 0 0 - -  PHQ-9 Score 0 0 2 - -  Difficult doing work/chores Not difficult at all Not difficult at  all - - -  Some recent data might be hidden    Fall Risk 05/25/2020 08/13/2020 09/11/2020 03/11/2021 03/21/2021  Falls in the past year? 0 0 0 1 1  Number of falls in past year - - - - -  Was there an injury with Fall? - - 0 1 1  Fall Risk Category Calculator - - 0 2 3  Fall Risk Category - - Low Moderate High  Patient Fall Risk Level - Low fall risk - Moderate fall risk Moderate fall risk  Patient at Risk for Falls Due to - Medication side effect Medication side effect History of  fall(s) History of fall(s)  Fall risk Follow up - Falls evaluation completed;Education provided;Falls prevention discussed Falls evaluation completed Falls evaluation completed Falls evaluation completed;Falls prevention discussed    Functional Status Survey: Is the patient deaf or have difficulty hearing?: No Does the patient have difficulty seeing, even when wearing glasses/contacts?: No Does the patient have difficulty concentrating, remembering, or making decisions?: No Does the patient have difficulty walking or climbing stairs?: No Does the patient have difficulty dressing or bathing?: No Does the patient have difficulty doing errands alone such as visiting a doctor's office or shopping?: No   Past Medical History:  Past Medical History:  Diagnosis Date   Allergy    Anxiety    COPD (chronic obstructive pulmonary disease) ([place])    Depression    GERD (gastroesophageal reflux disease)    Hypertension    Internal hemorrhoid    Lichen sclerosus    Osteoporosis    Spinal stenosis    Tobacco use     Surgical History:  Past Surgical History:  Procedure Laterality Date   BREAST EXCISIONAL BIOPSY Bilateral    1980's - benign   BREAST SURGERY Bilateral    biopsy   COLONOSCOPY WITH PROPOFOL N/A 10/03/2019   Procedure: COLONOSCOPY WITH PROPOFOL;  Surgeon: Jonathon Bellows, MD;  Location: [facility]COPY;  Service: Gastroenterology;  Laterality: N/A;   EYE SURGERY Bilateral Feb-Mar 2016   HERNIA REPAIR     TUBAL LIGATION      Medications:  Current Outpatient Medications on File Prior to Visit  Medication Sig   aspirin 81 MG tablet Take 81 mg by mouth daily.   olmesartan (BENICAR) 20 MG tablet Take 1 tablet (20 mg total) by mouth daily.   Turmeric (QC TUMERIC COMPLEX PO) Take by mouth.   VITAMIN D, CHOLECALCIFEROL, PO Take by mouth.   No current facility-administered medications on file prior to visit.    Allergies:  Allergies  Allergen Reactions   Elemental Sulfur    Midol  [Acetaminophen] Hypertension    Social History:  Social History   Socioeconomic History   Marital status: Married    Spouse name: Not on file   Number of children: Not on file   Years of education: Not on file   Highest education level: 8th grade  Occupational History   Occupation: retired  Tobacco Use   Smoking status: Every Day    Packs/day: 0.25    Types: Cigarettes   Smokeless tobacco: Never   Tobacco comments:    1 pack every 3 days   Vaping Use   Vaping Use: Never used  Substance and Sexual Activity   Alcohol use: Yes    Alcohol/week: 3.0 standard drinks    Types: 3 Glasses of wine per week   Drug use: No   Sexual activity: Yes  Other Topics Concern   Not on file  Social History Narrative   Retired, has daughter living at home with her and her husband    Social Determinants of Radio broadcast assistant Strain: Low Risk    Difficulty of Paying Living Expenses: Not hard at all  Food Insecurity: No Food Insecurity   Worried About Charity fundraiser in the Last Year: Never true   Arboriculturist in the Last Year: Never true  Transportation Needs: No Transportation Needs   Lack of Transportation (Medical): No   Lack of Transportation (Non-Medical): No  Physical Activity: Inactive   Days of Exercise per Week: 0 days   Minutes of Exercise per Session: 0 min  Stress: No Stress Concern Present   Feeling of Stress : Not at all  Social Connections: Not on file  Intimate Partner Violence: Not on file   Social History   Tobacco Use  Smoking Status Every Day   Packs/day: 0.25   Types: Cigarettes  Smokeless Tobacco Never  Tobacco Comments   1 pack every 3 days    Social History   Substance and Sexual Activity  Alcohol Use Yes   Alcohol/week: 3.0 standard drinks   Types: 3 Glasses of wine per week    Family History:  Family History  Problem Relation Age of Onset   Dementia Mother    Anxiety disorder Mother    Hypertension Mother    Epilepsy Mother     Cancer Father        lung   Diabetes Brother    Colon cancer Brother    Hypertension Daughter    Cancer Daughter    Heart disease Maternal Grandfather        MI   Hypertension Brother    Diabetes Daughter    Arthritis Daughter    Breast cancer Neg Hx     Past medical history, surgical history, medications, allergies, family history and social history reviewed with patient today and changes made to appropriate areas of the chart.   Review of Systems - husband All other ROS negative except what is listed above and in the HPI.      Objective:    BP 108/68   Pulse 92   Temp 98.3 F (36.8 C)   Ht 5' 0.5" (1.537 m)   Wt 96 lb 9.6 oz (43.8 kg)   SpO2 95%   BMI 18.56 kg/m   Wt Readings from Last 3 Encounters:  03/21/21 96 lb 9.6 oz (43.8 kg)  03/11/21 100 lb (45.4 kg)  03/04/21 101 lb 12.8 oz (46.2 kg)    Physical Exam Vitals and nursing note reviewed.  Constitutional:      General: She is awake. She is not in acute distress.    Appearance: She is well-developed. She is not ill-appearing.  HENT:     Head: Normocephalic and atraumatic.     Right Ear: Hearing, tympanic membrane, ear canal and external ear normal. No drainage.     Left Ear: Hearing, tympanic membrane, ear canal and external ear normal. No drainage.     Nose: Nose normal.     Right Sinus: No maxillary sinus tenderness or frontal sinus tenderness.     Left Sinus: No maxillary sinus tenderness or frontal sinus tenderness.     Mouth/Throat:     Mouth: Mucous membranes are moist.     Pharynx: Oropharynx is clear. Uvula midline. No pharyngeal swelling, oropharyngeal exudate or posterior oropharyngeal erythema.  Eyes:     General: Lids are normal.  Right eye: No discharge.        Left eye: No discharge.     Extraocular Movements: Extraocular movements intact.     Conjunctiva/sclera: Conjunctivae normal.     Pupils: Pupils are equal, round, and reactive to light.     Visual Fields: Right eye visual  fields normal and left eye visual fields normal.  Neck:     Thyroid: No thyromegaly.     Vascular: No carotid bruit.     Trachea: Trachea normal.  Cardiovascular:     Rate and Rhythm: Normal rate and regular rhythm.     Heart sounds: Normal heart sounds. No murmur heard.   No gallop.  Pulmonary:     Effort: Pulmonary effort is normal. No accessory muscle usage or respiratory distress.     Breath sounds: Normal breath sounds.  Abdominal:     General: Bowel sounds are normal.     Palpations: Abdomen is soft. There is no hepatomegaly or splenomegaly.     Tenderness: There is no abdominal tenderness.  Musculoskeletal:        General: Normal range of motion.     Cervical back: Normal range of motion and neck supple.     Right lower leg: No edema.     Left lower leg: No edema.  Lymphadenopathy:     Head:     Right side of head: No submental, submandibular, tonsillar, preauricular or posterior auricular adenopathy.     Left side of head: No submental, submandibular, tonsillar, preauricular or posterior auricular adenopathy.     Cervical: No cervical adenopathy.  Skin:    General: Skin is warm and dry.     Capillary Refill: Capillary refill takes less than 2 seconds.     Findings: No rash.  Neurological:     Mental Status: She is alert and oriented to person, place, and time.     Gait: Gait is intact.     Deep Tendon Reflexes: Reflexes are normal and symmetric.     Reflex Scores:      Brachioradialis reflexes are 2+ on the right side and 2+ on the left side.      Patellar reflexes are 2+ on the right side and 2+ on the left side. Psychiatric:        Attention and Perception: Attention normal.        Mood and Affect: Mood normal.        Speech: Speech normal.        Behavior: Behavior normal. Behavior is cooperative.        Thought Content: Thought content normal.        Judgment: Judgment normal.   Results for orders placed or performed in visit on 01/22/21  Novel Coronavirus,  NAA ([facility])   Specimen: Nasopharyngeal(NP) swabs in vial transport medium  Result Value Ref Range   SARS-CoV-2, NAA Not Detected Not Detected  SARS-COV-2, NAA 2 DAY TAT  Result Value Ref Range   SARS-CoV-2, NAA 2 DAY TAT Performed   Veritor Flu A/B Waived  Result Value Ref Range   Influenza A Negative Negative   Influenza B Negative Negative      Assessment & Plan:   Problem List Items Addressed This Visit       Cardiovascular and Mediastinum   Hypertension    Chronic, ongoing.  BP below goal in office today.  At this time continue Olmesartan at current dose, but in future consider reduction to 10 MG.  CMP, CBC, TSH today.  Would consider changing Effexor to an SSRI due to effect of Effexor on BP if elevations ever present.  She is to check BP daily at home and document.  Recommend complete cessation of smoking and focus on DASH diet.  Return to office in 6 months, she is to alert provider if any elevations >130/80 on BP at home and be seen sooner if present.      Relevant Medications   rosuvastatin (CRESTOR) 5 MG tablet   Other Relevant Orders   CBC with Differential/Platelet   Comprehensive metabolic panel   Lipid Panel w/o Chol/HDL Ratio   TSH     Respiratory   COPD (chronic obstructive pulmonary disease) ([place]) - Primary    Chronic, ongoing with recent FEV 1 = 85% in December 2021.  Continue Albuterol as needed and consider maintenance in future.  Spirometry next visit.  Recommend complete cessation of smoking.      Relevant Medications   albuterol (VENTOLIN HFA) 108 (90 Base) MCG/ACT inhaler   Other Relevant Orders   CBC with Differential/Platelet     Digestive   GERD (gastroesophageal reflux disease)    Chronic, stable.  Continue Prilosec daily and adjust as needed.  Mag level annually.      Relevant Medications   omeprazole (PRILOSEC) 20 MG capsule   Other Relevant Orders   Magnesium     Musculoskeletal and Integument   Osteoporosis    Ongoing, continue  Fosamax.  May consider period off Fosamax in future, at this time continue due to ongoing osteoporosis on imaging.  Vit D level annually.      Relevant Medications   alendronate (FOSAMAX) 70 MG tablet   Other Relevant Orders   VITAMIN D 25 Hydroxy (Vit-D Deficiency, Fractures)   PTH, Intact and Calcium     Other   Depression    Chronic, stable.  Denies SI/HI.  Continue Effexor 75 MG daily as is beneficial at this time, could consider change to SSRI if elevation in BP ever presents.  At this time return in 6 months, sooner if worsening mood.      Relevant Medications   venlafaxine XR (EFFEXOR-XR) 75 MG 24 hr capsule   Other Relevant Orders   TSH   Generalized anxiety disorder    Chronic, stable.  Denies SI/HI.  Continue current regimen and adjust as needed.      Relevant Medications   venlafaxine XR (EFFEXOR-XR) 75 MG 24 hr capsule   Left shoulder pain    Improved at this time after shoulder injection.      Mixed hyperlipidemia    LDL at goal on labs, but ASCVD elevated and is a smoker.  Will start low dose Rosuvastatin 5 MG, discussed with patient and she wishes to start this.  Educated on medication and side effects to report to provider.      Relevant Medications   rosuvastatin (CRESTOR) 5 MG tablet   Nicotine dependence, cigarettes, uncomplicated    I have recommended complete cessation of tobacco use. I have discussed various options available for assistance with tobacco cessation including over the counter methods (Nicotine gum, patch and lozenges). We also discussed prescription options (Chantix, Nicotine Inhaler / Nasal Spray). The patient is not interested in pursuing any prescription tobacco cessation options at this time.       Other Visit Diagnoses     Encounter for annual physical exam       Annual physical today with labs.  Health maintenance reviewed with patient.  Follow up plan: Return in about 6 months (around 09/19/2021) for HTN/HLD,  OSTEOPOROSIS, COPD, MOOD -- needs spirometry.   LABORATORY TESTING:  - Pap smear: not applicable  IMMUNIZATIONS:   - Tdap: Tetanus vaccination status reviewed: last tetanus booster within 10 years. - Influenza: Up To Date - Pneumovax: Up to date - Prevnar: Up to date - HPV: Not applicable - Zostavax vaccine: Up to date - Covid == Up To Date  SCREENING: -Mammogram: Up To Date - Colonoscopy: Up to date  - Bone Density: Up To Date -Hearing Test: Not applicable  -Spirometry: Up To Date  PATIENT COUNSELING:   Advised to take 1 mg of folate supplement per day if capable of pregnancy.   Sexuality: Discussed sexually transmitted diseases, partner selection, use of condoms, avoidance of unintended pregnancy  and contraceptive alternatives.   Advised to avoid cigarette smoking.  I discussed with the patient that most people either abstain from alcohol or drink within safe limits (<=14/week and <=4 drinks/occasion for males, <=7/weeks and <= 3 drinks/occasion for females) and that the risk for alcohol disorders and other health effects rises proportionally with the number of drinks per week and how often a drinker exceeds daily limits.  Discussed cessation/primary prevention of drug use and availability of treatment for abuse.   Diet: Encouraged to adjust caloric intake to maintain  or achieve ideal body weight, to reduce intake of dietary saturated fat and total fat, to limit sodium intake by avoiding high sodium foods and not adding table salt, and to maintain adequate dietary potassium and calcium preferably from fresh fruits, vegetables, and low-fat dairy products.    Stressed the importance of regular exercise  Injury prevention: Discussed safety belts, safety helmets, smoke detector, smoking near bedding or upholstery.   Dental health: Discussed importance of regular tooth brushing, flossing, and dental visits.    NEXT PREVENTATIVE PHYSICAL DUE IN 1 YEAR. Return in about 6  months (around 09/19/2021) for HTN/HLD, OSTEOPOROSIS, COPD, MOOD -- needs spirometry.

## 2021-03-21 NOTE — Assessment & Plan Note (Signed)
Chronic, ongoing with recent FEV 1 = 85% in December 2021.  Continue Albuterol as needed and consider maintenance in future.  Spirometry next visit.  Recommend complete cessation of smoking.

## 2021-03-22 LAB — CBC WITH DIFFERENTIAL/PLATELET
Basophils Absolute: 0.1 10*3/uL (ref 0.0–0.2)
Basos: 1 %
EOS (ABSOLUTE): 0.1 10*3/uL (ref 0.0–0.4)
Eos: 1 %
Hematocrit: 44.2 % (ref 34.0–46.6)
Hemoglobin: 14.8 g/dL (ref 11.1–15.9)
Immature Grans (Abs): 0 10*3/uL (ref 0.0–0.1)
Immature Granulocytes: 0 %
Lymphocytes Absolute: 2.2 10*3/uL (ref 0.7–3.1)
Lymphs: 21 %
MCH: 32.9 pg (ref 26.6–33.0)
MCHC: 33.5 g/dL (ref 31.5–35.7)
MCV: 98 fL — ABNORMAL HIGH (ref 79–97)
Monocytes Absolute: 1.3 10*3/uL — ABNORMAL HIGH (ref 0.1–0.9)
Monocytes: 12 %
Neutrophils Absolute: 7 10*3/uL (ref 1.4–7.0)
Neutrophils: 65 %
Platelets: 332 10*3/uL (ref 150–450)
RBC: 4.5 x10E6/uL (ref 3.77–5.28)
RDW: 11.8 % (ref 11.7–15.4)
WBC: 10.7 10*3/uL (ref 3.4–10.8)

## 2021-03-22 LAB — LIPID PANEL W/O CHOL/HDL RATIO
Cholesterol, Total: 165 mg/dL (ref 100–199)
HDL: 103 mg/dL (ref >39)
LDL Chol Calc (NIH): 50 mg/dL (ref 0–99)
Triglycerides: 58 mg/dL (ref 0–149)
VLDL Cholesterol Cal: 12 mg/dL (ref 5–40)

## 2021-03-22 LAB — COMPREHENSIVE METABOLIC PANEL
ALT: 16 IU/L (ref 0–32)
AST: 23 IU/L (ref 0–40)
Albumin/Globulin Ratio: 1.8 (ref 1.2–2.2)
Albumin: 4.6 g/dL (ref 3.7–4.7)
Alkaline Phosphatase: 63 IU/L (ref 44–121)
BUN/Creatinine Ratio: 16 (ref 12–28)
BUN: 14 mg/dL (ref 8–27)
Bilirubin Total: 0.8 mg/dL (ref 0.0–1.2)
CO2: 24 mmol/L (ref 20–29)
Calcium: 9.9 mg/dL (ref 8.7–10.3)
Chloride: 94 mmol/L — ABNORMAL LOW (ref 96–106)
Creatinine, Ser: 0.88 mg/dL (ref 0.57–1.00)
Globulin, Total: 2.5 g/dL (ref 1.5–4.5)
Glucose: 71 mg/dL (ref 70–99)
Potassium: 4.7 mmol/L (ref 3.5–5.2)
Sodium: 132 mmol/L — ABNORMAL LOW (ref 134–144)
Total Protein: 7.1 g/dL (ref 6.0–8.5)
eGFR: 69 mL/min/{1.73_m2} (ref >59)

## 2021-03-22 LAB — MAGNESIUM: Magnesium: 2 mg/dL (ref 1.6–2.3)

## 2021-03-22 LAB — PTH, INTACT AND CALCIUM: PTH: 29 pg/mL (ref 15–65)

## 2021-03-22 LAB — TSH: TSH: 1.93 u[IU]/mL (ref 0.450–4.500)

## 2021-03-22 LAB — VITAMIN D 25 HYDROXY (VIT D DEFICIENCY, FRACTURES): Vit D, 25-Hydroxy: 54.2 ng/mL (ref 30.0–100.0)

## 2021-03-22 NOTE — Progress Notes (Signed)
Good evening, please let Lauren Nolan know her labs have returned and everything remains baseline for her.  We do not need to change any medications, everything remains stable.  If any questions please let me know.   Keep being amazing!!  Thank you for allowing me to participate in your care.  I appreciate you. Kindest regards, Mariaisabel Bodiford

## 2021-05-22 ENCOUNTER — Encounter: Payer: Self-pay | Admitting: Nurse Practitioner

## 2021-07-05 ENCOUNTER — Encounter: Payer: Self-pay | Admitting: Family Medicine

## 2021-07-05 ENCOUNTER — Ambulatory Visit (INDEPENDENT_AMBULATORY_CARE_PROVIDER_SITE_OTHER): Payer: Medicare Other | Admitting: Family Medicine

## 2021-07-05 ENCOUNTER — Other Ambulatory Visit: Payer: Self-pay

## 2021-07-05 VITALS — BP 131/82 | HR 99 | Temp 98.5°F | Wt 98.6 lb

## 2021-07-05 DIAGNOSIS — M25512 Pain in left shoulder: Secondary | ICD-10-CM | POA: Diagnosis not present

## 2021-07-05 DIAGNOSIS — G8929 Other chronic pain: Secondary | ICD-10-CM

## 2021-07-05 MED ORDER — TRIAMCINOLONE ACETONIDE 40 MG/ML IJ SUSP
40.0000 mg | Freq: Once | INTRAMUSCULAR | Status: AC
  Administered 2021-07-05: 40 mg via INTRAMUSCULAR

## 2021-07-05 MED ORDER — PREDNISONE 10 MG PO TABS: ORAL_TABLET | ORAL | 0 refills | Status: DC

## 2021-07-05 NOTE — Progress Notes (Signed)
? ?BP 131/82   Pulse 99   Temp 98.5 ?F (36.9 ?C)   Wt 98 lb 9.6 oz (44.7 kg)   SpO2 96%   BMI 18.94 kg/m?   ? ?Subjective:  ? ? Patient ID: Lauren Nolan, female    DOB: June 09, 1945, 76 y.o.   MRN: [id number] ? ?HPI: ?Lauren Nolan is a 76 y.o. female ? ?Chief Complaint  ?Patient presents with  ? Shoulder Pain  ?  Patient states November she fell and hurt her left shoulder. Patient came in, x-rays were ordered and no fractures were noted. Patient states it continues to hurt. Patient takes tylenol $RemoveBefor'800mg'NrIXfgoHSmus$  does not help, would like to know if she can get gabapentin.   ? ?SHOULDER PAIN ?Duration: 4 months ?Involved shoulder: left ?Mechanism of injury: trauma ?Location: diffuse ?Onset:sudden ?Severity: severe  ?Quality:  dull ?Frequency: constant ?Radiation: into her arm ?Aggravating factors: moving her shoulder  ?Alleviating factors: nothing  ?Status: worse ?Treatments attempted: rest, ice, heat, APAP, ibuprofen, and aleve  ?Relief with NSAIDs?:  No NSAIDs Taken ?Weakness: no ?Numbness: no ?Decreased grip strength: no ?Redness: no ?Swelling: no ?Bruising: no ?Fevers: no ? ?Relevant past medical, surgical, family and social history reviewed and updated as indicated. Interim medical history since our last visit reviewed. ?Allergies and medications reviewed and updated. ? ?Review of Systems  ?Constitutional: Negative.   ?Respiratory: Negative.    ?Cardiovascular: Negative.   ?Gastrointestinal: Negative.   ?Musculoskeletal:  Positive for arthralgias and myalgias. Negative for back pain, gait problem, joint swelling, neck pain and neck stiffness.  ?Skin: Negative.   ?Neurological: Negative.   ?Psychiatric/Behavioral: Negative.    ? ?Per HPI unless specifically indicated above ? ?   ?Objective:  ?  ?BP 131/82   Pulse 99   Temp 98.5 ?F (36.9 ?C)   Wt 98 lb 9.6 oz (44.7 kg)   SpO2 96%   BMI 18.94 kg/m?   ?Wt Readings from Last 3 Encounters:  ?07/05/21 98 lb 9.6 oz (44.7 kg)  ?03/21/21 96 lb 9.6 oz (43.8 kg)  ?03/11/21  100 lb (45.4 kg)  ?  ?Physical Exam ?Vitals and nursing note reviewed.  ?Constitutional:   ?   General: She is not in acute distress. ?   Appearance: Normal appearance. She is not ill-appearing, toxic-appearing or diaphoretic.  ?HENT:  ?   Head: Normocephalic and atraumatic.  ?   Right Ear: External ear normal.  ?   Left Ear: External ear normal.  ?   Nose: Nose normal.  ?   Mouth/Throat:  ?   Mouth: Mucous membranes are moist.  ?   Pharynx: Oropharynx is clear.  ?Eyes:  ?   General: No scleral icterus.    ?   Right eye: No discharge.     ?   Left eye: No discharge.  ?   Extraocular Movements: Extraocular movements intact.  ?   Conjunctiva/sclera: Conjunctivae normal.  ?   Pupils: Pupils are equal, round, and reactive to light.  ?Cardiovascular:  ?   Rate and Rhythm: Normal rate and regular rhythm.  ?   Pulses: Normal pulses.  ?   Heart sounds: Normal heart sounds. No murmur heard. ?  No friction rub. No gallop.  ?Pulmonary:  ?   Effort: Pulmonary effort is normal. No respiratory distress.  ?   Breath sounds: Normal breath sounds. No stridor. No wheezing, rhonchi or rales.  ?Chest:  ?   Chest wall: No tenderness.  ?Musculoskeletal:     ?  General: Tenderness (joint line of L shoulder) present. No swelling, deformity or signs of injury.  ?   Cervical back: Normal range of motion and neck supple.  ?   Right lower leg: No edema.  ?   Left lower leg: No edema.  ?Skin: ?   General: Skin is warm and dry.  ?   Capillary Refill: Capillary refill takes less than 2 seconds.  ?   Coloration: Skin is not jaundiced or pale.  ?   Findings: No bruising, erythema, lesion or rash.  ?Neurological:  ?   General: No focal deficit present.  ?   Mental Status: She is alert and oriented to person, place, and time. Mental status is at baseline.  ?Psychiatric:     ?   Mood and Affect: Mood normal.     ?   Behavior: Behavior normal.     ?   Thought Content: Thought content normal.     ?   Judgment: Judgment normal.  ? ? ?Results for orders  placed or performed in visit on 03/21/21  ?CBC with Differential/Platelet  ?Result Value Ref Range  ? WBC 10.7 3.4 - 10.8 x10E3/uL  ? RBC 4.50 3.77 - 5.28 x10E6/uL  ? Hemoglobin 14.8 11.1 - 15.9 g/dL  ? Hematocrit 44.2 34.0 - 46.6 %  ? MCV 98 (H) 79 - 97 fL  ? MCH 32.9 26.6 - 33.0 pg  ? MCHC 33.5 31.5 - 35.7 g/dL  ? RDW 11.8 11.7 - 15.4 %  ? Platelets 332 150 - 450 x10E3/uL  ? Neutrophils 65 Not Estab. %  ? Lymphs 21 Not Estab. %  ? Monocytes 12 Not Estab. %  ? Eos 1 Not Estab. %  ? Basos 1 Not Estab. %  ? Neutrophils Absolute 7.0 1.4 - 7.0 x10E3/uL  ? Lymphocytes Absolute 2.2 0.7 - 3.1 x10E3/uL  ? Monocytes Absolute 1.3 (H) 0.1 - 0.9 x10E3/uL  ? EOS (ABSOLUTE) 0.1 0.0 - 0.4 x10E3/uL  ? Basophils Absolute 0.1 0.0 - 0.2 x10E3/uL  ? Immature Granulocytes 0 Not Estab. %  ? Immature Grans (Abs) 0.0 0.0 - 0.1 x10E3/uL  ?Comprehensive metabolic panel  ?Result Value Ref Range  ? Glucose 71 70 - 99 mg/dL  ? BUN 14 8 - 27 mg/dL  ? Creatinine, Ser 0.88 0.57 - 1.00 mg/dL  ? eGFR 69 >59 mL/min/1.73  ? BUN/Creatinine Ratio 16 12 - 28  ? Sodium 132 (L) 134 - 144 mmol/L  ? Potassium 4.7 3.5 - 5.2 mmol/L  ? Chloride 94 (L) 96 - 106 mmol/L  ? CO2 24 20 - 29 mmol/L  ? Calcium 9.9 8.7 - 10.3 mg/dL  ? Total Protein 7.1 6.0 - 8.5 g/dL  ? Albumin 4.6 3.7 - 4.7 g/dL  ? Globulin, Total 2.5 1.5 - 4.5 g/dL  ? Albumin/Globulin Ratio 1.8 1.2 - 2.2  ? Bilirubin Total 0.8 0.0 - 1.2 mg/dL  ? Alkaline Phosphatase 63 44 - 121 IU/L  ? AST 23 0 - 40 IU/L  ? ALT 16 0 - 32 IU/L  ?Lipid Panel w/o Chol/HDL Ratio  ?Result Value Ref Range  ? Cholesterol, Total 165 100 - 199 mg/dL  ? Triglycerides 58 0 - 149 mg/dL  ? HDL 103 >39 mg/dL  ? VLDL Cholesterol Cal 12 5 - 40 mg/dL  ? LDL Chol Calc (NIH) 50 0 - 99 mg/dL  ?TSH  ?Result Value Ref Range  ? TSH 1.930 0.450 - 4.500 uIU/mL  ?Magnesium  ?Result Value Ref Range  ?  Magnesium 2.0 1.6 - 2.3 mg/dL  ?VITAMIN D 25 Hydroxy (Vit-D Deficiency, Fractures)  ?Result Value Ref Range  ? Vit D, 25-Hydroxy 54.2 30.0 -  100.0 ng/mL  ?PTH, Intact and Calcium  ?Result Value Ref Range  ? PTH 29 15 - 65 pg/mL  ? PTH Interp Comment   ? ?   ?Assessment & Plan:  ? ?Problem List Items Addressed This Visit   ? ?  ? Other  ? Left shoulder pain - Primary  ?  Will treat with triamcinalone and start prednisone and stretches. Will send for PT. Follow up in about 3 weeks. Call with any concerns. ?  ?  ? Relevant Orders  ? Ambulatory referral to Physical Therapy  ?  ? ?Follow up plan: ?Return 3-4 weeks. ? ? ? ? ? ?

## 2021-07-06 ENCOUNTER — Encounter: Payer: Self-pay | Admitting: Family Medicine

## 2021-07-06 NOTE — Assessment & Plan Note (Signed)
Will treat with triamcinalone and start prednisone and stretches. Will send for PT. Follow up in about 3 weeks. Call with any concerns. ?

## 2021-07-20 NOTE — Patient Instructions (Signed)
Shoulder Pain ?Many things can cause shoulder pain, including: ?An injury. ?Moving the shoulder in the same way again and again (overuse). ?Joint pain (arthritis). ?Pain can come from: ?Swelling and irritation (inflammation) of any part of the shoulder. ?An injury to the shoulder joint. ?An injury to: ?Tissues that connect muscle to bone (tendons). ?Tissues that connect bones to each other (ligaments). ?Bones. ?Follow these instructions at home: ?Watch for changes in your symptoms. Let your doctor know about them. Follow these instructions to help with your pain. ?If you have a sling: ?Wear the sling as told by your doctor. Remove it only as told by your doctor. ?Loosen the sling if your fingers: ?Tingle. ?Become numb. ?Turn cold and blue. ?Keep the sling clean. ?If the sling is not waterproof: ?Do not let it get wet. ?Take the sling off when you shower or bathe. ?Managing pain, stiffness, and swelling ? ?If told, put ice on the painful area: ?Put ice in a plastic bag. ?Place a towel between your skin and the bag. ?Leave the ice on for 20 minutes, 2-3 times a day. Stop putting ice on if it does not help with the pain. ?Squeeze a soft ball or a foam pad as much as possible. This prevents swelling in the shoulder. It also helps to strengthen the arm. ?General instructions ?Take over-the-counter and prescription medicines only as told by your doctor. ?Keep all follow-up visits as told by your doctor. This is important. ?Contact a doctor if: ?Your pain gets worse. ?Medicine does not help your pain. ?You have new pain in your arm, hand, or fingers. ?Get help right away if: ?Your arm, hand, or fingers: ?Tingle. ?Are numb. ?Are swollen. ?Are painful. ?Turn white or blue. ?Summary ?Shoulder pain can be caused by many things. These include injury, moving the shoulder in the same away again and again, and joint pain. ?Watch for changes in your symptoms. Let your doctor know about them. ?This condition may be treated with a  sling, ice, and pain medicine. ?Contact your doctor if the pain gets worse or you have new pain. Get help right away if your arm, hand, or fingers tingle or get numb, swollen, or painful. ?Keep all follow-up visits as told by your doctor. This is important. ?This information is not intended to replace advice given to you by your health care provider. Make sure you discuss any questions you have with your health care provider. ?Document Revised: 12/14/2020 Document Reviewed: 12/14/2020 ?Elsevier Patient Education ? [place]. ? ?

## 2021-07-26 ENCOUNTER — Encounter: Payer: Self-pay | Admitting: Nurse Practitioner

## 2021-07-26 ENCOUNTER — Ambulatory Visit (INDEPENDENT_AMBULATORY_CARE_PROVIDER_SITE_OTHER): Payer: Medicare Other | Admitting: Nurse Practitioner

## 2021-07-26 VITALS — BP 124/76 | HR 92 | Temp 97.6°F | Ht 60.5 in | Wt 96.6 lb

## 2021-07-26 DIAGNOSIS — L821 Other seborrheic keratosis: Secondary | ICD-10-CM | POA: Diagnosis not present

## 2021-07-26 DIAGNOSIS — M25512 Pain in left shoulder: Secondary | ICD-10-CM | POA: Diagnosis not present

## 2021-07-26 DIAGNOSIS — G8929 Other chronic pain: Secondary | ICD-10-CM | POA: Diagnosis not present

## 2021-07-26 NOTE — Assessment & Plan Note (Signed)
Chronic and improving today with increased ROM.  At this time she refuses ortho visit, prefers to continue self PT at home.  Reports steroid injection and medications have helped.  Return in June as scheduled and consider ortho visit in future. ?

## 2021-07-26 NOTE — Assessment & Plan Note (Signed)
Small lesion to upper left arm, discussed with patient and will remove next visit and send for pathology == suspect SK or AK. ?

## 2021-07-26 NOTE — Progress Notes (Signed)
? ?BP 124/76   Pulse 92   Temp 97.6 ?F (36.4 ?C) (Oral)   Ht 5' 0.5" (1.537 m)   Wt 96 lb 9.6 oz (43.8 kg)   SpO2 (!) 79%   BMI 18.56 kg/m?   ? ?Subjective:  ? ? Patient ID: Lauren Nolan, female    DOB: [birth year], 76 y.o.   MRN: [id number] ? ?HPI: ?Lauren Nolan is a 76 y.o. female ? ?Chief Complaint  ?Patient presents with  ? Shoulder Pain  ?  Patient is here for follow up on Shoulder Pain. Patient says after receiving the second injection it has been a lot better.   ? Arm Problem  ?  Patient states she would like the provider to take a look at a spot she found. Patient states it has been there a "good while" but would like it looked at by provider at today's visit.   ? ?SHOULDER PAIN ?Follow-up for left shoulder pain, initially seen 03/04/21 after a fall.  Has dx osteoporosis, imaging was performed at initial visit noting no fracture or dislocation.  Received injection on 03/11/21 and then on 07/05/20 received Prednisone taper.  She reports some improvement at this time, can move more, but still having occasional 10/10 pain with certain moves.  Refuses ortho visit, but if not better in upcoming months may consider.   ?Duration: days ?Involved shoulder: left ?Mechanism of injury: trauma ?Location: lateral ?Onset:gradual ?Severity: 0/10 at present and when moving 10/10 ?Quality:  dull, aching, and throbbing ?Frequency: intermittent ?Radiation: yes down arm and into fingers at times ?Aggravating factors: lifting and movement  ?Alleviating factors:  Tramadol, Prednisone, steroid injection ?Status: better ?Treatments attempted:  Tramadol   ?Relief with NSAIDs?:  No NSAIDs Taken ?Weakness: yes ?Numbness: no ?Decreased grip strength: no ?Redness: no ?Swelling: no ?Bruising: no ?Fevers: no  ? ?SKIN LESION ?To left upper arm.   ?Duration:  years ?Location: upper left arm ?Painful: no ?Itching: no ?Onset: gradual ?Context: not changing ?Associated signs and symptoms: no changes ?History of skin cancer: no ?History  of precancerous skin lesions: no ?Family history of skin cancer: no  ? ?Relevant past medical, surgical, family and social history reviewed and updated as indicated. Interim medical history since our last visit reviewed. ?Allergies and medications reviewed and updated. ? ?Review of Systems  ?Constitutional:  Negative for activity change, appetite change, diaphoresis, fatigue and fever.  ?Respiratory:  Negative for cough, chest tightness and shortness of breath.   ?Cardiovascular:  Negative for chest pain, palpitations and leg swelling.  ?Gastrointestinal: Negative.   ?Musculoskeletal:  Positive for arthralgias.  ?Neurological: Negative.   ?Psychiatric/Behavioral: Negative.    ? ?Per HPI unless specifically indicated above ? ?   ?Objective:  ?  ?BP 124/76   Pulse 92   Temp 97.6 ?F (36.4 ?C) (Oral)   Ht 5' 0.5" (1.537 m)   Wt 96 lb 9.6 oz (43.8 kg)   SpO2 (!) 79%   BMI 18.56 kg/m?   ?Wt Readings from Last 3 Encounters:  ?07/26/21 96 lb 9.6 oz (43.8 kg)  ?07/05/21 98 lb 9.6 oz (44.7 kg)  ?03/21/21 96 lb 9.6 oz (43.8 kg)  ?  ?Physical Exam ?Vitals and nursing note reviewed.  ?Constitutional:   ?   General: She is awake. She is not in acute distress. ?   Appearance: She is well-developed and well-groomed. She is not ill-appearing.  ?HENT:  ?   Head: Normocephalic.  ?   Right Ear: Hearing normal.  ?  Left Ear: Hearing normal.  ?Eyes:  ?   General: Lids are normal.     ?   Right eye: No discharge.     ?   Left eye: No discharge.  ?   Conjunctiva/sclera: Conjunctivae normal.  ?   Pupils: Pupils are equal, round, and reactive to light.  ?Neck:  ?   Thyroid: No thyromegaly.  ?   Vascular: No carotid bruit.  ?Cardiovascular:  ?   Rate and Rhythm: Normal rate and regular rhythm.  ?   Heart sounds: Normal heart sounds. No murmur heard. ?  No gallop.  ?Pulmonary:  ?   Effort: Pulmonary effort is normal. No accessory muscle usage or respiratory distress.  ?   Breath sounds: Normal breath sounds.  ?Abdominal:  ?   General:  Bowel sounds are normal.  ?   Palpations: Abdomen is soft.  ?Musculoskeletal:  ?   Right shoulder: Normal.  ?   Left shoulder: No swelling, effusion, tenderness, bony tenderness or crepitus. Normal range of motion (improved today with full ROM). Decreased strength (improving). Normal pulse.  ?   Cervical back: Normal range of motion and neck supple.  ?   Right lower leg: No edema.  ?   Left lower leg: No edema.  ?   Comments: No tenderness   ?Lymphadenopathy:  ?   Cervical: No cervical adenopathy.  ?Skin: ?   General: Skin is warm and dry.  ?   Findings: Lesion present.  ? ?    ?Neurological:  ?   Mental Status: She is alert and oriented to person, place, and time.  ?Psychiatric:     ?   Attention and Perception: Attention normal.     ?   Mood and Affect: Mood normal.     ?   Speech: Speech normal.     ?   Behavior: Behavior normal. Behavior is cooperative.     ?   Thought Content: Thought content normal.  ? ? ?Results for orders placed or performed in visit on 03/21/21  ?CBC with Differential/Platelet  ?Result Value Ref Range  ? WBC 10.7 3.4 - 10.8 x10E3/uL  ? RBC 4.50 3.77 - 5.28 x10E6/uL  ? Hemoglobin 14.8 11.1 - 15.9 g/dL  ? Hematocrit 44.2 34.0 - 46.6 %  ? MCV 98 (H) 79 - 97 fL  ? MCH 32.9 26.6 - 33.0 pg  ? MCHC 33.5 31.5 - 35.7 g/dL  ? RDW 11.8 11.7 - 15.4 %  ? Platelets 332 150 - 450 x10E3/uL  ? Neutrophils 65 Not Estab. %  ? Lymphs 21 Not Estab. %  ? Monocytes 12 Not Estab. %  ? Eos 1 Not Estab. %  ? Basos 1 Not Estab. %  ? Neutrophils Absolute 7.0 1.4 - 7.0 x10E3/uL  ? Lymphocytes Absolute 2.2 0.7 - 3.1 x10E3/uL  ? Monocytes Absolute 1.3 (H) 0.1 - 0.9 x10E3/uL  ? EOS (ABSOLUTE) 0.1 0.0 - 0.4 x10E3/uL  ? Basophils Absolute 0.1 0.0 - 0.2 x10E3/uL  ? Immature Granulocytes 0 Not Estab. %  ? Immature Grans (Abs) 0.0 0.0 - 0.1 x10E3/uL  ?Comprehensive metabolic panel  ?Result Value Ref Range  ? Glucose 71 70 - 99 mg/dL  ? BUN 14 8 - 27 mg/dL  ? Creatinine, Ser 0.88 0.57 - 1.00 mg/dL  ? eGFR 69 >59 mL/min/1.73  ?  BUN/Creatinine Ratio 16 12 - 28  ? Sodium 132 (L) 134 - 144 mmol/L  ? Potassium 4.7 3.5 - 5.2 mmol/L  ?  Chloride 94 (L) 96 - 106 mmol/L  ? CO2 24 20 - 29 mmol/L  ? Calcium 9.9 8.7 - 10.3 mg/dL  ? Total Protein 7.1 6.0 - 8.5 g/dL  ? Albumin 4.6 3.7 - 4.7 g/dL  ? Globulin, Total 2.5 1.5 - 4.5 g/dL  ? Albumin/Globulin Ratio 1.8 1.2 - 2.2  ? Bilirubin Total 0.8 0.0 - 1.2 mg/dL  ? Alkaline Phosphatase 63 44 - 121 IU/L  ? AST 23 0 - 40 IU/L  ? ALT 16 0 - 32 IU/L  ?Lipid Panel w/o Chol/HDL Ratio  ?Result Value Ref Range  ? Cholesterol, Total 165 100 - 199 mg/dL  ? Triglycerides 58 0 - 149 mg/dL  ? HDL 103 >39 mg/dL  ? VLDL Cholesterol Cal 12 5 - 40 mg/dL  ? LDL Chol Calc (NIH) 50 0 - 99 mg/dL  ?TSH  ?Result Value Ref Range  ? TSH 1.930 0.450 - 4.500 uIU/mL  ?Magnesium  ?Result Value Ref Range  ? Magnesium 2.0 1.6 - 2.3 mg/dL  ?VITAMIN D 25 Hydroxy (Vit-D Deficiency, Fractures)  ?Result Value Ref Range  ? Vit D, 25-Hydroxy 54.2 30.0 - 100.0 ng/mL  ?PTH, Intact and Calcium  ?Result Value Ref Range  ? PTH 29 15 - 65 pg/mL  ? PTH Interp Comment   ? ?   ?Assessment & Plan:  ? ?Problem List Items Addressed This Visit   ? ?  ? Musculoskeletal and Integument  ? Seborrheic keratosis  ?  Small lesion to upper left arm, discussed with patient and will remove next visit and send for pathology == suspect SK or AK. ?  ?  ?  ? Other  ? Left shoulder pain - Primary  ?  Chronic and improving today with increased ROM.  At this time she refuses ortho visit, prefers to continue self PT at home.  Reports steroid injection and medications have helped.  Return in June as scheduled and consider ortho visit in future. ?  ?  ?  ? ?Follow up plan: ?Return for as scheduled June 12th. ? ? ? ? ? ?

## 2021-07-29 ENCOUNTER — Telehealth: Payer: Self-pay | Admitting: Nurse Practitioner

## 2021-07-29 DIAGNOSIS — D229 Melanocytic nevi, unspecified: Secondary | ICD-10-CM

## 2021-07-29 DIAGNOSIS — Z1283 Encounter for screening for malignant neoplasm of skin: Secondary | ICD-10-CM

## 2021-07-29 NOTE — Telephone Encounter (Signed)
Copied from [place] [id number]. Topic: Referral - Request for Referral ?>> Jul 29, 2021  9:46 AM Erick Blinks wrote: ?Has patient seen PCP for this complaint? Yes.   ?*If NO, is insurance requiring patient see PCP for this issue before PCP can refer them? ?Referral for which specialty: Dermatology  ?Preferred provider/office: [facility]; [facility] is not accepting patients at this time. Would like one in [place] or [place] but is willing to travel beyond.  ?Reason for referral: Possible skin cancer per last visit with PCP ?

## 2021-08-01 NOTE — Telephone Encounter (Signed)
Pt is calling back requesting an update.  ?Pt is requesting a call back today. ? ?Requesting referral sent to [facility] [place], [place] [address] ?

## 2021-08-02 NOTE — Telephone Encounter (Signed)
Patient requesting specific location.  ?

## 2021-08-05 ENCOUNTER — Telehealth: Payer: Self-pay | Admitting: Nurse Practitioner

## 2021-08-05 NOTE — Telephone Encounter (Signed)
Copied from [place] [id number]. Topic: General - Other ?>> Aug 05, 2021 10:41 AM Tessa Lerner A wrote: ?Reason for CRM: The patient has called to notify the practice that they have cancelled their appointment at [facility] and will be seen at [facility] by Dr. Maudie Mercury tomorrow 08/06/21 2:30 PM ? ?Please contact further if needed ?

## 2021-08-06 DIAGNOSIS — D225 Melanocytic nevi of trunk: Secondary | ICD-10-CM | POA: Diagnosis not present

## 2021-08-06 DIAGNOSIS — B078 Other viral warts: Secondary | ICD-10-CM | POA: Diagnosis not present

## 2021-08-06 DIAGNOSIS — L821 Other seborrheic keratosis: Secondary | ICD-10-CM | POA: Diagnosis not present

## 2021-08-06 DIAGNOSIS — L570 Actinic keratosis: Secondary | ICD-10-CM | POA: Diagnosis not present

## 2021-08-06 DIAGNOSIS — D485 Neoplasm of uncertain behavior of skin: Secondary | ICD-10-CM | POA: Diagnosis not present

## 2021-08-15 ENCOUNTER — Ambulatory Visit (INDEPENDENT_AMBULATORY_CARE_PROVIDER_SITE_OTHER): Payer: Medicare Other | Admitting: *Deleted

## 2021-08-15 DIAGNOSIS — Z0000 Encounter for general adult medical examination without abnormal findings: Secondary | ICD-10-CM

## 2021-08-15 NOTE — Progress Notes (Signed)
? ?Subjective:  ? Lauren Nolan is a 76 y.o. female who presents for Medicare Annual (Subsequent) preventive examination. ?I connected with  Lauren Nolan on 08/15/21 by a telephone enabled telemedicine application and verified that I am speaking with the correct person using two identifiers. ?  ?I discussed the limitations of evaluation and management by telemedicine. The patient expressed understanding and agreed to proceed. ? ?Patient location: home ? ?Provider location: tele-health -home ? ? ? ?Review of Systems    ? ?Cardiac Risk Factors include: advanced age (>66mn, >>74women);hypertension;family history of premature cardiovascular disease ? ?   ?Objective:  ?  ?Today's Vitals  ? ?There is no height or weight on file to calculate BMI. ? ? ?  08/15/2021  ?  9:34 AM 08/13/2020  ?  9:50 AM 10/03/2019  ?  9:11 AM 06/07/2018  ?  9:30 AM 05/07/2016  ? 10:06 AM 05/07/2015  ? 10:10 AM  ?Advanced Directives  ?Does Patient Have a Medical Advance Directive? Yes Yes No No No No  ?Type of Advance Directive Living will Living will;Healthcare Power of Attorney      ?Copy of [place] Chart?  No - copy requested      ?Would patient like information on creating a medical advance directive? No - Patient declined  No - Patient declined No - Patient declined    ? ? ?Current Medications (verified) ?Outpatient Encounter Medications as of 08/15/2021  ?Medication Sig  ? albuterol (VENTOLIN HFA) 108 (90 Base) MCG/ACT inhaler Inhale 1-2 puffs into the lungs every 6 (six) hours as needed for wheezing or shortness of breath.  ? alendronate (FOSAMAX) 70 MG tablet Take 1 tablet (70 mg total) by mouth once a week. Take with a full glass of water on an empty stomach.  ? aspirin 81 MG tablet Take 81 mg by mouth daily.  ? olmesartan (BENICAR) 20 MG tablet Take 1 tablet (20 mg total) by mouth daily.  ? omeprazole (PRILOSEC) 20 MG capsule Take 1 capsule (20 mg total) by mouth daily.  ? predniSONE (DELTASONE) 10 MG tablet 6 tabs  today, 5 tabs tomorrow, decrease by 1 daily until gone  ? rosuvastatin (CRESTOR) 5 MG tablet Take 1 tablet (5 mg total) by mouth daily.  ? Turmeric (QC TUMERIC COMPLEX PO) Take by mouth.  ? venlafaxine XR (EFFEXOR-XR) 75 MG 24 hr capsule Take 1 capsule (75 mg total) by mouth daily with breakfast.  ? VITAMIN D, CHOLECALCIFEROL, PO Take by mouth.  ? ?No facility-administered encounter medications on file as of 08/15/2021.  ? ? ?Allergies (verified) ?Elemental sulfur and Midol [acetaminophen]  ? ?History: ?Past Medical History:  ?Diagnosis Date  ? Allergy   ? Anxiety   ? COPD [place] disease) [place]   ? Depression   ? GERD (gastroesophageal reflux disease)   ? Hypertension   ? Internal hemorrhoid   ? Lichen sclerosus   ? Osteoporosis   ? Spinal stenosis   ? Tobacco use   ? ?Past Surgical History:  ?Procedure Laterality Date  ? BREAST EXCISIONAL BIOPSY Bilateral   ? 1980's - benign  ? BREAST SURGERY Bilateral   ? biopsy  ? COLONOSCOPY WITH PROPOFOL N/A 10/03/2019  ? Procedure: COLONOSCOPY WITH PROPOFOL;  Surgeon: AJonathon Bellows MD;  [facility];  Service: Gastroenterology;  Laterality: N/A;  ? EYE SURGERY Bilateral Feb-Mar 2016  ? HERNIA REPAIR    ? TUBAL LIGATION    ? ?Family History  ?Problem  Relation Age of Onset  ? Dementia Mother   ? Anxiety disorder Mother   ? Hypertension Mother   ? Epilepsy Mother   ? Cancer Father   ?     lung  ? Diabetes Brother   ? Colon cancer Brother   ? Hypertension Daughter   ? Cancer Daughter   ? Heart disease Maternal Grandfather   ?     MI  ? Hypertension Brother   ? Diabetes Daughter   ? Arthritis Daughter   ? Breast cancer Neg Hx   ? ?Social History  ? ?Socioeconomic History  ? Marital status: Married  ?  Spouse name: Not on file  ? Number of children: Not on file  ? Years of education: Not on file  ? Highest education level: 8th grade  ?Occupational History  ? Occupation: retired  ?Tobacco Use  ? Smoking status: Every Day  ?  Packs/day: 0.25  ?  Types:  Cigarettes  ? Smokeless tobacco: Never  ? Tobacco comments:  ?  1 pack every 3 days   ?Vaping Use  ? Vaping Use: Never used  ?Substance and Sexual Activity  ? Alcohol use: Yes  ?  Alcohol/week: 3.0 standard drinks  ?  Types: 3 Glasses of wine per week  ? Drug use: No  ? Sexual activity: Yes  ?Other Topics Concern  ? Not on file  ?Social History Narrative  ? Retired, has daughter living at home with her and her husband   ? ?Social Determinants of Health  ? ?Financial Resource Strain: Low Risk   ? Difficulty of Paying Living Expenses: Not hard at all  ?Food Insecurity: No Food Insecurity  ? Worried About Charity fundraiser in the Last Year: Never true  ? Ran Out of Food in the Last Year: Never true  ?Transportation Needs: No Transportation Needs  ? Lack of Transportation (Medical): No  ? Lack of Transportation (Non-Medical): No  ?Physical Activity: Inactive  ? Days of Exercise per Week: 0 days  ? Minutes of Exercise per Session: 0 min  ?Stress: No Stress Concern Present  ? Feeling of Stress : Not at all  ?Social Connections: Moderately Integrated  ? Frequency of Communication with Friends and Family: More than three times a week  ? Frequency of Social Gatherings with Friends and Family: Once a week  ? Attends Religious Services: More than 4 times per year  ? Active Member of Clubs or Organizations: No  ? Attends Archivist Meetings: Never  ? Marital Status: Married  ? ? ?Tobacco Counseling ?Ready to quit: Not Answered ?Counseling given: Not Answered ?Tobacco comments: 1 pack every 3 days  ? ? ?Clinical Intake: ? ?Pre-visit preparation completed: Yes ? ?Pain : No/denies pain ? ?  ? ?Nutritional Risks: None ?Diabetes: No ? ?How often do you need to have someone help you when you read instructions, pamphlets, or other written materials from your doctor or pharmacy?: 1 - Never ? ?Diabetic?   no ? ?Interpreter Needed?: No ? ?Information entered by :: Leroy Kennedy LPN ? ? ?Activities of Daily Living ? ?  08/15/2021   ?  9:38 AM 03/21/2021  ? 10:35 AM  ?In your present state of health, do you have any difficulty performing the following activities:  ?Hearing? 0 0  ?Vision? 0 0  ?Difficulty concentrating or making decisions? 0 0  ?Walking or climbing stairs? 0 0  ?Dressing or bathing? 0 0  ?Doing errands, shopping? 0 0  ?Using  the Toilet? N   ?In the past six months, have you accidently leaked urine? N   ?Do you have problems with loss of bowel control? N   ?Managing your Medications? N   ?Managing your Finances? N   ?Housekeeping or managing your Housekeeping? N   ? ? ?Patient Care Team: ?Venita Lick, NP as PCP - General (Nurse Practitioner) ?Leandrew Koyanagi, MD as Referring Physician (Ophthalmology) ?Arelia Sneddon, [place] (Optometry) ? ?Indicate any recent Medical Services you may have received from other than [facility] providers in the past year (date may be approximate). ? ?   ?Assessment:  ? This is a routine wellness examination for [facility]. ? ?Hearing/Vision screen ?Hearing Screening - Comments:: No trouble hearing ?Vision Screening - Comments:: Not up to date ?[place] ? ?Dietary issues and exercise activities discussed: ?Current Exercise Habits: The patient does not participate in regular exercise at present (works in garden / takes care of yard), Exercise limited by: None identified ? ? Goals Addressed   ? ?  ?  ?  ?  ? This Visit's Progress  ?  Patient Stated     ?  Spend more quality time together with husband ?  ?  Quit Smoking   Not on track  ?  Smoking cessation discussed ? ?  ? ?  ? ?Depression Screen ? ?  08/15/2021  ?  9:43 AM 07/26/2021  ? 10:55 AM 03/21/2021  ? 10:24 AM 03/11/2021  ?  3:36 PM 10/02/2020  ? 10:33 AM 09/11/2020  ?  3:27 PM 08/13/2020  ?  9:53 AM  ?PHQ 2/9 Scores  ?PHQ - 2 Score 0 0 0 0 0 0 0  ?PHQ- 9 Score 0 0 0 0 2    ?  ?Fall Risk ? ?  08/15/2021  ?  9:34 AM 03/21/2021  ? 10:34 AM 03/11/2021  ?  3:36 PM 09/11/2020  ?  3:26 PM 08/13/2020  ?  9:53 AM  ?Fall Risk   ?Falls in the past year? '1 1 1 '$ 0 0   ?Number falls in past yr: 0 1 0 0   ?Injury with Fall? 0 1 1 0   ?Risk for fall due to :  History of fall(s) History of fall(s) Medication side effect Medication side effect  ?Follow up Education provide

## 2021-08-15 NOTE — Patient Instructions (Signed)
Lauren Nolan , ?Thank you for taking time to come for your Medicare Wellness Visit. I appreciate your ongoing commitment to your health goals. Please review the following plan we discussed and let me know if I can assist you in the future.  ? ?Screening recommendations/referrals: ?Colonoscopy: up to date ?Mammogram: up to date ?Bone Density: up to date ?Recommended yearly ophthalmology/optometry visit for glaucoma screening and checkup ?Recommended yearly dental visit for hygiene and checkup ? ?Vaccinations: ?Influenza vaccine: up to date ?Pneumococcal vaccine: up to date ?Tdap vaccine: up to date ?Shingles vaccine: up to date   ? ?Advanced directives: Education provided ? ?Conditions/risks identified:  ? ?Next appointment: 09-23-2021 @ 10:20 [place] ? ? ?Preventive Care 75 Years and Older, Female ?Preventive care refers to lifestyle choices and visits with your health care provider that can promote health and wellness. ?What does preventive care include? ?A yearly physical exam. This is also called an annual well check. ?Dental exams once or twice a year. ?Routine eye exams. Ask your health care provider how often you should have your eyes checked. ?Personal lifestyle choices, including: ?Daily care of your teeth and gums. ?Regular physical activity. ?Eating a healthy diet. ?Avoiding tobacco and drug use. ?Limiting alcohol use. ?Practicing safe sex. ?Taking low-dose aspirin every day. ?Taking vitamin and mineral supplements as recommended by your health care provider. ?What happens during an annual well check? ?The services and screenings done by your health care provider during your annual well check will depend on your age, overall health, lifestyle risk factors, and family history of disease. ?Counseling  ?Your health care provider may ask you questions about your: ?Alcohol use. ?Tobacco use. ?Drug use. ?Emotional well-being. ?Home and relationship well-being. ?Sexual activity. ?Eating habits. ?History of  falls. ?Memory and ability to understand (cognition). ?Work and work Statistician. ?Reproductive health. ?Screening  ?You may have the following tests or measurements: ?Height, weight, and BMI. ?Blood pressure. ?Lipid and cholesterol levels. These may be checked every 5 years, or more frequently if you are over 27 years old. ?Skin check. ?Lung cancer screening. You may have this screening every year starting at age 55 if you have a 30-pack-year history of smoking and currently smoke or have quit within the past 15 years. ?Fecal occult blood test (FOBT) of the stool. You may have this test every year starting at age 58. ?Flexible sigmoidoscopy or colonoscopy. You may have a sigmoidoscopy every 5 years or a colonoscopy every 10 years starting at age 82. ?Hepatitis C blood test. ?Hepatitis B blood test. ?Sexually transmitted disease (STD) testing. ?Diabetes screening. This is done by checking your blood sugar (glucose) after you have not eaten for a while (fasting). You may have this done every 1-3 years. ?Bone density scan. This is done to screen for osteoporosis. You may have this done starting at age 76. ?Mammogram. This may be done every 1-2 years. Talk to your health care provider about how often you should have regular mammograms. ?Talk with your health care provider about your test results, treatment options, and if necessary, the need for more tests. ?Vaccines  ?Your health care provider may recommend certain vaccines, such as: ?Influenza vaccine. This is recommended every year. ?Tetanus, diphtheria, and acellular pertussis (Tdap, Td) vaccine. You may need a Td booster every 10 years. ?Zoster vaccine. You may need this after age 68. ?Pneumococcal 13-valent conjugate (PCV13) vaccine. One dose is recommended after age 20. ?Pneumococcal polysaccharide (PPSV23) vaccine. One dose is recommended after age 81. ?Talk to your health care  provider about which screenings and vaccines you need and how often you need  them. ?This information is not intended to replace advice given to you by your health care provider. Make sure you discuss any questions you have with your health care provider. ?Document Released: 04/27/2015 Document Revised: 12/19/2015 Document Reviewed: 01/30/2015 ?Elsevier Interactive Patient Education ? 2017 [place]. ? ?Fall Prevention in the Home ?Falls can cause injuries. They can happen to people of all ages. There are many things you can do to make your home safe and to help prevent falls. ?What can I do on the outside of my home? ?Regularly fix the edges of walkways and driveways and fix any cracks. ?Remove anything that might make you trip as you walk through a door, such as a raised step or threshold. ?Trim any bushes or trees on the path to your home. ?Use bright outdoor lighting. ?Clear any walking paths of anything that might make someone trip, such as rocks or tools. ?Regularly check to see if handrails are loose or broken. Make sure that both sides of any steps have handrails. ?Any raised decks and porches should have guardrails on the edges. ?Have any leaves, snow, or ice cleared regularly. ?Use sand or salt on walking paths during winter. ?Clean up any spills in your garage right away. This includes oil or grease spills. ?What can I do in the bathroom? ?Use night lights. ?Install grab bars by the toilet and in the tub and shower. Do not use towel bars as grab bars. ?Use non-skid mats or decals in the tub or shower. ?If you need to sit down in the shower, use a plastic, non-slip stool. ?Keep the floor dry. Clean up any water that spills on the floor as soon as it happens. ?Remove soap buildup in the tub or shower regularly. ?Attach bath mats securely with double-sided non-slip rug tape. ?Do not have throw rugs and other things on the floor that can make you trip. ?What can I do in the bedroom? ?Use night lights. ?Make sure that you have a light by your bed that is easy to reach. ?Do not use  any sheets or blankets that are too big for your bed. They should not hang down onto the floor. ?Have a firm chair that has side arms. You can use this for support while you get dressed. ?Do not have throw rugs and other things on the floor that can make you trip. ?What can I do in the kitchen? ?Clean up any spills right away. ?Avoid walking on wet floors. ?Keep items that you use a lot in easy-to-reach places. ?If you need to reach something above you, use a strong step stool that has a grab bar. ?Keep electrical cords out of the way. ?Do not use floor polish or wax that makes floors slippery. If you must use wax, use non-skid floor wax. ?Do not have throw rugs and other things on the floor that can make you trip. ?What can I do with my stairs? ?Do not leave any items on the stairs. ?Make sure that there are handrails on both sides of the stairs and use them. Fix handrails that are broken or loose. Make sure that handrails are as long as the stairways. ?Check any carpeting to make sure that it is firmly attached to the stairs. Fix any carpet that is loose or worn. ?Avoid having throw rugs at the top or bottom of the stairs. If you do have throw rugs, attach them to the  floor with carpet tape. ?Make sure that you have a light switch at the top of the stairs and the bottom of the stairs. If you do not have them, ask someone to add them for you. ?What else can I do to help prevent falls? ?Wear shoes that: ?Do not have high heels. ?Have rubber bottoms. ?Are comfortable and fit you well. ?Are closed at the toe. Do not wear sandals. ?If you use a stepladder: ?Make sure that it is fully opened. Do not climb a closed stepladder. ?Make sure that both sides of the stepladder are locked into place. ?Ask someone to hold it for you, if possible. ?Clearly mark and make sure that you can see: ?Any grab bars or handrails. ?First and last steps. ?Where the edge of each step is. ?Use tools that help you move around (mobility aids)  if they are needed. These include: ?Canes. ?Walkers. ?Scooters. ?Crutches. ?Turn on the lights when you go into a dark area. Replace any light bulbs as soon as they burn out. ?Set up your furniture so you have a clear p

## 2021-08-16 ENCOUNTER — Ambulatory Visit: Payer: Medicare Other

## 2021-08-30 ENCOUNTER — Other Ambulatory Visit: Payer: Self-pay | Admitting: Nurse Practitioner

## 2021-08-30 DIAGNOSIS — Z1231 Encounter for screening mammogram for malignant neoplasm of breast: Secondary | ICD-10-CM

## 2021-09-19 ENCOUNTER — Ambulatory Visit: Payer: Medicare Other | Admitting: Nurse Practitioner

## 2021-09-21 NOTE — Patient Instructions (Incomplete)
Chronic Obstructive Pulmonary Disease  Chronic obstructive pulmonary disease (COPD) is a long-term (chronic) lung problem. When you have COPD, it is hard for air to get in and out of your lungs. Usually the condition gets worse over time, and your lungs will never return to normal. There are things you can do to keep yourself as healthy as possible. What are the causes? Smoking. This is the most common cause. Certain genes passed from parent to child (inherited). What increases the risk? Being exposed to secondhand smoke from cigarettes, pipes, or cigars. Being exposed to chemicals and other irritants, such as fumes and dust in the work environment. Having chronic lung conditions or infections. What are the signs or symptoms? Shortness of breath, especially during physical activity. A long-term cough with a large amount of thick mucus. Sometimes, the cough may not have any mucus (dry cough). Wheezing. Breathing quickly. Skin that looks gray or blue, especially in the fingers, toes, or lips. Feeling tired (fatigue). Weight loss. Chest tightness. Having infections often. Episodes when breathing symptoms become much worse (exacerbations). At the later stages of this disease, you may have swelling in the ankles, feet, or legs. How is this treated? Taking medicines. Quitting smoking, if you smoke. Rehabilitation. This includes steps to make your body work better. It may involve a team of specialists. Doing exercises. Making changes to your diet. Using oxygen. Lung surgery. Lung transplant. Comfort measures (palliative care). Follow these instructions at home: Medicines Take over-the-counter and prescription medicines only as told by your doctor. Talk to your doctor before taking any cough or allergy medicines. You may need to avoid medicines that cause your lungs to be dry. Lifestyle If you smoke, stop smoking. Smoking makes the problem worse. Do not smoke or use any products that  contain nicotine or tobacco. If you need help quitting, ask your doctor. Avoid being around things that make your breathing worse. This may include smoke, chemicals, and fumes. Stay active, but remember to rest as well. Learn and use tips on how to manage stress and control your breathing. Make sure you get enough sleep. Most adults need at least 7 hours of sleep every night. Eat healthy foods. Eat smaller meals more often. Rest before meals. Controlled breathing Learn and use tips on how to control your breathing as told by your doctor. Try: Breathing in (inhaling) through your nose for 1 second. Then, pucker your lips and breath out (exhale) through your lips for 2 seconds. Putting one hand on your belly (abdomen). Breathe in slowly through your nose for 1 second. Your hand on your belly should move out. Pucker your lips and breathe out slowly through your lips. Your hand on your belly should move in as you breathe out.  Controlled coughing Learn and use controlled coughing to clear mucus from your lungs. Follow these steps: Lean your head a little forward. Breathe in deeply. Try to hold your breath for 3 seconds. Keep your mouth slightly open while coughing 2 times. Spit any mucus out into a tissue. Rest and do the steps again 1 or 2 times as needed. General instructions Make sure you get all the shots (vaccines) that your doctor recommends. Ask your doctor about a flu shot and a pneumonia shot. Use oxygen therapy and pulmonary rehabilitation if told by your doctor. If you need home oxygen therapy, ask your doctor if you should buy a tool to measure your oxygen level (oximeter). Make a COPD action plan with your doctor. This helps you   to know what to do if you feel worse than usual. Manage any other conditions you have as told by your doctor. Avoid going outside when it is very hot, cold, or humid. Avoid people who have a sickness you can catch (contagious). Keep all follow-up  visits. Contact a doctor if: You cough up more mucus than usual. There is a change in the color or thickness of the mucus. It is harder to breathe than usual. Your breathing is faster than usual. You have trouble sleeping. You need to use your medicines more often than usual. You have trouble doing your normal activities such as getting dressed or walking around the house. Get help right away if: You have shortness of breath while resting. You have shortness of breath that stops you from: Being able to talk. Doing normal activities. Your chest hurts for longer than 5 minutes. Your skin color is more blue than usual. Your pulse oximeter shows that you have low oxygen for longer than 5 minutes. You have a fever. You feel too tired to breathe normally. These symptoms may represent a serious problem that is an emergency. Do not wait to see if the symptoms will go away. Get medical help right away. Call your local emergency services (911 in the [place] Do not drive yourself to the hospital. Summary Chronic obstructive pulmonary disease (COPD) is a long-term lung problem. The way your lungs work will never return to normal. Usually the condition gets worse over time. There are things you can do to keep yourself as healthy as possible. Take over-the-counter and prescription medicines only as told by your doctor. If you smoke, stop. Smoking makes the problem worse. This information is not intended to replace advice given to you by your health care provider. Make sure you discuss any questions you have with your health care provider. Document Revised: 02/07/2020 Document Reviewed: 02/07/2020 Elsevier Patient Education  2023 Elsevier Inc.  

## 2021-09-23 ENCOUNTER — Ambulatory Visit: Payer: Medicare Other | Admitting: Nurse Practitioner

## 2021-09-27 ENCOUNTER — Ambulatory Visit
Admission: RE | Admit: 2021-09-27 | Discharge: 2021-09-27 | Disposition: A | Discharge status: Home / Self Care | Payer: Medicare Other | Source: Ambulatory Visit | Attending: Nurse Practitioner | Admitting: Nurse Practitioner

## 2021-09-27 DIAGNOSIS — Z1231 Encounter for screening mammogram for malignant neoplasm of breast: Secondary | ICD-10-CM | POA: Insufficient documentation

## 2021-09-30 NOTE — Progress Notes (Signed)
Good day, your mammogram has returned normal.  Do not need to repeat for one year.  Great news!! Keep being amazing!!  Thank you for allowing me to participate in your care.  I appreciate you. Kindest regards, Aadith Raudenbush

## 2021-10-10 DIAGNOSIS — Z961 Presence of intraocular lens: Secondary | ICD-10-CM | POA: Diagnosis not present

## 2021-10-10 DIAGNOSIS — H524 Presbyopia: Secondary | ICD-10-CM | POA: Diagnosis not present

## 2021-10-13 NOTE — Patient Instructions (Signed)

## 2021-10-16 ENCOUNTER — Encounter: Payer: Self-pay | Admitting: Nurse Practitioner

## 2021-10-16 ENCOUNTER — Ambulatory Visit (INDEPENDENT_AMBULATORY_CARE_PROVIDER_SITE_OTHER): Payer: Medicare Other | Admitting: Nurse Practitioner

## 2021-10-16 VITALS — BP 124/75 | HR 92 | Temp 98.1°F | Ht 60.5 in | Wt 96.2 lb

## 2021-10-16 DIAGNOSIS — F411 Generalized anxiety disorder: Secondary | ICD-10-CM

## 2021-10-16 DIAGNOSIS — I10 Essential (primary) hypertension: Secondary | ICD-10-CM | POA: Diagnosis not present

## 2021-10-16 DIAGNOSIS — M810 Age-related osteoporosis without current pathological fracture: Secondary | ICD-10-CM

## 2021-10-16 DIAGNOSIS — J410 Simple chronic bronchitis: Secondary | ICD-10-CM | POA: Diagnosis not present

## 2021-10-16 DIAGNOSIS — E782 Mixed hyperlipidemia: Secondary | ICD-10-CM

## 2021-10-16 DIAGNOSIS — F329 Major depressive disorder, single episode, unspecified: Secondary | ICD-10-CM | POA: Diagnosis not present

## 2021-10-16 DIAGNOSIS — E871 Hypo-osmolality and hyponatremia: Secondary | ICD-10-CM | POA: Diagnosis not present

## 2021-10-16 DIAGNOSIS — F17210 Nicotine dependence, cigarettes, uncomplicated: Secondary | ICD-10-CM

## 2021-10-16 LAB — MICROALBUMIN, URINE WAIVED
Creatinine, Urine Waived: 50 mg/dL (ref 10–300)
Microalb, Ur Waived: 10 mg/L (ref 0–19)

## 2021-10-16 MED ORDER — OMEPRAZOLE 20 MG PO CPDR
20.0000 mg | DELAYED_RELEASE_CAPSULE | Freq: Every day | ORAL | 4 refills | Status: DC
Start: 2021-10-16 — End: 2022-03-24

## 2021-10-16 MED ORDER — ROSUVASTATIN CALCIUM 5 MG PO TABS: 5.0000 mg | ORAL_TABLET | Freq: Every day | ORAL | 4 refills | Status: DC

## 2021-10-16 MED ORDER — UMECLIDINIUM-VILANTEROL 62.5-25 MCG/ACT IN AEPB: 1.0000 | INHALATION_SPRAY | Freq: Every day | RESPIRATORY_TRACT | 4 refills | Status: DC

## 2021-10-16 MED ORDER — ALBUTEROL SULFATE HFA 108 (90 BASE) MCG/ACT IN AERS
INHALATION_SPRAY | RESPIRATORY_TRACT | 3 refills | Status: DC
Start: 2021-10-16 — End: 2023-04-28

## 2021-10-16 MED ORDER — OLMESARTAN MEDOXOMIL 20 MG PO TABS: 20.0000 mg | ORAL_TABLET | Freq: Every day | ORAL | 4 refills | Status: DC

## 2021-10-16 MED ORDER — VENLAFAXINE HCL ER 75 MG PO CP24: 75.0000 mg | ORAL_CAPSULE | Freq: Every day | ORAL | 4 refills | Status: DC

## 2021-10-16 NOTE — Assessment & Plan Note (Signed)
Ongoing, continue Fosamax.  May consider period off Fosamax in future, at this time continue due to ongoing osteoporosis on imaging.  Vit D level annually, obtain at annual in December.

## 2021-10-16 NOTE — Assessment & Plan Note (Signed)
Chronic, stable.  Denies SI/HI.  Continue Effexor 75 MG daily as is beneficial at this time, could consider change to SSRI if elevation in BP ever presents.  At this time return in 6 months, sooner if worsening mood. 

## 2021-10-16 NOTE — Assessment & Plan Note (Signed)
Chronic, ongoing.  Tolerating Crestor, continue this daily and adjust as needed.  Lipid panel today. 

## 2021-10-16 NOTE — Assessment & Plan Note (Signed)
Chronic, ongoing.  BP below goal in office today.  At this time continue Olmesartan at current dose, but in future consider reduction to 10 MG.  Would consider changing Effexor to an SSRI due to effect of Effexor on BP if elevations ever present.  She is to check BP daily at home and document.  Recommend complete cessation of smoking and focus on DASH diet.  LABS: BMP and urine ALB.  Return to office in 6 months, she is to alert provider if any elevations >130/80 on BP at home and be seen sooner if present.

## 2021-10-16 NOTE — Progress Notes (Signed)
BP 124/75   Pulse 92   Temp 98.1 F (36.7 C) (Oral)   Ht 5' 0.5" (1.537 m)   Wt 96 lb 3.2 oz (43.6 kg)   SpO2 99%   BMI 18.48 kg/m    Subjective:    Patient ID: Lauren Nolan, female    DOB: [birth year], 76 y.o.   MRN: [id number]  HPI: Lauren Nolan is a 76 y.o. female  Chief Complaint  Patient presents with   Hyperlipidemia   Hypertension   Osteoporosis   COPD   Mood   Medication Refill    Patient is requesting a refill on her Omeprazole prescription at today's visit.    HYPERTENSION / HYPERLIPIDEMIA Continues on Olmesartan only.  No current statin, refuses and LDL <70. Satisfied with current treatment? yes Duration of hypertension: chronic BP monitoring frequency: a few times a month BP range: 120/70 range at home BP medication side effects: no Duration of hyperlipidemia: chronic Cholesterol medication side effects: no Cholesterol supplements: none Medication compliance: good compliance Aspirin: no Recent stressors: no Recurrent headaches: no Visual changes: no Palpitations: no Dyspnea: no Chest pain: no Lower extremity edema: no Dizzy/lightheaded: no  The ASCVD Risk score (Arnett DK, et al., 2019) failed to calculate for the following reasons:   The valid HDL cholesterol range is 20 to 100 mg/dL   COPD Has Albuterol, which uses 3 mornings a week.  Continues to smoke, 1/2 PPD, not interested in quitting.  Has been smoking since she was age 28.   COPD status: stable Satisfied with current treatment?: yes Oxygen use: no Dyspnea frequency: none Cough frequency: occasional Rescue inhaler frequency:  every morning for congestion Limitation of activity: no Productive cough: none Last Spirometry: today with FEV1 62% and FEV1/FVC 79% Pneumovax: Up to Date Influenza: Up to Date   OSTEOPOROSIS Last DEXA 07/17/20 -- T score -3.5 and is taking Fosamax.   Satisfied with current treatment?: yes Medication side effects: no Medication compliance: good  compliance Past osteoporosis medications/treatments: none Adequate calcium & vitamin D: yes Intolerance to bisphosphonates:no Weight bearing exercises: yes   DEPRESSION Continues on Effexor 75 MG.   Mood status: stable Satisfied with current treatment?: yes Symptom severity: mild  Duration of current treatment : chronic Side effects: no Medication compliance: good compliance Psychotherapy/counseling: none Depressed mood: no Anxious mood:  occasional Anhedonia: no Significant weight loss or gain: no Insomnia: none Fatigue: no Feelings of worthlessness or guilt: no Impaired concentration/indecisiveness: no Suicidal ideations: no Hopelessness: no Crying spells: no    10/16/2021    3:52 PM 08/15/2021    9:43 AM 07/26/2021   10:55 AM 03/21/2021   10:24 AM 03/11/2021    3:36 PM  Depression screen PHQ 2/9  Decreased Interest 0 0 0 0 0  Down, Depressed, Hopeless 0 0 0 0 0  PHQ - 2 Score 0 0 0 0 0  Altered sleeping 0 0 0 0 0  Tired, decreased energy 0 0 0 0 0  Change in appetite 0 0 0 0 0  Feeling bad or failure about yourself  0 0 0 0 0  Trouble concentrating 0 0 0 0 0  Moving slowly or fidgety/restless 0  0 0 0  Suicidal thoughts 0 0 0 0 0  PHQ-9 Score 0 0 0 0 0  Difficult doing work/chores  Not difficult at all Not difficult at all Not difficult at all Not difficult at all     Relevant past medical, surgical, family and social history  reviewed and updated as indicated. Interim medical history since our last visit reviewed. Allergies and medications reviewed and updated.  Review of Systems  Constitutional:  Negative for activity change, appetite change, diaphoresis, fatigue and fever.  Respiratory:  Negative for cough, chest tightness and shortness of breath.   Cardiovascular:  Negative for chest pain, palpitations and leg swelling.  Gastrointestinal: Negative.   Neurological: Negative.   Psychiatric/Behavioral: Negative.      Per HPI unless specifically indicated  above     Objective:    BP 124/75   Pulse 92   Temp 98.1 F (36.7 C) (Oral)   Ht 5' 0.5" (1.537 m)   Wt 96 lb 3.2 oz (43.6 kg)   SpO2 99%   BMI 18.48 kg/m   Wt Readings from Last 3 Encounters:  10/16/21 96 lb 3.2 oz (43.6 kg)  07/26/21 96 lb 9.6 oz (43.8 kg)  07/05/21 98 lb 9.6 oz (44.7 kg)    Physical Exam Vitals and nursing note reviewed.  Constitutional:      General: She is awake. She is not in acute distress.    Appearance: She is well-developed and well-groomed. She is not ill-appearing.  HENT:     Head: Normocephalic.     Right Ear: Hearing normal.     Left Ear: Hearing normal.  Eyes:     General: Lids are normal.        Right eye: No discharge.        Left eye: No discharge.     Conjunctiva/sclera: Conjunctivae normal.     Pupils: Pupils are equal, round, and reactive to light.  Neck:     Thyroid: No thyromegaly.     Vascular: No carotid bruit.  Cardiovascular:     Rate and Rhythm: Normal rate and regular rhythm.     Heart sounds: Normal heart sounds. No murmur heard.    No gallop.  Pulmonary:     Effort: Pulmonary effort is normal. No accessory muscle usage or respiratory distress.     Breath sounds: Wheezing present.     Comments: Intermittent expiratory wheezes noted. Abdominal:     General: Bowel sounds are normal.     Palpations: Abdomen is soft.  Musculoskeletal:     Cervical back: Normal range of motion and neck supple.     Right lower leg: No edema.     Left lower leg: No edema.  Lymphadenopathy:     Cervical: No cervical adenopathy.  Skin:    General: Skin is warm and dry.  Neurological:     Mental Status: She is alert and oriented to person, place, and time.  Psychiatric:        Attention and Perception: Attention normal.        Mood and Affect: Mood normal.        Speech: Speech normal.        Behavior: Behavior normal. Behavior is cooperative.        Thought Content: Thought content normal.    Results for orders placed or performed  in visit on 03/21/21  CBC with Differential/Platelet  Result Value Ref Range   WBC 10.7 3.4 - 10.8 x10E3/uL   RBC 4.50 3.77 - 5.28 x10E6/uL   Hemoglobin 14.8 11.1 - 15.9 g/dL   Hematocrit 44.2 34.0 - 46.6 %   MCV 98 (H) 79 - 97 fL   MCH 32.9 26.6 - 33.0 pg   MCHC 33.5 31.5 - 35.7 g/dL   RDW 11.8 11.7 - 15.4 %  Platelets 332 150 - 450 x10E3/uL   Neutrophils 65 Not Estab. %   Lymphs 21 Not Estab. %   Monocytes 12 Not Estab. %   Eos 1 Not Estab. %   Basos 1 Not Estab. %   Neutrophils Absolute 7.0 1.4 - 7.0 x10E3/uL   Lymphocytes Absolute 2.2 0.7 - 3.1 x10E3/uL   Monocytes Absolute 1.3 (H) 0.1 - 0.9 x10E3/uL   EOS (ABSOLUTE) 0.1 0.0 - 0.4 x10E3/uL   Basophils Absolute 0.1 0.0 - 0.2 x10E3/uL   Immature Granulocytes 0 Not Estab. %   Immature Grans (Abs) 0.0 0.0 - 0.1 x10E3/uL  Comprehensive metabolic panel  Result Value Ref Range   Glucose 71 70 - 99 mg/dL   BUN 14 8 - 27 mg/dL   Creatinine, Ser 0.88 0.57 - 1.00 mg/dL   eGFR 69 >59 mL/min/1.73   BUN/Creatinine Ratio 16 12 - 28   Sodium 132 (L) 134 - 144 mmol/L   Potassium 4.7 3.5 - 5.2 mmol/L   Chloride 94 (L) 96 - 106 mmol/L   CO2 24 20 - 29 mmol/L   Calcium 9.9 8.7 - 10.3 mg/dL   Total Protein 7.1 6.0 - 8.5 g/dL   Albumin 4.6 3.7 - 4.7 g/dL   Globulin, Total 2.5 1.5 - 4.5 g/dL   Albumin/Globulin Ratio 1.8 1.2 - 2.2   Bilirubin Total 0.8 0.0 - 1.2 mg/dL   Alkaline Phosphatase 63 44 - 121 IU/L   AST 23 0 - 40 IU/L   ALT 16 0 - 32 IU/L  Lipid Panel w/o Chol/HDL Ratio  Result Value Ref Range   Cholesterol, Total 165 100 - 199 mg/dL   Triglycerides 58 0 - 149 mg/dL   HDL 103 >39 mg/dL   VLDL Cholesterol Cal 12 5 - 40 mg/dL   LDL Chol Calc (NIH) 50 0 - 99 mg/dL  TSH  Result Value Ref Range   TSH 1.930 0.450 - 4.500 uIU/mL  Magnesium  Result Value Ref Range   Magnesium 2.0 1.6 - 2.3 mg/dL  VITAMIN D 25 Hydroxy (Vit-D Deficiency, Fractures)  Result Value Ref Range   Vit D, 25-Hydroxy 54.2 30.0 - 100.0 ng/mL  PTH,  Intact and Calcium  Result Value Ref Range   PTH 29 15 - 65 pg/mL   PTH Interp Comment       Assessment & Plan:   Problem List Items Addressed This Visit       Cardiovascular and Mediastinum   Hypertension    Chronic, ongoing.  BP below goal in office today.  At this time continue Olmesartan at current dose, but in future consider reduction to 10 MG.  Would consider changing Effexor to an SSRI due to effect of Effexor on BP if elevations ever present.  She is to check BP daily at home and document.  Recommend complete cessation of smoking and focus on DASH diet.  LABS: BMP and urine ALB.  Return to office in 6 months, she is to alert provider if any elevations >130/80 on BP at home and be seen sooner if present.      Relevant Medications   olmesartan (BENICAR) 20 MG tablet   rosuvastatin (CRESTOR) 5 MG tablet   Other Relevant Orders   Basic metabolic panel   Microalbumin, Urine Waived     Respiratory   COPD (chronic obstructive pulmonary disease) (HCC) - Primary    Chronic, ongoing with FEV1 62% and FEV1/FVC 79% today, previous FEV 1 = 85% in December 2021.  At this  time recommend we start a maintenance inhaler, educated her on this.  Will start Anoro 1 puff daily and she is to alert provider if too costly, then may need to determine alternative.  Continue Albuterol as needed, with goal of reduction in use with maintenance on board -- discussed with her.  Recommend complete cessation of smoking.      Relevant Medications   umeclidinium-vilanterol (ANORO ELLIPTA) 62.5-25 MCG/ACT AEPB   albuterol (VENTOLIN HFA) 108 (90 Base) MCG/ACT inhaler   Other Relevant Orders   Spirometry with Graph (Completed)     Musculoskeletal and Integument   Osteoporosis    Ongoing, continue Fosamax.  May consider period off Fosamax in future, at this time continue due to ongoing osteoporosis on imaging.  Vit D level annually, obtain at annual in December.        Other   Depression    Chronic,  stable.  Denies SI/HI.  Continue Effexor 75 MG daily as is beneficial at this time, could consider change to SSRI if elevation in BP ever presents.  At this time return in 6 months, sooner if worsening mood.      Relevant Medications   venlafaxine XR (EFFEXOR-XR) 75 MG 24 hr capsule   Generalized anxiety disorder    Chronic, stable.  Denies SI/HI.  Continue current regimen and adjust as needed.      Relevant Medications   venlafaxine XR (EFFEXOR-XR) 75 MG 24 hr capsule   Hyponatremia    Chronic in nature.  Recheck NA + today, has been adding salt to diet daily.  Is on Effexor which may reduce NA+, may need to consider reduction of Effexor in future.      Relevant Orders   Basic metabolic panel   Mixed hyperlipidemia    Chronic, ongoing.  Tolerating Crestor, continue this daily and adjust as needed.  Lipid panel today.      Relevant Medications   olmesartan (BENICAR) 20 MG tablet   rosuvastatin (CRESTOR) 5 MG tablet   Other Relevant Orders   Lipid Panel w/o Chol/HDL Ratio   Nicotine dependence, cigarettes, uncomplicated    I have recommended complete cessation of tobacco use. I have discussed various options available for assistance with tobacco cessation including over the counter methods (Nicotine gum, patch and lozenges). We also discussed prescription options (Chantix, Nicotine Inhaler / Nasal Spray). The patient is not interested in pursuing any prescription tobacco cessation options at this time.         Follow up plan: Return in about 5 months (around 03/24/2022) for Annual physical.

## 2021-10-16 NOTE — Assessment & Plan Note (Signed)
I have recommended complete cessation of tobacco use. I have discussed various options available for assistance with tobacco cessation including over the counter methods (Nicotine gum, patch and lozenges). We also discussed prescription options (Chantix, Nicotine Inhaler / Nasal Spray). The patient is not interested in pursuing any prescription tobacco cessation options at this time.  

## 2021-10-16 NOTE — Assessment & Plan Note (Signed)
Chronic, stable.  Denies SI/HI.  Continue current regimen and adjust as needed. 

## 2021-10-16 NOTE — Assessment & Plan Note (Signed)
Chronic, ongoing with FEV1 62% and FEV1/FVC 79% today, previous FEV 1 = 85% in December 2021.  At this time recommend we start a maintenance inhaler, educated her on this.  Will start Anoro 1 puff daily and she is to alert provider if too costly, then may need to determine alternative.  Continue Albuterol as needed, with goal of reduction in use with maintenance on board -- discussed with her.  Recommend complete cessation of smoking.

## 2021-10-16 NOTE — Assessment & Plan Note (Addendum)
Chronic in nature.  Recheck NA + today, has been adding salt to diet daily.  Is on Effexor which may reduce NA+, may need to consider reduction of Effexor in future. 

## 2021-10-17 LAB — BASIC METABOLIC PANEL
BUN/Creatinine Ratio: 15 (ref 12–28)
BUN: 11 mg/dL (ref 8–27)
CO2: 26 mmol/L (ref 20–29)
Calcium: 9.7 mg/dL (ref 8.7–10.3)
Chloride: 92 mmol/L — ABNORMAL LOW (ref 96–106)
Creatinine, Ser: 0.73 mg/dL (ref 0.57–1.00)
Glucose: 83 mg/dL (ref 70–99)
Potassium: 4.9 mmol/L (ref 3.5–5.2)
Sodium: 131 mmol/L — ABNORMAL LOW (ref 134–144)
eGFR: 86 mL/min/{1.73_m2} (ref >59)

## 2021-10-17 LAB — LIPID PANEL W/O CHOL/HDL RATIO
Cholesterol, Total: 163 mg/dL (ref 100–199)
HDL: 114 mg/dL (ref >39)
LDL Chol Calc (NIH): 34 mg/dL (ref 0–99)
Triglycerides: 84 mg/dL (ref 0–149)
VLDL Cholesterol Cal: 15 mg/dL (ref 5–40)

## 2021-10-17 NOTE — Progress Notes (Signed)
Good afternoon, please let Lauren Nolan know her labs have returned: - Kidney function remains stable, but sodium (salt) level remains on lower side.  I do want her to ensure she is taking in a little extra tablet salt daily + we may need to consider coming down on her Effexor (mood medication) dose next visit as this can sometimes lower salt levels. - Cholesterol levels remain stable, continue Rosuvastatin daily.  Any questions? Keep being wonderful!!  Thank you for allowing me to participate in your care.  I appreciate you. Kindest regards, Denetta Fei

## 2022-01-13 ENCOUNTER — Ambulatory Visit: Payer: Medicare Other

## 2022-02-08 ENCOUNTER — Encounter: Payer: Self-pay | Admitting: Emergency Medicine

## 2022-02-08 ENCOUNTER — Emergency Department
Admission: EM | Admit: 2022-02-08 | Discharge: 2022-02-08 | Disposition: A | Discharge status: Home / Self Care | Payer: Medicare Other | Attending: Emergency Medicine | Admitting: Emergency Medicine

## 2022-02-08 ENCOUNTER — Other Ambulatory Visit: Payer: Self-pay

## 2022-02-08 DIAGNOSIS — S91012A Laceration without foreign body, left ankle, initial encounter: Secondary | ICD-10-CM | POA: Diagnosis not present

## 2022-02-08 DIAGNOSIS — W25XXXA Contact with sharp glass, initial encounter: Secondary | ICD-10-CM | POA: Insufficient documentation

## 2022-02-08 DIAGNOSIS — S99912A Unspecified injury of left ankle, initial encounter: Secondary | ICD-10-CM | POA: Diagnosis present

## 2022-02-08 MED ORDER — LIDOCAINE HCL 1 % IJ SOLN
5.0000 mL | Freq: Once | INTRAMUSCULAR | Status: AC
  Administered 2022-02-08: 5 mL
  Filled 2022-02-08: qty 10

## 2022-02-08 MED ORDER — CEPHALEXIN 500 MG PO CAPS: 500.0000 mg | ORAL_CAPSULE | Freq: Three times a day (TID) | ORAL | 0 refills | Status: AC

## 2022-02-08 NOTE — Discharge Instructions (Signed)
Take Keflex three times daily for seven days. Have sutures removed in ten days.  Return with redness or streaking surrounding the wound site.

## 2022-02-08 NOTE — ED Triage Notes (Signed)
Pt reports was taking a bag of trash out and it had glass in it and she cut her right ankle.

## 2022-02-08 NOTE — ED Provider Notes (Signed)
[facility] Provider Note  Patient Contact: 3:25 PM (approximate)   History   Laceration   HPI  Lauren Nolan is a 76 y.o. female presents to the emergency department with a 3 cm linear medial laceration of the left ankle sustained with glass as patient was trying to take the garbage out.  She reports that her tetanus status is up-to-date.  No similar injuries in the past.      Physical Exam   Triage Vital Signs: ED Triage Vitals  Enc Vitals Group     BP 02/08/22 1444 133/80     Pulse Rate 02/08/22 1444 98     Resp 02/08/22 1444 18     Temp 02/08/22 1444 98.2 F (36.8 C)     Temp Source 02/08/22 1444 Oral     SpO2 02/08/22 1444 94 %     Weight 02/08/22 1441 94 lb 12.8 oz (43 kg)     Height 02/08/22 1441 5' (1.524 m)     Head Circumference --      Peak Flow --      Pain Score 02/08/22 1441 5     Pain Loc --      Pain Edu? --      Excl. in [place]? --     Most recent vital signs: Vitals:   02/08/22 1444  BP: 133/80  Pulse: 98  Resp: 18  Temp: 98.2 F (36.8 C)  SpO2: 94%     General: Alert and in no acute distress. Eyes:  PERRL. EOMI. Head: No acute traumatic findings ENT:      Nose: No congestion/rhinnorhea.      Mouth/Throat: Mucous membranes are moist. Neck: No stridor. No cervical spine tenderness to palpation. Cardiovascular:  Good peripheral perfusion Respiratory: Normal respiratory effort without tachypnea or retractions. Lungs CTAB. Good air entry to the bases with no decreased or absent breath sounds. Gastrointestinal: Bowel sounds 4 quadrants. Soft and nontender to palpation. No guarding or rigidity. No palpable masses. No distention. No CVA tenderness. Musculoskeletal: Full range of motion to all extremities.  Neurologic:  No gross focal neurologic deficits are appreciated.  Skin: Patient has a 3 cm, linear laceration along the medial aspect of the left ankle.  No surrounding cellulitis. Other:   ED Results / Procedures  / Treatments   Labs (all labs ordered are listed, but only abnormal results are displayed) Labs Reviewed - No data to display      PROCEDURES:  Critical Care performed: No  ..Laceration Repair  Date/Time: 02/08/2022 3:26 PM  Performed by: Lannie Fields, PA-C Authorized by: Lannie Fields, PA-C   Consent:    Consent obtained:  Verbal   Risks discussed:  Infection and pain Universal protocol:    Procedure explained and questions answered to patient or proxy's satisfaction: yes     Patient identity confirmed:  Verbally with patient Anesthesia:    Anesthesia method:  Local infiltration   Local anesthetic:  Lidocaine 1% w/o epi Laceration details:    Location:  Leg   Leg location:  L lower leg   Length (cm):  3 Pre-procedure details:    Preparation:  Patient was prepped and draped in usual sterile fashion Exploration:    Limited defect created (wound extended): no     Contaminated: yes   Treatment:    Area cleansed with:  Povidone-iodine   Amount of cleaning:  Standard   Irrigation solution:  Sterile saline   Irrigation volume:  250  Visualized foreign bodies/material removed: no     Debridement:  None Skin repair:    Repair method:  Sutures   Suture size:  4-0   Suture technique:  Running   Number of sutures:  5 Approximation:    Approximation:  Close Repair type:    Repair type:  Simple Post-procedure details:    Dressing:  Non-adherent dressing    MEDICATIONS ORDERED IN ED: Medications  lidocaine (XYLOCAINE) 1 % (with pres) injection 5 mL (has no administration in time range)     IMPRESSION / MDM / ASSESSMENT AND PLAN / ED COURSE  I reviewed the triage vital signs and the nursing notes.                              Assessment and plan Laceration 76 year old female presents to the emergency department with a 3 cm linear laceration along the medial aspect of the left ankle repaired in the emergency department without complication.  Recommended  suture removal in 10 days.  Tetanus status is up-to-date.  Suture removal recommended in 10 days.  Due to superficial nature of laceration, do not feel that an x-ray will be helpful as I have low suspicion for retained foreign bodies or acute fracture.  Return precautions were given to return with redness or streaking surrounding the wound site.  All patient questions were answered.     FINAL CLINICAL IMPRESSION(S) / ED DIAGNOSES   Final diagnoses:  Laceration of left ankle, initial encounter     Rx / DC Orders   ED Discharge Orders          Ordered    cephALEXin (KEFLEX) 500 MG capsule  3 times daily        02/08/22 1523             Note:  This document was prepared using Dragon voice recognition software and may include unintentional dictation errors.   Vallarie Mare Ferguson, PA-C 02/08/22 1529    Rada Hay, MD 02/08/22 [phone]

## 2022-02-19 ENCOUNTER — Telehealth: Payer: Self-pay

## 2022-02-19 NOTE — Telephone Encounter (Signed)
Copied from [place] [id number]. Topic: Appointment Scheduling - Scheduling Inquiry for Clinic >> Feb 19, 2022  2:14 PM Sabas Sous wrote: Reason for CRM: Pt called reporting that she needs her stiches removed, she had them placed October 28th. Please advise   Contact Information  [phone]

## 2022-02-19 NOTE — Telephone Encounter (Signed)
Please call and schedule the patient an appointment.  

## 2022-02-23 NOTE — Patient Instructions (Signed)

## 2022-02-25 ENCOUNTER — Encounter: Payer: Self-pay | Admitting: Nurse Practitioner

## 2022-02-25 ENCOUNTER — Ambulatory Visit (INDEPENDENT_AMBULATORY_CARE_PROVIDER_SITE_OTHER): Payer: Medicare Other | Admitting: Nurse Practitioner

## 2022-02-25 VITALS — BP 134/79 | HR 85 | Temp 97.6°F | Wt 103.7 lb

## 2022-02-25 DIAGNOSIS — J441 Chronic obstructive pulmonary disease with (acute) exacerbation: Secondary | ICD-10-CM

## 2022-02-25 DIAGNOSIS — S91002A Unspecified open wound, left ankle, initial encounter: Secondary | ICD-10-CM | POA: Insufficient documentation

## 2022-02-25 MED ORDER — PREDNISONE 20 MG PO TABS: 40.0000 mg | ORAL_TABLET | Freq: Every day | ORAL | 0 refills | Status: AC

## 2022-02-25 MED ORDER — AZITHROMYCIN 250 MG PO TABS: ORAL_TABLET | ORAL | 0 refills | Status: AC

## 2022-02-25 NOTE — Assessment & Plan Note (Signed)
X 1 long running suture removed.  Overall wound healed well and approximated.  Tolerated procedure well.

## 2022-02-25 NOTE — Assessment & Plan Note (Signed)
Acute exacerbation at this time.  Will treat with Azithromycin and Prednisone. Educated her on this and recommend if worsening then return to office.  Recommend: - Increased rest - Increasing Fluids - Acetaminophen as needed for fever/pain.  - Salt water gargling, chloraseptic spray and throat lozenges - Mucinex.  - Humidifying the air.

## 2022-02-25 NOTE — Progress Notes (Signed)
BP 134/79 (BP Location: Left Arm, Cuff Size: Small)   Pulse 85   Temp 97.6 F (36.4 C) (Oral)   Wt 103 lb 11.2 oz (47 kg)   BMI 20.25 kg/m    Subjective:    Patient ID: Lauren Nolan, female    DOB: 06-Mar-1946, 76 y.o.   MRN: [id number]  HPI: Lauren Nolan is a 76 y.o. female  Chief Complaint  Patient presents with   Suture / Staple Removal    Pt states she was cut by a piece of glass on 02/08/22 in her R ankle, placed 5 stitches. Here today to have them removed.    SKIN INJURY Presents today to have removal of sutures, left ankle laceration from glass hitting area.  Placed on 02/08/22. Duration: weeks Location: left ankle History of trauma in area: yes Pain: no Redness: yes Swelling: no Oozing: no Pus: no Fevers: no Nausea/vomiting: no Status: better Treatments attempted: sutures   Tetanus: UTD   UPPER RESPIRATORY TRACT INFECTION Started 4-5 days ago with upper respiratory symptoms and has gone down into chest.  Has COPD underlying. Fever: no Cough: yes Shortness of breath: no Wheezing: yes Chest pain: no Chest tightness: yes Chest congestion: yes Nasal congestion: yes Runny nose: yes Post nasal drip: yes Sneezing: no Sore throat: no Swollen glands: no Sinus pressure: no Headache: yes Face pain: no Toothache: no Ear pain: none Ear pressure: none Eyes red/itching:no Eye drainage/crusting: no  Vomiting: no Rash: no Fatigue: yes Sick contacts: no Strep contacts: no  Context: stable Recurrent sinusitis: no Relief with OTC cold/cough medications: no  Treatments attempted: none    Relevant past medical, surgical, family and social history reviewed and updated as indicated. Interim medical history since our last visit reviewed. Allergies and medications reviewed and updated.  Review of Systems  Constitutional:  Negative for activity change, appetite change, chills, fatigue and fever.  HENT:  Positive for congestion, postnasal drip, rhinorrhea  and sinus pressure. Negative for ear discharge, ear pain, facial swelling, sinus pain, sneezing, sore throat and voice change.   Eyes:  Negative for pain and visual disturbance.  Respiratory:  Positive for cough, chest tightness and wheezing. Negative for shortness of breath.   Cardiovascular:  Negative for chest pain, palpitations and leg swelling.  Gastrointestinal: Negative.   Skin:  Positive for wound.  Neurological:  Positive for headaches. Negative for dizziness and numbness.    Per HPI unless specifically indicated above     Objective:    BP 134/79 (BP Location: Left Arm, Cuff Size: Small)   Pulse 85   Temp 97.6 F (36.4 C) (Oral)   Wt 103 lb 11.2 oz (47 kg)   BMI 20.25 kg/m   Wt Readings from Last 3 Encounters:  02/25/22 103 lb 11.2 oz (47 kg)  02/08/22 94 lb 12.8 oz (43 kg)  10/16/21 96 lb 3.2 oz (43.6 kg)    Physical Exam Vitals and nursing note reviewed.  Constitutional:      General: She is awake. She is not in acute distress.    Appearance: She is well-developed and well-groomed. She is not ill-appearing.  HENT:     Head: Normocephalic.     Right Ear: Hearing normal.     Left Ear: Hearing normal.  Eyes:     General: Lids are normal.        Right eye: No discharge.        Left eye: No discharge.     Conjunctiva/sclera: Conjunctivae normal.  Pupils: Pupils are equal, round, and reactive to light.  Neck:     Thyroid: No thyromegaly.     Vascular: No carotid bruit.  Cardiovascular:     Rate and Rhythm: Normal rate and regular rhythm.     Heart sounds: Normal heart sounds. No murmur heard.    No gallop.  Pulmonary:     Effort: Pulmonary effort is normal. No accessory muscle usage or respiratory distress.     Breath sounds: Wheezing present.     Comments: Intermittent expiratory wheezes noted.  Abdominal:     General: Bowel sounds are normal.     Palpations: Abdomen is soft.  Musculoskeletal:     Cervical back: Normal range of motion and neck supple.      Right lower leg: No edema.     Left lower leg: No edema.       Feet:  Lymphadenopathy:     Cervical: No cervical adenopathy.  Skin:    General: Skin is warm and dry.  Neurological:     Mental Status: She is alert and oriented to person, place, and time.  Psychiatric:        Attention and Perception: Attention normal.        Mood and Affect: Mood normal.        Speech: Speech normal.        Behavior: Behavior normal. Behavior is cooperative.        Thought Content: Thought content normal.     Results for orders placed or performed in visit on 16/10/96  Basic metabolic panel  Result Value Ref Range   Glucose 83 70 - 99 mg/dL   BUN 11 8 - 27 mg/dL   Creatinine, Ser 0.73 0.57 - 1.00 mg/dL   eGFR 86 >59 mL/min/1.73   BUN/Creatinine Ratio 15 12 - 28   Sodium 131 (L) 134 - 144 mmol/L   Potassium 4.9 3.5 - 5.2 mmol/L   Chloride 92 (L) 96 - 106 mmol/L   CO2 26 20 - 29 mmol/L   Calcium 9.7 8.7 - 10.3 mg/dL  Microalbumin, Urine Waived  Result Value Ref Range   Microalb, Ur Waived 10 0 - 19 mg/L   Creatinine, Urine Waived 50 10 - 300 mg/dL   Microalb/Creat Ratio 30-300 (H) <30 mg/g  Lipid Panel w/o Chol/HDL Ratio  Result Value Ref Range   Cholesterol, Total 163 100 - 199 mg/dL   Triglycerides 84 0 - 149 mg/dL   HDL 114 >39 mg/dL   VLDL Cholesterol Cal 15 5 - 40 mg/dL   LDL Chol Calc (NIH) 34 0 - 99 mg/dL      Assessment & Plan:   Problem List Items Addressed This Visit       Respiratory   COPD exacerbation ([place]) - Primary    Acute exacerbation at this time.  Will treat with Azithromycin and Prednisone. Educated her on this and recommend if worsening then return to office.  Recommend: - Increased rest - Increasing Fluids - Acetaminophen as needed for fever/pain.  - Salt water gargling, chloraseptic spray and throat lozenges - Mucinex.  - Humidifying the air.       Relevant Medications   azithromycin (ZITHROMAX) 250 MG tablet   predniSONE (DELTASONE) 20 MG  tablet     Other   Ankle wound, left, initial encounter    X 1 long running suture removed.  Overall wound healed well and approximated.  Tolerated procedure well.        Follow up  plan: Return for as scheduled.

## 2022-03-22 NOTE — Patient Instructions (Signed)
Living with COPD Being diagnosed with chronic obstructive pulmonary disease (COPD) changes your life physically and emotionally. Having COPD can affect your ability to work and do things you enjoy. COPD is not the same for everyone, and it may change over time. Your health care providers can help you come up with the COPD management plan that works best for you. How to manage lifestyle changes Treatment plan Work closely with your health care providers. Follow your COPD management plan. This plan includes: Instructions about activities, exercises, diet, medicines, what to do when COPD flares up, and when to call your health care provider. A pulmonary rehabilitation program. In pulmonary rehab, you will learn about COPD, do exercises for fitness and breathing, and get support from health care providers and other people who have COPD. Managing emotions and stress Living with a chronic disease means you may also struggle with stressful emotions, such as sadness, fear, and worry. Here are some ways to manage these emotions: Talk to someone about your fear, anxiety, depression, or stress. Learn strategies to avoid or reduce stress and ask for help if you are struggling with depression or anxiety. Consider joining a COPD support group, online or in person.  Adjusting to changes COPD may limit the things you can do, but you can make certain changes to help you cope with the diagnosis. Ask for help when you need it. Getting support from friends, family, and your health care team is an important part of managing the condition. Try to get regular exercise as prescribed by a health care provider or pulmonary rehab team. Exercising can help COPD, even if you are a bit short of breath. Take steps to prevent infection and protect your lungs: Wash your hands often and avoid being in crowds. Stay away from friends and family members who are sick. Check your local air quality each day, and stay out of areas  where air pollution is likely. How to recognize changes in your condition Recognizing changes in your COPD COPD is a progressive disease. It is important to let the health care team know if your COPD is getting worse. Your treatment plan may need to change. Watch for: Increased shortness of breath, wheezing, cough, or fatigue. Loss of ability to exercise or perform daily activities, like climbing stairs. More frequent symptom flares. Signs of depression or anxiety. Recognizing stress It is normal to have additional stress when you have COPD. However, prolonged stress and anxiety can make COPD worse and lead to depression. Recognize the warning signs, which include: Feeling sad or worried more often or most of the time. Having less energy and losing interest in pleasurable activities. Changes in your appetite or sleeping patterns. Being easily angered or irritated. Having unexplained aches and pains, digestive problems, or headaches. Follow these instructions at home: Eating and drinking  Eat foods that are high in fiber, such as fresh fruits and vegetables, whole grains, and beans. Limit foods that are high in fat and processed sugars, such as fried or sweet foods. Follow a balanced diet and maintain a healthy weight. Being overweight or underweight can make COPD worse. You may work with a dietitian as part of your pulmonary rehab program. Drink enough fluid to keep your urine pale yellow. If you drink alcohol: Limit how much you have to: 0-1 drink a day for women who are not pregnant. 0-2 drinks a day for men. Know how much alcohol is in your drink. In the [place] one drink equals one 12 oz bottle   of beer (355 mL), one 5 oz glass of wine (148 mL), or one 1 oz glass of hard liquor (44 mL). Lifestyle If you smoke, the most important thing that you can do is to stop smoking. Continuing to smoke will cause the disease to progress faster. Do not use any products that contain nicotine or  tobacco. These products include cigarettes, chewing tobacco, and vaping devices, such as e-cigarettes. If you need help quitting, ask your health care provider. Avoid exposure to things that irritate your lungs, such as smoke, chemicals, and fumes. Activity Balance exercise and rest. Take short walks every 1-2 hours. This is important to improve blood flow and breathing. Ask for help if you feel weak or unsteady. Do exercises that include controlled breathing with body movement, such as tai chi. General instructions Take over-the-counter and prescription medicines only as told by your health care provider. Take vitamin and protein supplements as told by your health care provider or dietitian. Practice good oral hygiene and see your dental care provider regularly. An oral infection can also spread to your lungs. Make sure you receive all the vaccines that your health care provider recommends. Keep all follow-up visits. This is important. Contact a health care provider if you: Are struggling to manage your COPD. Have emotional stress that interferes with your ability to cope with COPD. Get help right away if you: Have thoughts of suicide, death, or hurting yourself or others. If you ever feel like you may hurt yourself or others, or have thoughts about taking your own life, get help right away. Go to your nearest emergency department or: Call your local emergency services (911 in the [place] Call a suicide crisis helpline, such as the National Suicide Prevention Lifeline at 1-800-273-8255 or 988 in the [place] This is open 24 hours a day in the [place] Text the Crisis Text Line at 741741 (in the [place] Summary Being diagnosed with chronic obstructive pulmonary disease (COPD) changes your life physically and emotionally. Work with your health care providers and follow your COPD management plan. A pulmonary rehabilitation program is an important part of COPD management. Prolonged stress, anxiety, and  depression can make COPD worse. Let your health care provider know if emotional stress interferes with your ability to cope with and manage COPD. This information is not intended to replace advice given to you by your health care provider. Make sure you discuss any questions you have with your health care provider. Document Revised: 10/24/2020 Document Reviewed: 04/18/2020 Elsevier Patient Education  2023 [employer]  

## 2022-03-24 ENCOUNTER — Encounter: Payer: Self-pay | Admitting: Nurse Practitioner

## 2022-03-24 ENCOUNTER — Ambulatory Visit (INDEPENDENT_AMBULATORY_CARE_PROVIDER_SITE_OTHER): Payer: Medicare Other | Admitting: Nurse Practitioner

## 2022-03-24 VITALS — BP 132/80 | HR 94 | Temp 97.6°F | Ht 60.0 in | Wt 103.5 lb

## 2022-03-24 DIAGNOSIS — F329 Major depressive disorder, single episode, unspecified: Secondary | ICD-10-CM

## 2022-03-24 DIAGNOSIS — L821 Other seborrheic keratosis: Secondary | ICD-10-CM

## 2022-03-24 DIAGNOSIS — F411 Generalized anxiety disorder: Secondary | ICD-10-CM

## 2022-03-24 DIAGNOSIS — Z1231 Encounter for screening mammogram for malignant neoplasm of breast: Secondary | ICD-10-CM | POA: Diagnosis not present

## 2022-03-24 DIAGNOSIS — I10 Essential (primary) hypertension: Secondary | ICD-10-CM

## 2022-03-24 DIAGNOSIS — Z23 Encounter for immunization: Secondary | ICD-10-CM

## 2022-03-24 DIAGNOSIS — E871 Hypo-osmolality and hyponatremia: Secondary | ICD-10-CM | POA: Diagnosis not present

## 2022-03-24 DIAGNOSIS — F17210 Nicotine dependence, cigarettes, uncomplicated: Secondary | ICD-10-CM

## 2022-03-24 DIAGNOSIS — M810 Age-related osteoporosis without current pathological fracture: Secondary | ICD-10-CM

## 2022-03-24 DIAGNOSIS — Z0000 Encounter for general adult medical examination without abnormal findings: Secondary | ICD-10-CM

## 2022-03-24 DIAGNOSIS — J410 Simple chronic bronchitis: Secondary | ICD-10-CM | POA: Diagnosis not present

## 2022-03-24 DIAGNOSIS — K219 Gastro-esophageal reflux disease without esophagitis: Secondary | ICD-10-CM

## 2022-03-24 DIAGNOSIS — E782 Mixed hyperlipidemia: Secondary | ICD-10-CM | POA: Diagnosis not present

## 2022-03-24 DIAGNOSIS — H6123 Impacted cerumen, bilateral: Secondary | ICD-10-CM

## 2022-03-24 MED ORDER — OMEPRAZOLE 20 MG PO CPDR: 20.0000 mg | DELAYED_RELEASE_CAPSULE | Freq: Every day | ORAL | 4 refills | Status: DC

## 2022-03-24 MED ORDER — UMECLIDINIUM-VILANTEROL 62.5-25 MCG/ACT IN AEPB: 1.0000 | INHALATION_SPRAY | Freq: Every day | RESPIRATORY_TRACT | 4 refills | Status: DC

## 2022-03-24 MED ORDER — OLMESARTAN MEDOXOMIL 20 MG PO TABS: 20.0000 mg | ORAL_TABLET | Freq: Every day | ORAL | 4 refills | Status: DC

## 2022-03-24 MED ORDER — ROSUVASTATIN CALCIUM 5 MG PO TABS: 5.0000 mg | ORAL_TABLET | Freq: Every day | ORAL | 4 refills | Status: DC

## 2022-03-24 MED ORDER — ALENDRONATE SODIUM 70 MG PO TABS: 70.0000 mg | ORAL_TABLET | ORAL | 4 refills | Status: DC

## 2022-03-24 MED ORDER — VENLAFAXINE HCL ER 75 MG PO CP24: 75.0000 mg | ORAL_CAPSULE | Freq: Every day | ORAL | 4 refills | Status: DC

## 2022-03-24 NOTE — Assessment & Plan Note (Signed)
I have recommended complete cessation of tobacco use. I have discussed various options available for assistance with tobacco cessation including over the counter methods (Nicotine gum, patch and lozenges). We also discussed prescription options (Chantix, Nicotine Inhaler / Nasal Spray). The patient is not interested in pursuing any prescription tobacco cessation options at this time.  

## 2022-03-24 NOTE — Assessment & Plan Note (Signed)
Chronic in nature.  Recheck NA + today, has been adding salt to diet daily.  Is on Effexor which may reduce NA+, may need to consider reduction of Effexor in future.

## 2022-03-24 NOTE — Assessment & Plan Note (Signed)
Chronic, stable.  Continue Prilosec daily and adjust as needed.  Mag level annually. Risks of PPI use were discussed with patient including bone loss, C. Diff diarrhea, pneumonia, infections, CKD, electrolyte abnormalities. Verbalizes understanding and chooses to continue the medication.

## 2022-03-24 NOTE — Assessment & Plan Note (Signed)
Chronic, ongoing.  Spirometry on 10/16/21 stable FEV1 62% and FEV1/FVC 79%.  Improved sounds with Anoro on board, continue this and Albuterol as needed.  Minimally uses Albuterol.  Adjust regimen as needed.  Recommend complete cessation of smoking.

## 2022-03-24 NOTE — Assessment & Plan Note (Signed)
Chronic, ongoing.  BP at goal for age in office today.  At this time continue Olmesartan at current dose and adjust as needed.  Would consider changing Effexor to an SSRI due to effect of Effexor on BP if elevations ever present.  She is to check BP daily at home and document.  Recommend complete cessation of smoking and focus on DASH diet.  LABS: CBC, CMP, TSH.  Refills sent in.  Return to office in 6 months, she is to alert provider if any elevations >130/80 on BP at home and be seen sooner if present.

## 2022-03-24 NOTE — Assessment & Plan Note (Signed)
Chronic, stable.  Denies SI/HI.  Continue Effexor 75 MG daily as is beneficial at this time, could consider change to SSRI if elevation in BP ever presents.  At this time return in 6 months, sooner if worsening mood.

## 2022-03-24 NOTE — Assessment & Plan Note (Signed)
Chronic, stable.  Denies SI/HI.  Continue current regimen and adjust as needed.

## 2022-03-24 NOTE — Assessment & Plan Note (Signed)
Ongoing, continue Fosamax.  May consider period off Fosamax in future, at this time continue due to ongoing osteoporosis on imaging.  Vit D level annually.  DEXA next in April 2024.

## 2022-03-24 NOTE — Progress Notes (Signed)
BP 132/80   Pulse 94   Temp 97.6 F (36.4 C) (Oral)   Ht 5' (1.524 m)   Wt 103 lb 8 oz (46.9 kg)   BMI 20.21 kg/m    Subjective:    Patient ID: Lauren Nolan, female    DOB: Sep 01, 1945, 76 y.o.   MRN: [id number]  HPI: Lauren Nolan is a 76 y.o. female presenting on 03/24/2022 for comprehensive medical examination. Current medical complaints include:none  She currently lives with: husband Menopausal Symptoms: no  HYPERTENSION Continues on Olmesartan and Crestor daily.  Takes Prilosec daily, which helps relieve gas in her stomach per her report. Hypertension status: stable  Satisfied with current treatment? yes Duration of hypertension: chronic BP monitoring frequency:  rarely BP range: 110/70 BP medication side effects:  no Medication compliance: good compliance Aspirin: yes Recurrent headaches: no Visual changes: no Palpitations: no Dyspnea: no Chest pain: no Lower extremity edema: no Dizzy/lightheaded: no The ASCVD Risk score (Arnett DK, et al., 2019) failed to calculate for the following reasons:   The valid HDL cholesterol range is 20 to 100 mg/dL  OSTEOPOROSIS Last DEXA 07/17/20 -- T score -3.5 and is taking Fosamax without issue. Satisfied with current treatment?: yes Medication side effects: no Medication compliance: good compliance Past osteoporosis medications/treatments: none Adequate calcium & vitamin D: yes Intolerance to bisphosphonates:no Weight bearing exercises: yes   COPD Using Anoro and Albuterol.   Last spirometry on 10/16/21 FEV1 62% and Anoro was started. Continues to smoke, 1/2 PPD, not interested in quitting.  Has been smoking since she was age 60.   COPD status: controlled Satisfied with current treatment?: yes Oxygen use: no Dyspnea frequency: none Cough frequency: none Rescue inhaler frequency:  no use with addition of Anoro Limitation of activity: no Productive cough: none Last Spirometry: 03/19/20 Pneumovax: Up to Date Influenza:  Up to Date   DEPRESSION/ANXIETY Continues on Effexor 75 MG.   Mood status: stable Satisfied with current treatment?: yes Symptom severity: mild  Duration of current treatment : chronic Side effects: no Medication compliance: good compliance Psychotherapy/counseling: none Previous psychiatric medications: Effexor Depressed mood: no Anxious mood: occasionally Anhedonia: no Significant weight loss or gain: no Insomnia: none Fatigue: no Feelings of worthlessness or guilt: no Impaired concentration/indecisiveness: occasionally Suicidal ideations: no Hopelessness: no Crying spells: no    03/24/2022   10:05 AM 02/25/2022    8:07 AM 10/16/2021    3:52 PM 08/15/2021    9:43 AM 07/26/2021   10:55 AM  Depression screen PHQ 2/9  Decreased Interest 0 0 0 0 0  Down, Depressed, Hopeless 0 0 0 0 0  PHQ - 2 Score 0 0 0 0 0  Altered sleeping 0 0 0 0 0  Tired, decreased energy 0 0 0 0 0  Change in appetite 0 0 0 0 0  Feeling bad or failure about yourself  0 0 0 0 0  Trouble concentrating 0 0 0 0 0  Moving slowly or fidgety/restless 0 0 0  0  Suicidal thoughts 0 0 0 0 0  PHQ-9 Score 0 0 0 0 0  Difficult doing work/chores Not difficult at all Not difficult at all  Not difficult at all Not difficult at all       02/08/2022    2:42 PM 02/08/2022    2:58 PM 02/25/2022    8:06 AM 03/24/2022   10:04 AM 03/24/2022   10:21 AM  Fall Risk  Falls in the past year?   0  1 0  Was there an injury with Fall?   0 1 0  Fall Risk Category Calculator   0 3 0  Fall Risk Category   Low High Low  Patient Fall Risk Level _0   Patient at Risk for Falls Due to   No Fall Risks History of fall(s) No Fall Risks  Fall risk Follow up   Falls evaluation completed Falls evaluation completed Falls prevention discussed    Functional Status Survey: Is the patient deaf or have difficulty hearing?: No Does the patient have difficulty seeing, even when  wearing glasses/contacts?: No Does the patient have difficulty concentrating, remembering, or making decisions?: No Does the patient have difficulty walking or climbing stairs?: No Does the patient have difficulty dressing or bathing?: No Does the patient have difficulty doing errands alone such as visiting a doctor's office or shopping?: No   Past Medical History:  Past Medical History:  Diagnosis Date   Allergy    Anxiety    COPD (chronic obstructive pulmonary disease) ([place])    Depression    GERD (gastroesophageal reflux disease)    Hypertension    Internal hemorrhoid    Lichen sclerosus    Osteoporosis    Spinal stenosis    Tobacco use     Surgical History:  Past Surgical History:  Procedure Laterality Date   BREAST EXCISIONAL BIOPSY Bilateral    1980's - benign   BREAST SURGERY Bilateral    biopsy   COLONOSCOPY WITH PROPOFOL N/A 10/03/2019   Procedure: COLONOSCOPY WITH PROPOFOL;  Surgeon: Jonathon Bellows, MD;  Location: [facility] ENDOSCOPY;  Service: Gastroenterology;  Laterality: N/A;   EYE SURGERY Bilateral Feb-Mar 2016   HERNIA REPAIR     TUBAL LIGATION      Medications:  Current Outpatient Medications on File Prior to Visit  Medication Sig   albuterol (VENTOLIN HFA) 108 (90 Base) MCG/ACT inhaler Inhale 1-2 puffs into the lungs every 6 (six) hours as needed for wheezing or shortness of breath.   aspirin 81 MG tablet Take 81 mg by mouth daily.   Turmeric (QC TUMERIC COMPLEX PO) Take by mouth.   VITAMIN D, CHOLECALCIFEROL, PO Take by mouth.   No current facility-administered medications on file prior to visit.    Allergies:  Allergies  Allergen Reactions   Elemental Sulfur    Midol [Acetaminophen] Hypertension   Sulfa Antibiotics     Social History:  Social History   Socioeconomic History   Marital status: Married    Spouse name: Not on file   Number of children: Not on file   Years of education: Not on file   Highest education level: 8th grade  Occupational  History   Occupation: retired  Tobacco Use   Smoking status: Every Day    Packs/day: 0.25    Types: Cigarettes   Smokeless tobacco: Never   Tobacco comments:    1 pack every 3 days   Vaping Use   Vaping Use: Never used  Substance and Sexual Activity   Alcohol use: Yes    Alcohol/week: 3.0 standard drinks of alcohol    Types: 3 Glasses of wine per week   Drug use: No   Sexual activity: Yes  Other Topics Concern   Not on file  Social History Narrative   Retired, has daughter living at home with her and her husband    Social Determinants of Radio broadcast assistant Strain:  Low Risk  (08/15/2021)   Overall Financial Resource Strain (CARDIA)    Difficulty of Paying Living Expenses: Not hard at all  Food Insecurity: No Food Insecurity (08/15/2021)   Hunger Vital Sign    Worried About Running Out of Food in the Last Year: Never true    Ran Out of Food in the Last Year: Never true  Transportation Needs: No Transportation Needs (08/15/2021)   PRAPARE - Hydrologist (Medical): No    Lack of Transportation (Non-Medical): No  Physical Activity: Inactive (08/15/2021)   Exercise Vital Sign    Days of Exercise per Week: 0 days    Minutes of Exercise per Session: 0 min  Stress: No Stress Concern Present (08/15/2021)   [place]    Feeling of Stress : Not at all  Social Connections: Moderately Integrated (08/15/2021)   Social Connection and Isolation Panel [NHANES]    Frequency of Communication with Friends and Family: More than three times a week    Frequency of Social Gatherings with Friends and Family: Once a week    Attends Religious Services: More than 4 times per year    Active Member of [employer] or Organizations: No    Attends Archivist Meetings: Never    Marital Status: Married  Human resources officer Violence: Not At Risk (08/15/2021)   Humiliation, Afraid, Rape, and Kick questionnaire     Fear of Current or Ex-Partner: No    Emotionally Abused: No    Physically Abused: No    Sexually Abused: No   Social History   Tobacco Use  Smoking Status Every Day   Packs/day: 0.25   Types: Cigarettes  Smokeless Tobacco Never  Tobacco Comments   1 pack every 3 days    Social History   Substance and Sexual Activity  Alcohol Use Yes   Alcohol/week: 3.0 standard drinks of alcohol   Types: 3 Glasses of wine per week    Family History:  Family History  Problem Relation Age of Onset   Dementia Mother    Anxiety disorder Mother    Hypertension Mother    Epilepsy Mother    Cancer Father        lung   Diabetes Brother    Colon cancer Brother    Hypertension Daughter    Cancer Daughter    Heart disease Maternal Grandfather        MI   Hypertension Brother    Diabetes Daughter    Arthritis Daughter    Breast cancer Neg Hx     Past medical history, surgical history, medications, allergies, family history and social history reviewed with patient today and changes made to appropriate areas of the chart.   Review of Systems - All other ROS negative except what is listed above and in the HPI.      Objective:    BP 132/80   Pulse 94   Temp 97.6 F (36.4 C) (Oral)   Ht 5' (1.524 m)   Wt 103 lb 8 oz (46.9 kg)   BMI 20.21 kg/m   Wt Readings from Last 3 Encounters:  03/24/22 103 lb 8 oz (46.9 kg)  02/25/22 103 lb 11.2 oz (47 kg)  02/08/22 94 lb 12.8 oz (43 kg)    Physical Exam Vitals and nursing note reviewed.  Constitutional:      General: She is awake. She is not in acute distress.    Appearance: She is  well-developed. She is not ill-appearing.  HENT:     Head: Normocephalic and atraumatic.     Right Ear: Hearing, ear canal and external ear normal. No drainage. There is impacted cerumen.     Left Ear: Hearing, ear canal and external ear normal. No drainage. There is impacted cerumen.     Ears:     Comments: Cerumen impactions bilaterally R>L.  Both ears  irrigated by provider, tolerated well.  Large chunk of cerumen out of right side and mild bleeding due to irritation with cleaning -- discussed with patient.    Nose: Nose normal.     Right Sinus: No maxillary sinus tenderness or frontal sinus tenderness.     Left Sinus: No maxillary sinus tenderness or frontal sinus tenderness.     Mouth/Throat:     Mouth: Mucous membranes are moist.     Pharynx: Oropharynx is clear. Uvula midline. No pharyngeal swelling, oropharyngeal exudate or posterior oropharyngeal erythema.  Eyes:     General: Lids are normal.        Right eye: No discharge.        Left eye: No discharge.     Extraocular Movements: Extraocular movements intact.     Conjunctiva/sclera: Conjunctivae normal.     Pupils: Pupils are equal, round, and reactive to light.     Visual Fields: Right eye visual fields normal and left eye visual fields normal.  Neck:     Thyroid: No thyromegaly.     Vascular: No carotid bruit.     Trachea: Trachea normal.  Cardiovascular:     Rate and Rhythm: Normal rate and regular rhythm.     Heart sounds: Normal heart sounds. No murmur heard.    No gallop.  Pulmonary:     Effort: Pulmonary effort is normal. No accessory muscle usage or respiratory distress.     Breath sounds: Normal breath sounds.  Abdominal:     General: Bowel sounds are normal.     Palpations: Abdomen is soft. There is no hepatomegaly or splenomegaly.     Tenderness: There is no abdominal tenderness.  Musculoskeletal:        General: Normal range of motion.     Cervical back: Normal range of motion and neck supple.     Right lower leg: No edema.     Left lower leg: No edema.  Lymphadenopathy:     Head:     Right side of head: No submental, submandibular, tonsillar, preauricular or posterior auricular adenopathy.     Left side of head: No submental, submandibular, tonsillar, preauricular or posterior auricular adenopathy.     Cervical: No cervical adenopathy.  Skin:    General:  Skin is warm and dry.     Capillary Refill: Capillary refill takes less than 2 seconds.     Findings: No rash.       Neurological:     Mental Status: She is alert and oriented to person, place, and time.     Gait: Gait is intact.     Deep Tendon Reflexes: Reflexes are normal and symmetric.     Reflex Scores:      Brachioradialis reflexes are 2+ on the right side and 2+ on the left side.      Patellar reflexes are 2+ on the right side and 2+ on the left side. Psychiatric:        Attention and Perception: Attention normal.        Mood and Affect: Mood normal.  Speech: Speech normal.        Behavior: Behavior normal. Behavior is cooperative.        Thought Content: Thought content normal.        Judgment: Judgment normal.    Results for orders placed or performed in visit on 41/66/06  Basic metabolic panel  Result Value Ref Range   Glucose 83 70 - 99 mg/dL   BUN 11 8 - 27 mg/dL   Creatinine, Ser 0.73 0.57 - 1.00 mg/dL   eGFR 86 >59 mL/min/1.73   BUN/Creatinine Ratio 15 12 - 28   Sodium 131 (L) 134 - 144 mmol/L   Potassium 4.9 3.5 - 5.2 mmol/L   Chloride 92 (L) 96 - 106 mmol/L   CO2 26 20 - 29 mmol/L   Calcium 9.7 8.7 - 10.3 mg/dL  Microalbumin, Urine Waived  Result Value Ref Range   Microalb, Ur Waived 10 0 - 19 mg/L   Creatinine, Urine Waived 50 10 - 300 mg/dL   Microalb/Creat Ratio 30-300 (H) <30 mg/g  Lipid Panel w/o Chol/HDL Ratio  Result Value Ref Range   Cholesterol, Total 163 100 - 199 mg/dL   Triglycerides 84 0 - 149 mg/dL   HDL 114 >39 mg/dL   VLDL Cholesterol Cal 15 5 - 40 mg/dL   LDL Chol Calc (NIH) 34 0 - 99 mg/dL      Assessment & Plan:   Problem List Items Addressed This Visit       Cardiovascular and Mediastinum   Hypertension    Chronic, ongoing.  BP at goal for age in office today.  At this time continue Olmesartan at current dose and adjust as needed.  Would consider changing Effexor to an SSRI due to effect of Effexor on BP if elevations  ever present.  She is to check BP daily at home and document.  Recommend complete cessation of smoking and focus on DASH diet.  LABS: CBC, CMP, TSH.  Refills sent in.  Return to office in 6 months, she is to alert provider if any elevations >130/80 on BP at home and be seen sooner if present.      Relevant Medications   olmesartan (BENICAR) 20 MG tablet   rosuvastatin (CRESTOR) 5 MG tablet   Other Relevant Orders   CBC with Differential/Platelet   Comprehensive metabolic panel   TSH     Respiratory   COPD (chronic obstructive pulmonary disease) (HCC) - Primary    Chronic, ongoing.  Spirometry on 10/16/21 stable FEV1 62% and FEV1/FVC 79%.  Improved sounds with Anoro on board, continue this and Albuterol as needed.  Minimally uses Albuterol.  Adjust regimen as needed.  Recommend complete cessation of smoking.      Relevant Medications   umeclidinium-vilanterol (ANORO ELLIPTA) 62.5-25 MCG/ACT AEPB   Other Relevant Orders   CBC with Differential/Platelet     Digestive   GERD (gastroesophageal reflux disease)    Chronic, stable.  Continue Prilosec daily and adjust as needed.  Mag level annually. Risks of PPI use were discussed with patient including bone loss, C. Diff diarrhea, pneumonia, infections, CKD, electrolyte abnormalities. Verbalizes understanding and chooses to continue the medication.        Relevant Medications   omeprazole (PRILOSEC) 20 MG capsule   Other Relevant Orders   Magnesium     Musculoskeletal and Integument   Osteoporosis    Ongoing, continue Fosamax.  May consider period off Fosamax in future, at this time continue due to ongoing osteoporosis on imaging.  Vit D level annually.  DEXA next in April 2024.      Relevant Medications   alendronate (FOSAMAX) 70 MG tablet   Other Relevant Orders   VITAMIN D 25 Hydroxy (Vit-D Deficiency, Fractures)   DG Bone Density   Seborrheic keratosis    Small lesion to left lower side abdomen -- suspicious in nature.  Will get  her back into dermatology.      Relevant Orders   Ambulatory referral to Dermatology     Other   Depression    Chronic, stable.  Denies SI/HI.  Continue Effexor 75 MG daily as is beneficial at this time, could consider change to SSRI if elevation in BP ever presents.  At this time return in 6 months, sooner if worsening mood.      Relevant Medications   venlafaxine XR (EFFEXOR-XR) 75 MG 24 hr capsule   Generalized anxiety disorder    Chronic, stable.  Denies SI/HI.  Continue current regimen and adjust as needed.      Relevant Medications   venlafaxine XR (EFFEXOR-XR) 75 MG 24 hr capsule   Hyponatremia    Chronic in nature.  Recheck NA + today, has been adding salt to diet daily.  Is on Effexor which may reduce NA+, may need to consider reduction of Effexor in future.      Relevant Orders   Comprehensive metabolic panel   Mixed hyperlipidemia    Chronic, ongoing.  Tolerating Crestor, continue this daily and adjust as needed.  Lipid panel today.      Relevant Medications   olmesartan (BENICAR) 20 MG tablet   rosuvastatin (CRESTOR) 5 MG tablet   Other Relevant Orders   Comprehensive metabolic panel   Lipid Panel w/o Chol/HDL Ratio   Nicotine dependence, cigarettes, uncomplicated    I have recommended complete cessation of tobacco use. I have discussed various options available for assistance with tobacco cessation including over the counter methods (Nicotine gum, patch and lozenges). We also discussed prescription options (Chantix, Nicotine Inhaler / Nasal Spray). The patient is not interested in pursuing any prescription tobacco cessation options at this time.       Other Visit Diagnoses     Bilateral impacted cerumen       Bilateral ears irrigated for moderate cerumen, large amount right side.  Pt tolerated procedure well.   Encounter for screening mammogram for malignant neoplasm of breast       Mammogram ordered for next June 2024.   Relevant Orders   MM 3D SCREEN  BREAST BILATERAL   Encounter for annual physical exam       Annual physical today with labs and health maintenance reviewed, discussed with patient.        Follow up plan: Return in about 6 months (around 09/23/2022) for HTN/HLD, COPD, MOOD.   LABORATORY TESTING:  - Pap smear: not applicable  IMMUNIZATIONS:   - Tdap: Tetanus vaccination status reviewed: last tetanus booster within 10 years. - Influenza: Up To Date - Pneumovax: Up to date - Prevnar: Up to date - HPV: Not applicable - Zostavax vaccine: Up to date - Covid == Up To Date  SCREENING: -Mammogram: Up To Date 09/27/21 - Colonoscopy: Up to date  - Bone Density: Up To Date -Hearing Test: Not applicable  -Spirometry: Up To Date  PATIENT COUNSELING:   Advised to take 1 mg of folate supplement per day if capable of pregnancy.   Sexuality: Discussed sexually transmitted diseases, partner selection, use of condoms, avoidance of unintended  pregnancy  and contraceptive alternatives.   Advised to avoid cigarette smoking.  I discussed with the patient that most people either abstain from alcohol or drink within safe limits (<=14/week and <=4 drinks/occasion for males, <=7/weeks and <= 3 drinks/occasion for females) and that the risk for alcohol disorders and other health effects rises proportionally with the number of drinks per week and how often a drinker exceeds daily limits.  Discussed cessation/primary prevention of drug use and availability of treatment for abuse.   Diet: Encouraged to adjust caloric intake to maintain  or achieve ideal body weight, to reduce intake of dietary saturated fat and total fat, to limit sodium intake by avoiding high sodium foods and not adding table salt, and to maintain adequate dietary potassium and calcium preferably from fresh fruits, vegetables, and low-fat dairy products.    Stressed the importance of regular exercise  Injury prevention: Discussed safety belts, safety helmets, smoke  detector, smoking near bedding or upholstery.   Dental health: Discussed importance of regular tooth brushing, flossing, and dental visits.    NEXT PREVENTATIVE PHYSICAL DUE IN 1 YEAR. Return in about 6 months (around 09/23/2022) for HTN/HLD, COPD, MOOD.

## 2022-03-24 NOTE — Assessment & Plan Note (Signed)
Chronic, ongoing.  Tolerating Crestor, continue this daily and adjust as needed.  Lipid panel today.

## 2022-03-24 NOTE — Assessment & Plan Note (Signed)
Small lesion to left lower side abdomen -- suspicious in nature.  Will get her back into dermatology.

## 2022-03-25 ENCOUNTER — Ambulatory Visit: Payer: Self-pay

## 2022-03-25 LAB — COMPREHENSIVE METABOLIC PANEL
ALT: 23 IU/L (ref 0–32)
AST: 33 IU/L (ref 0–40)
Albumin/Globulin Ratio: 2.1 (ref 1.2–2.2)
Albumin: 4.7 g/dL (ref 3.8–4.8)
Alkaline Phosphatase: 67 IU/L (ref 44–121)
BUN/Creatinine Ratio: 14 (ref 12–28)
BUN: 12 mg/dL (ref 8–27)
Bilirubin Total: 0.6 mg/dL (ref 0.0–1.2)
CO2: 25 mmol/L (ref 20–29)
Calcium: 9.7 mg/dL (ref 8.7–10.3)
Chloride: 100 mmol/L (ref 96–106)
Creatinine, Ser: 0.83 mg/dL (ref 0.57–1.00)
Globulin, Total: 2.2 g/dL (ref 1.5–4.5)
Glucose: 73 mg/dL (ref 70–99)
Potassium: 4.2 mmol/L (ref 3.5–5.2)
Sodium: 140 mmol/L (ref 134–144)
Total Protein: 6.9 g/dL (ref 6.0–8.5)
eGFR: 73 mL/min/{1.73_m2} (ref >59)

## 2022-03-25 LAB — CBC WITH DIFFERENTIAL/PLATELET
Basophils Absolute: 0.2 10*3/uL (ref 0.0–0.2)
Basos: 2 %
EOS (ABSOLUTE): 0.2 10*3/uL (ref 0.0–0.4)
Eos: 2 %
Hematocrit: 43 % (ref 34.0–46.6)
Hemoglobin: 14.7 g/dL (ref 11.1–15.9)
Immature Grans (Abs): 0 10*3/uL (ref 0.0–0.1)
Immature Granulocytes: 0 %
Lymphocytes Absolute: 2.3 10*3/uL (ref 0.7–3.1)
Lymphs: 26 %
MCH: 33.6 pg — ABNORMAL HIGH (ref 26.6–33.0)
MCHC: 34.2 g/dL (ref 31.5–35.7)
MCV: 98 fL — ABNORMAL HIGH (ref 79–97)
Monocytes Absolute: 0.9 10*3/uL (ref 0.1–0.9)
Monocytes: 10 %
Neutrophils Absolute: 5.2 10*3/uL (ref 1.4–7.0)
Neutrophils: 60 %
Platelets: 285 10*3/uL (ref 150–450)
RBC: 4.38 x10E6/uL (ref 3.77–5.28)
RDW: 11.3 % — ABNORMAL LOW (ref 11.7–15.4)
WBC: 8.7 10*3/uL (ref 3.4–10.8)

## 2022-03-25 LAB — LIPID PANEL W/O CHOL/HDL RATIO
Cholesterol, Total: 158 mg/dL (ref 100–199)
HDL: 107 mg/dL (ref >39)
LDL Chol Calc (NIH): 36 mg/dL (ref 0–99)
Triglycerides: 81 mg/dL (ref 0–149)
VLDL Cholesterol Cal: 15 mg/dL (ref 5–40)

## 2022-03-25 LAB — MAGNESIUM: Magnesium: 1.9 mg/dL (ref 1.6–2.3)

## 2022-03-25 LAB — TSH: TSH: 2.1 u[IU]/mL (ref 0.450–4.500)

## 2022-03-25 LAB — VITAMIN D 25 HYDROXY (VIT D DEFICIENCY, FRACTURES): Vit D, 25-Hydroxy: 38.5 ng/mL (ref 30.0–100.0)

## 2022-03-25 NOTE — Telephone Encounter (Signed)
Message from Loma Boston sent at 03/25/2022  7:54 AM EST  Summary: pt had her ears cleaned out yesterday/ no better/timeframe?   Pt had her ears washed out yesterday, excessive wax        Unable to LM - "call cannot be completed at this time."

## 2022-03-25 NOTE — Telephone Encounter (Signed)
Message from Loma Boston sent at 03/25/2022  7:54 AM EST  Summary: pt had her ears cleaned out yesterday/ no better/timeframe?   Pt had her ears washed out yesterday, excessive wax. Procedure by [facility] and states that it still feels like her ears are plugged up and can not hear well. Wanting to know if it takes time to return to normal? [phone]          Chief Complaint: decreased hearing to both ears Symptoms: feel like she is in a "tunnel", mild earache to left ear Frequency: yesterday Pertinent Negatives: Patient denies dizziness or ringing in ear Disposition: '[]'$ ED /'[]'$ Urgent Care (no appt availability in office) / '[x]'$ Appointment(In office/virtual)/ '[]'$  [place] [facility]/ '[]'$ Home Care/ '[]'$ Refused Recommended Disposition /'[]'$ [facility]/ '[]'$  Follow-up with PCP Additional Notes: appt made for Wednesday  Reason for Disposition  Earache  Answer Assessment - Initial Assessment Questions 1. DESCRIPTION: "What type of hearing problem are you having? Describe it for me." (e.g., complete hearing loss, partial loss)     Decreased hearing feels like in a drum  2. LOCATION: "One or both ears?" If one, ask: "Which ear?"     Both moderate 3. SEVERITY: "Can you hear anything?" If Yes, ask: "What can you hear?" (e.g., ticking watch, whisper, talking)   - MILD:  Difficulty hearing soft speech, quiet library sounds, or speech from a distance or over background noise.   - MODERATE: Difficulty hearing normal speech even at closed distances.   - SEVERE: Unable to hear most normal conversation and talking; only able to hear loud sounds such as an alarm clock.     moderate 4. ONSET: "When did this begin?" "Did it start suddenly or come on gradually?"     After the washing 5. PATTERN: "Does this come and go, or has it been constant since it started?"     constant 6. PAIN: "Is there any pain in your ear(s)?"  (Scale 1-10; or mild, moderate, severe)   - NONE (0): no pain   - MILD  (1-3): doesn't interfere with normal activities    - MODERATE (4-7): interferes with normal activities or awakens from sleep    - SEVERE (8-10): excruciating pain, unable to do any normal activities      Mild left ear 7. CAUSE: "What do you think is causing this hearing problem?"     unsure 8. OTHER SYMPTOMS: "Do you have any other symptoms?" (e.g., dizziness, ringing in ears)     no 9. PREGNANCY: "Is there any chance you are pregnant?" "When was your last menstrual period?"     N/a  Protocols used: Hearing Loss or Change-A-AH

## 2022-03-25 NOTE — Telephone Encounter (Signed)
Message from Loma Boston sent at 03/25/2022  7:54 AM EST  Summary: pt had her ears cleaned out yesterday/ no better/timeframe?   Pt had her ears washed out yesterday, excessive wax. Procedure by [facility] and states that it still feels like her ears are plugged up and can not hear well. Wanting to know if it takes time to return to normal? [phone]        Attempted to call pt but received message" call cannot be completed at this time."

## 2022-03-25 NOTE — Progress Notes (Signed)
Good afternoon, please let Ileah know her labs have returned: - CBC shows no anemia or infection. - Kidney function, creatinine and eGFR, remains normal, as is liver function, AST and ALT.  - Cholesterol labs are at goal, continue medication. - Overall remaining labs are stable. I see you ear is a bit muffled since cleaning and you are coming to see me tomorrow for this.  It can happen when lots of wax removed from ear so we will check this.  Any questions? Keep being stellar!!  Thank you for allowing me to participate in your care.  I appreciate you. Kindest regards, Jolene

## 2022-03-26 ENCOUNTER — Ambulatory Visit: Payer: Medicare Other | Admitting: Nurse Practitioner

## 2022-03-26 ENCOUNTER — Encounter: Payer: Self-pay | Admitting: Nurse Practitioner

## 2022-03-26 VITALS — BP 137/86 | HR 90 | Temp 97.7°F | Ht 60.0 in | Wt 103.9 lb

## 2022-03-26 DIAGNOSIS — H6690 Otitis media, unspecified, unspecified ear: Secondary | ICD-10-CM | POA: Insufficient documentation

## 2022-03-26 DIAGNOSIS — H6501 Acute serous otitis media, right ear: Secondary | ICD-10-CM | POA: Diagnosis not present

## 2022-03-26 MED ORDER — PREDNISONE 20 MG PO TABS: 40.0000 mg | ORAL_TABLET | Freq: Every day | ORAL | 0 refills | Status: AC

## 2022-03-26 MED ORDER — AMOXICILLIN-POT CLAVULANATE 875-125 MG PO TABS: 1.0000 | ORAL_TABLET | Freq: Two times a day (BID) | ORAL | 0 refills | Status: AC

## 2022-03-26 NOTE — Assessment & Plan Note (Signed)
Acute to right ear after cleaning two days ago. TM intact. Irritation to ear present and effusion. Will start Augmentin BID for 7 days and Prednisone 40 MG daily for 5 days for inflammation . Recommend no Q tips and may take Tylenol as needed at home.  Return in one week.

## 2022-03-26 NOTE — Progress Notes (Signed)
BP 137/86   Pulse 90   Temp 97.7 F (36.5 C) (Oral)   Ht 5' (1.524 m)   Wt 103 lb 14.4 oz (47.1 kg)   SpO2 95%   BMI 20.29 kg/m    Subjective:    Patient ID: Lauren Nolan, female    DOB: 1945-10-29, 76 y.o.   MRN: [id number]  HPI: Lauren Nolan is a 76 y.o. female  Chief Complaint  Patient presents with   Cant hear    Has not been able to hear since having her ears cleaned on Monday   EARS CLOGGED Reports having muffled hearing since having ears cleaned in office two days ago.  Has moderate amount cerumen removed at the time. Duration: days Involved ear(s): bilateral Severity:  1/10  Quality:  dull Fever: no Otorrhea: no Upper respiratory infection symptoms: no Pruritus: no Hearing loss: yes Water immersion yes Using Q-tips: yes Recurrent otitis media: no Status: stable Treatments attempted: none   Relevant past medical, surgical, family and social history reviewed and updated as indicated. Interim medical history since our last visit reviewed. Allergies and medications reviewed and updated.  Review of Systems  Constitutional:  Negative for activity change, appetite change, diaphoresis, fatigue and fever.  HENT:  Positive for ear pain and hearing loss. Negative for ear discharge.   Respiratory:  Negative for cough, chest tightness and shortness of breath.   Cardiovascular:  Negative for chest pain, palpitations and leg swelling.  Neurological: Negative.   Psychiatric/Behavioral: Negative.      Per HPI unless specifically indicated above     Objective:    BP 137/86   Pulse 90   Temp 97.7 F (36.5 C) (Oral)   Ht 5' (1.524 m)   Wt 103 lb 14.4 oz (47.1 kg)   SpO2 95%   BMI 20.29 kg/m   Wt Readings from Last 3 Encounters:  03/26/22 103 lb 14.4 oz (47.1 kg)  03/24/22 103 lb 8 oz (46.9 kg)  02/25/22 103 lb 11.2 oz (47 kg)    Physical Exam Vitals and nursing note reviewed.  Constitutional:      General: She is awake. She is not in acute  distress.    Appearance: She is well-developed and well-groomed. She is not ill-appearing or toxic-appearing.  HENT:     Head: Normocephalic.     Right Ear: External ear normal. Decreased hearing noted. A middle ear effusion is present. There is no impacted cerumen. Tympanic membrane is injected. Tympanic membrane is not perforated.     Left Ear: External ear normal. Decreased hearing noted. A middle ear effusion is present. There is no impacted cerumen. Tympanic membrane is not injected or perforated.     Nose: Nose normal.     Mouth/Throat:     Mouth: Mucous membranes are moist.  Eyes:     General: Lids are normal.        Right eye: No discharge.        Left eye: No discharge.     Conjunctiva/sclera: Conjunctivae normal.     Pupils: Pupils are equal, round, and reactive to light.  Neck:     Thyroid: No thyromegaly.     Vascular: No carotid bruit or JVD.  Cardiovascular:     Rate and Rhythm: Normal rate and regular rhythm.     Heart sounds: Normal heart sounds. No murmur heard.    No gallop.  Pulmonary:     Effort: Pulmonary effort is normal.     Breath  sounds: Normal breath sounds.  Abdominal:     General: Bowel sounds are normal.     Palpations: Abdomen is soft.  Musculoskeletal:     Cervical back: Normal range of motion and neck supple.     Right lower leg: No edema.     Left lower leg: No edema.  Lymphadenopathy:     Cervical: No cervical adenopathy.  Skin:    General: Skin is warm and dry.  Neurological:     Mental Status: She is alert and oriented to person, place, and time.  Psychiatric:        Attention and Perception: Attention normal.        Mood and Affect: Mood normal.        Behavior: Behavior normal. Behavior is cooperative.        Thought Content: Thought content normal.        Judgment: Judgment normal.    Results for orders placed or performed in visit on 03/24/22  CBC with Differential/Platelet  Result Value Ref Range   WBC 8.7 3.4 - 10.8 x10E3/uL    RBC 4.38 3.77 - 5.28 x10E6/uL   Hemoglobin 14.7 11.1 - 15.9 g/dL   Hematocrit 43.0 34.0 - 46.6 %   MCV 98 (H) 79 - 97 fL   MCH 33.6 (H) 26.6 - 33.0 pg   MCHC 34.2 31.5 - 35.7 g/dL   RDW 11.3 (L) 11.7 - 15.4 %   Platelets 285 150 - 450 x10E3/uL   Neutrophils 60 Not Estab. %   Lymphs 26 Not Estab. %   Monocytes 10 Not Estab. %   Eos 2 Not Estab. %   Basos 2 Not Estab. %   Neutrophils Absolute 5.2 1.4 - 7.0 x10E3/uL   Lymphocytes Absolute 2.3 0.7 - 3.1 x10E3/uL   Monocytes Absolute 0.9 0.1 - 0.9 x10E3/uL   EOS (ABSOLUTE) 0.2 0.0 - 0.4 x10E3/uL   Basophils Absolute 0.2 0.0 - 0.2 x10E3/uL   Immature Granulocytes 0 Not Estab. %   Immature Grans (Abs) 0.0 0.0 - 0.1 x10E3/uL  Comprehensive metabolic panel  Result Value Ref Range   Glucose 73 70 - 99 mg/dL   BUN 12 8 - 27 mg/dL   Creatinine, Ser 0.83 0.57 - 1.00 mg/dL   eGFR 73 >59 mL/min/1.73   BUN/Creatinine Ratio 14 12 - 28   Sodium 140 134 - 144 mmol/L   Potassium 4.2 3.5 - 5.2 mmol/L   Chloride 100 96 - 106 mmol/L   CO2 25 20 - 29 mmol/L   Calcium 9.7 8.7 - 10.3 mg/dL   Total Protein 6.9 6.0 - 8.5 g/dL   Albumin 4.7 3.8 - 4.8 g/dL   Globulin, Total 2.2 1.5 - 4.5 g/dL   Albumin/Globulin Ratio 2.1 1.2 - 2.2   Bilirubin Total 0.6 0.0 - 1.2 mg/dL   Alkaline Phosphatase 67 44 - 121 IU/L   AST 33 0 - 40 IU/L   ALT 23 0 - 32 IU/L  Lipid Panel w/o Chol/HDL Ratio  Result Value Ref Range   Cholesterol, Total 158 100 - 199 mg/dL   Triglycerides 81 0 - 149 mg/dL   HDL 107 >39 mg/dL   VLDL Cholesterol Cal 15 5 - 40 mg/dL   LDL Chol Calc (NIH) 36 0 - 99 mg/dL  TSH  Result Value Ref Range   TSH 2.100 0.450 - 4.500 uIU/mL  Magnesium  Result Value Ref Range   Magnesium 1.9 1.6 - 2.3 mg/dL  VITAMIN D 25 Hydroxy (Vit-D Deficiency,  Fractures)  Result Value Ref Range   Vit D, 25-Hydroxy 38.5 30.0 - 100.0 ng/mL      Assessment & Plan:   Problem List Items Addressed This Visit       Nervous and Auditory   Otitis media -  Primary    Acute to right ear after cleaning two days ago. TM intact. Irritation to ear present and effusion. Will start Augmentin BID for 7 days and Prednisone 40 MG daily for 5 days for inflammation . Recommend no Q tips and may take Tylenol as needed at home.  Return in one week.        Relevant Medications   amoxicillin-clavulanate (AUGMENTIN) 875-125 MG tablet     Follow up plan: Return in about 1 week (around 04/02/2022) for EAR CHECK.

## 2022-03-26 NOTE — Patient Instructions (Signed)

## 2022-03-28 ENCOUNTER — Ambulatory Visit: Payer: Self-pay | Admitting: *Deleted

## 2022-03-28 NOTE — Telephone Encounter (Addendum)
Pt given lab results per notes of J. Cannady, NP from 03/25/22 on 03/28/22. Pt verbalized understanding of labs results and reports ear still muffled but better. Will continue cholesterol medication and review issues regarding ear at appt 04/08/22.

## 2022-04-02 NOTE — Addendum Note (Signed)
Addended by: Marnee Guarneri T on: 04/02/2022 12:26 PM   Modules accepted: Level of Service

## 2022-04-03 ENCOUNTER — Ambulatory Visit: Payer: Self-pay

## 2022-04-03 NOTE — Telephone Encounter (Addendum)
Pt says she has finished her medication for her ears and the pain has returned. She wants to know if her PCP will call her in some more medication because she does not see her again   [facility]  [place] [address]  Phone: [phone] Fax: [phone]   Chief Complaint: Bilateral ear pain,fullness, cough, runny nose. Got a little better on antibiotic. Feeling bad. Return appointment is 04/08/22. Asking for more medication until appointment. Symptoms: Above Frequency: 2 weeks ago Pertinent Negatives: Patient denies fever Disposition: '[]'$ ED /'[]'$ Urgent Care (no appt availability in office) / '[]'$ Appointment(In office/virtual)/ '[]'$  [facility]/ '[]'$ Home Care/ '[]'$ Refused Recommended Disposition /'[]'$  Mobile Bus/ '[x]'$  Follow-up with PCP Additional Notes: Please advise pt. Thanks.  Answer Assessment - Initial Assessment Questions 1. LOCATION: "Which ear is involved?"     Both ears 2. ONSET: "When did the ear start hurting"      2 weeks ago 3. SEVERITY: "How bad is the pain?"  (Scale 1-10; mild, moderate or severe)   - MILD (1-3): doesn't interfere with normal activities    - MODERATE (4-7): interferes with normal activities or awakens from sleep    - SEVERE (8-10): excruciating pain, unable to do any normal activities      4-5 4. URI SYMPTOMS: "Do you have a runny nose or cough?"     Cough, runny nose 5. FEVER: "Do you have a fever?" If Yes, ask: "What is your temperature, how was it measured, and when did it start?"     No 6. CAUSE: "Have you been swimming recently?", "How often do you use Q-TIPS?", "Have you had any recent air travel or scuba diving?"     Infection 7. OTHER SYMPTOMS: "Do you have any other symptoms?" (e.g., headache, stiff neck, dizziness, vomiting, runny nose, decreased hearing)     Yes 8. PREGNANCY: "Is there any chance you are pregnant?" "When was your last menstrual period?"     No  Protocols  used: Bethann Punches

## 2022-04-06 NOTE — Patient Instructions (Incomplete)
Get Debrox over the counter and place ear drops 2 hours before seeing me in 4 weeks.  Place ear drops in both ears as instructed on package.  Ear Irrigation Ear irrigation is a procedure to wash dirt and wax out of your ear canal. This procedure is also called lavage. You may need ear irrigation if you are having trouble hearing because of a buildup of earwax. You may also have ear irrigation as part of the treatment for an ear infection. Getting wax and dirt out of your ear canal can help ear drops work better. Tell a health care provider about: Any allergies you have. All medicines you are taking, including vitamins, herbs, eye drops, creams, and over-the-counter medicines. Any problems you or family members have had with anesthetic medicines. Any blood disorders you have. Any surgeries you have had. This includes any ear surgeries. Any medical conditions you have. Whether you are pregnant or may be pregnant. What are the risks? Generally, this is a safe procedure. However, problems may occur, including: Infection. Pain. Hearing loss. Fluid and debris being pushed through the eardrum and into the middle ear. This can occur if there are holes in the eardrum. Ear irrigation failing to work. What happens before the procedure? You will talk with your provider about the procedure and plan. You may be given ear drops to put in your ear 15-20 minutes before irrigation. This helps loosen the wax. What happens during the procedure?  A syringe is filled with water or saline solution, which is made of salt and water. The syringe is gently inserted into the ear canal. The fluid is used to flush out wax and other debris. The procedure may vary among health care providers and hospitals. What can I expect after the procedure? After an ear irrigation, follow instructions given to you by your health care provider. Follow these instructions at home: Using ear irrigation kits Ear irrigation kits are  available for use at home. Ask your health care provider if this is an option for you. In general, you should: Use a home irrigation kit only as told by your health care provider. Read the package instructions carefully. Follow the directions for using the syringe. Use water that is room temperature. Do not do ear irrigation at home if you: Have diabetes. Diabetes increases the risk of infection. Have a hole or tear in your eardrum. Have tubes in your ears. Have had any ear surgery in the past. Have been told not to irrigate your ears. Cleaning your ears  Clean the outside of your ear with a soft washcloth daily. If told by your health care provider, use a few drops of baby oil, mineral oil, glycerin, hydrogen peroxide, or over-the-counter earwax softening drops. Do not use cotton swabs to clean your ears. These can push wax down into the ear canal. Do not put anything into your ears to try to remove wax. This includes ear candles. General instructions Take over-the-counter and prescription medicines only as told by your health care provider. If you were prescribed an antibiotic medicine, use it as told by your health care provider. Do not stop using the antibiotic even if your condition improves. Keep the ear clean and dry by following the instructions from your health care provider. Keep all follow-up visits. This is important. Visit your health care provider at least once a year to have your ears and hearing checked. Contact a health care provider if: Your hearing is not improving or is getting worse. You  have pain or redness in your ear. You are dizzy. You have ringing in your ears. You have nausea or vomiting. You have fluid, blood, or pus coming out of your ear. Summary Ear irrigation is a procedure to wash dirt and wax out of your ear canal. This procedure is also called lavage. To perform ear irrigation, ear drops may be put in your ear 15-20 minutes before irrigation. Water  or saline solution will be used to flush out earwax and other debris. You may be able to irrigate your ears at home. Ask your health care provider if this is an option for you. Follow your health care provider's instructions. Clean your ears with a soft cloth after irrigation. Do not use cotton swabs to clean your ears. These can push wax down into the ear canal. This information is not intended to replace advice given to you by your health care provider. Make sure you discuss any questions you have with your health care provider. Document Revised: 07/19/2019 Document Reviewed: 07/19/2019 Elsevier Patient Education  [employer].

## 2022-04-08 ENCOUNTER — Encounter: Payer: Self-pay | Admitting: Nurse Practitioner

## 2022-04-08 ENCOUNTER — Ambulatory Visit (INDEPENDENT_AMBULATORY_CARE_PROVIDER_SITE_OTHER): Payer: Medicare Other | Admitting: Nurse Practitioner

## 2022-04-08 VITALS — BP 138/87 | HR 89 | Temp 97.6°F | Ht 60.0 in | Wt 103.5 lb

## 2022-04-08 DIAGNOSIS — H6501 Acute serous otitis media, right ear: Secondary | ICD-10-CM | POA: Diagnosis not present

## 2022-04-08 NOTE — Assessment & Plan Note (Signed)
Acute and improved at this time.  However, cerumen in bilateral ears L>R.  Would benefit irrigation, but will plan for this in 4 weeks with Debrox placed to both ears prior to irrigation to help soften cerumen.  Discussed at length with patient.

## 2022-04-08 NOTE — Progress Notes (Signed)
BP 138/87   Pulse 89   Temp 97.6 F (36.4 C) (Oral)   Ht 5' (1.524 m)   Wt 103 lb 8 oz (46.9 kg)   SpO2 96%   BMI 20.21 kg/m    Subjective:    Patient ID: Lauren Nolan, female    DOB: 10-25-1945, 76 y.o.   MRN: [id number]  HPI: Lauren Nolan is a 76 y.o. female  Chief Complaint  Patient presents with   Ear Check    Right ear recheck. Patient states that her ear is better but feels like something is in there.    EARS CLOGGED Presents today for right ear check, started on Augmentin on 03/26/22.  Continues to have some muffled hearing, little bit. Started after irrigation of ear for moderate cerumen.  Currently pain has improved.   Duration: days Involved ear(s): right Severity: 0/10 Quality:  dull Fever: no Otorrhea: no Upper respiratory infection symptoms: no Pruritus: no Hearing loss: improving Water immersion yes Using Q-tips: yes Recurrent otitis media: no Status: stable Treatments attempted: none   Relevant past medical, surgical, family and social history reviewed and updated as indicated. Interim medical history since our last visit reviewed. Allergies and medications reviewed and updated.  Review of Systems  Constitutional:  Negative for activity change, appetite change, diaphoresis, fatigue and fever.  HENT:  Positive for hearing loss. Negative for ear discharge and ear pain.   Respiratory:  Negative for cough, chest tightness and shortness of breath.   Cardiovascular:  Negative for chest pain, palpitations and leg swelling.  Neurological: Negative.   Psychiatric/Behavioral: Negative.      Per HPI unless specifically indicated above     Objective:    BP 138/87   Pulse 89   Temp 97.6 F (36.4 C) (Oral)   Ht 5' (1.524 m)   Wt 103 lb 8 oz (46.9 kg)   SpO2 96%   BMI 20.21 kg/m   Wt Readings from Last 3 Encounters:  04/08/22 103 lb 8 oz (46.9 kg)  03/26/22 103 lb 14.4 oz (47.1 kg)  03/24/22 103 lb 8 oz (46.9 kg)    Physical Exam Vitals  and nursing note reviewed.  Constitutional:      General: She is awake. She is not in acute distress.    Appearance: She is well-developed and well-groomed. She is not ill-appearing or toxic-appearing.  HENT:     Head: Normocephalic.     Right Ear: External ear normal. Decreased hearing noted. No middle ear effusion. There is impacted cerumen (minimal, able to view TM). Tympanic membrane is not injected or perforated.     Left Ear: External ear normal. Decreased hearing noted.  No middle ear effusion. There is impacted cerumen. Tympanic membrane is not injected or perforated.     Ears:     Comments: Does not want irrigation today, plan for future visit.    Nose: Nose normal.     Mouth/Throat:     Mouth: Mucous membranes are moist.  Eyes:     General: Lids are normal.        Right eye: No discharge.        Left eye: No discharge.     Conjunctiva/sclera: Conjunctivae normal.     Pupils: Pupils are equal, round, and reactive to light.  Neck:     Thyroid: No thyromegaly.     Vascular: No carotid bruit or JVD.  Cardiovascular:     Rate and Rhythm: Normal rate and regular rhythm.  Heart sounds: Normal heart sounds. No murmur heard.    No gallop.  Pulmonary:     Effort: Pulmonary effort is normal.     Breath sounds: Normal breath sounds.  Abdominal:     General: Bowel sounds are normal.     Palpations: Abdomen is soft.  Musculoskeletal:     Cervical back: Normal range of motion and neck supple.     Right lower leg: No edema.     Left lower leg: No edema.  Lymphadenopathy:     Cervical: No cervical adenopathy.  Skin:    General: Skin is warm and dry.  Neurological:     Mental Status: She is alert and oriented to person, place, and time.  Psychiatric:        Attention and Perception: Attention normal.        Mood and Affect: Mood normal.        Behavior: Behavior normal. Behavior is cooperative.        Thought Content: Thought content normal.        Judgment: Judgment normal.     Results for orders placed or performed in visit on 03/24/22  CBC with Differential/Platelet  Result Value Ref Range   WBC 8.7 3.4 - 10.8 x10E3/uL   RBC 4.38 3.77 - 5.28 x10E6/uL   Hemoglobin 14.7 11.1 - 15.9 g/dL   Hematocrit 43.0 34.0 - 46.6 %   MCV 98 (H) 79 - 97 fL   MCH 33.6 (H) 26.6 - 33.0 pg   MCHC 34.2 31.5 - 35.7 g/dL   RDW 11.3 (L) 11.7 - 15.4 %   Platelets 285 150 - 450 x10E3/uL   Neutrophils 60 Not Estab. %   Lymphs 26 Not Estab. %   Monocytes 10 Not Estab. %   Eos 2 Not Estab. %   Basos 2 Not Estab. %   Neutrophils Absolute 5.2 1.4 - 7.0 x10E3/uL   Lymphocytes Absolute 2.3 0.7 - 3.1 x10E3/uL   Monocytes Absolute 0.9 0.1 - 0.9 x10E3/uL   EOS (ABSOLUTE) 0.2 0.0 - 0.4 x10E3/uL   Basophils Absolute 0.2 0.0 - 0.2 x10E3/uL   Immature Granulocytes 0 Not Estab. %   Immature Grans (Abs) 0.0 0.0 - 0.1 x10E3/uL  Comprehensive metabolic panel  Result Value Ref Range   Glucose 73 70 - 99 mg/dL   BUN 12 8 - 27 mg/dL   Creatinine, Ser 0.83 0.57 - 1.00 mg/dL   eGFR 73 >59 mL/min/1.73   BUN/Creatinine Ratio 14 12 - 28   Sodium 140 134 - 144 mmol/L   Potassium 4.2 3.5 - 5.2 mmol/L   Chloride 100 96 - 106 mmol/L   CO2 25 20 - 29 mmol/L   Calcium 9.7 8.7 - 10.3 mg/dL   Total Protein 6.9 6.0 - 8.5 g/dL   Albumin 4.7 3.8 - 4.8 g/dL   Globulin, Total 2.2 1.5 - 4.5 g/dL   Albumin/Globulin Ratio 2.1 1.2 - 2.2   Bilirubin Total 0.6 0.0 - 1.2 mg/dL   Alkaline Phosphatase 67 44 - 121 IU/L   AST 33 0 - 40 IU/L   ALT 23 0 - 32 IU/L  Lipid Panel w/o Chol/HDL Ratio  Result Value Ref Range   Cholesterol, Total 158 100 - 199 mg/dL   Triglycerides 81 0 - 149 mg/dL   HDL 107 >39 mg/dL   VLDL Cholesterol Cal 15 5 - 40 mg/dL   LDL Chol Calc (NIH) 36 0 - 99 mg/dL  TSH  Result Value Ref Range  TSH 2.100 0.450 - 4.500 uIU/mL  Magnesium  Result Value Ref Range   Magnesium 1.9 1.6 - 2.3 mg/dL  VITAMIN D 25 Hydroxy (Vit-D Deficiency, Fractures)  Result Value Ref Range   Vit D,  25-Hydroxy 38.5 30.0 - 100.0 ng/mL      Assessment & Plan:   Problem List Items Addressed This Visit       Nervous and Auditory   Otitis media - Primary    Acute and improved at this time.  However, cerumen in bilateral ears L>R.  Would benefit irrigation, but will plan for this in 4 weeks with Debrox placed to both ears prior to irrigation to help soften cerumen.  Discussed at length with patient.        Follow up plan: Return in about 4 weeks (around 05/06/2022) for EAR IRRIGATION BILATERAL.

## 2022-05-03 NOTE — Patient Instructions (Signed)

## 2022-05-06 ENCOUNTER — Ambulatory Visit (INDEPENDENT_AMBULATORY_CARE_PROVIDER_SITE_OTHER): Payer: Medicare Other | Admitting: Unknown Physician Specialty

## 2022-05-06 ENCOUNTER — Encounter: Payer: Self-pay | Admitting: Unknown Physician Specialty

## 2022-05-06 ENCOUNTER — Ambulatory Visit: Payer: Self-pay

## 2022-05-06 VITALS — BP 126/76 | HR 85 | Temp 97.4°F | Ht 60.0 in | Wt 103.0 lb

## 2022-05-06 DIAGNOSIS — H6123 Impacted cerumen, bilateral: Secondary | ICD-10-CM | POA: Diagnosis not present

## 2022-05-06 NOTE — Telephone Encounter (Signed)
  Chief Complaint: ear pain  Symptoms: bilat ears full of wax  Frequency: today  Pertinent Negatives: NA Disposition: '[]'$ ED /'[]'$ Urgent Care (no appt availability in office) / '[]'$ Appointment(In office/virtual)/ '[]'$  [facility]/ '[x]'$ Home Care/ '[]'$ Refused Recommended Disposition /'[]'$ [facility]/ '[]'$  Follow-up with PCP Additional Notes: advised pt from OV note of Cheryl, NP: Instructed to use baby oil or mineral oil bilateral ears. Return in 1 week. Also recommended pt can try Tylenol or Ibuprofen for pain. Pt verbalized understanding.   Summary: left ear discomfort   [facility] [address] [place] [address] Phone: [phone] Fax: [phone] Hours: Not open 24 hours  ----- Message from Oneta Rack sent at 05/06/2022  3:33 PM EST ----- Patient was seen today by Kathrine Haddock, NP and states due to NP "working on" her left her she's experiencing discomfort and would like NP to send in a script today. Patient has a follow up appointment scheduled for 05/14/2022 and would like to feel better prior to appointment.     Reason for Disposition  Earwax removal, questions about  Answer Assessment - Initial Assessment Questions 1. LOCATION: "Which ear is involved?"      Bilat ears  2. SYMPTOMS: "What are the main symptoms?" (e.g., fullness, decreased hearing, itching, discomfort)     Fullness d/t wax buildup  3. ONSET: "When did the  sx  start?"     Today  4. PAIN: "Is there any earache?" "How bad is it?"  (Scale 1-10; or mild, moderate, severe)     Mild to moderate  Protocols used: Earwax-A-AH

## 2022-05-06 NOTE — Progress Notes (Signed)
BP 126/76   Pulse 85   Temp (!) 97.4 F (36.3 C) (Oral)   Ht 5' (1.524 m)   Wt 103 lb (46.7 kg)   BMI 20.12 kg/m    Subjective:    Patient ID: Lauren Nolan, female    DOB: 05/07/1945, 77 y.o.   MRN: [id number]  HPI: Lauren Nolan is a 77 y.o. female  Chief Complaint  Patient presents with   Ear recheck    B/L ear   Ear Fullness  There is pain in both ears. This is a chronic problem. The current episode started more than 1 month ago. The problem occurs constantly. There has been no fever. The patient is experiencing no pain. Pertinent negatives include no coughing or ear discharge. Treatments tried: Debrox. The treatment provided no relief.     Relevant past medical, surgical, family and social history reviewed and updated as indicated. Interim medical history since our last visit reviewed. Allergies and medications reviewed and updated.  Review of Systems  HENT:  Negative for ear discharge.   Respiratory:  Negative for cough.     Per HPI unless specifically indicated above     Objective:    BP 126/76   Pulse 85   Temp (!) 97.4 F (36.3 C) (Oral)   Ht 5' (1.524 m)   Wt 103 lb (46.7 kg)   BMI 20.12 kg/m   Wt Readings from Last 3 Encounters:  05/06/22 103 lb (46.7 kg)  04/08/22 103 lb 8 oz (46.9 kg)  03/26/22 103 lb 14.4 oz (47.1 kg)    Physical Exam Constitutional:      General: She is not in acute distress.    Appearance: Normal appearance. She is well-developed.  HENT:     Head: Normocephalic and atraumatic.     Right Ear: There is impacted cerumen.     Left Ear: There is impacted cerumen.  Eyes:     General: Lids are normal. No scleral icterus.       Right eye: No discharge.        Left eye: No discharge.     Conjunctiva/sclera: Conjunctivae normal.  Cardiovascular:     Rate and Rhythm: Normal rate.  Pulmonary:     Effort: Pulmonary effort is normal.  Abdominal:     Palpations: There is no hepatomegaly or splenomegaly.  Musculoskeletal:         General: Normal range of motion.  Skin:    Coloration: Skin is not pale.     Findings: No rash.  Neurological:     Mental Status: She is alert and oriented to person, place, and time.  Psychiatric:        Behavior: Behavior normal.        Thought Content: Thought content normal.        Judgment: Judgment normal.    Irrigated with partial results right ear and still impacted left  Results for orders placed or performed in visit on 03/24/22  CBC with Differential/Platelet  Result Value Ref Range   WBC 8.7 3.4 - 10.8 x10E3/uL   RBC 4.38 3.77 - 5.28 x10E6/uL   Hemoglobin 14.7 11.1 - 15.9 g/dL   Hematocrit 43.0 34.0 - 46.6 %   MCV 98 (H) 79 - 97 fL   MCH 33.6 (H) 26.6 - 33.0 pg   MCHC 34.2 31.5 - 35.7 g/dL   RDW 11.3 (L) 11.7 - 15.4 %   Platelets 285 150 - 450 x10E3/uL   Neutrophils 60 Not  Estab. %   Lymphs 26 Not Estab. %   Monocytes 10 Not Estab. %   Eos 2 Not Estab. %   Basos 2 Not Estab. %   Neutrophils Absolute 5.2 1.4 - 7.0 x10E3/uL   Lymphocytes Absolute 2.3 0.7 - 3.1 x10E3/uL   Monocytes Absolute 0.9 0.1 - 0.9 x10E3/uL   EOS (ABSOLUTE) 0.2 0.0 - 0.4 x10E3/uL   Basophils Absolute 0.2 0.0 - 0.2 x10E3/uL   Immature Granulocytes 0 Not Estab. %   Immature Grans (Abs) 0.0 0.0 - 0.1 x10E3/uL  Comprehensive metabolic panel  Result Value Ref Range   Glucose 73 70 - 99 mg/dL   BUN 12 8 - 27 mg/dL   Creatinine, Ser 0.83 0.57 - 1.00 mg/dL   eGFR 73 >59 mL/min/1.73   BUN/Creatinine Ratio 14 12 - 28   Sodium 140 134 - 144 mmol/L   Potassium 4.2 3.5 - 5.2 mmol/L   Chloride 100 96 - 106 mmol/L   CO2 25 20 - 29 mmol/L   Calcium 9.7 8.7 - 10.3 mg/dL   Total Protein 6.9 6.0 - 8.5 g/dL   Albumin 4.7 3.8 - 4.8 g/dL   Globulin, Total 2.2 1.5 - 4.5 g/dL   Albumin/Globulin Ratio 2.1 1.2 - 2.2   Bilirubin Total 0.6 0.0 - 1.2 mg/dL   Alkaline Phosphatase 67 44 - 121 IU/L   AST 33 0 - 40 IU/L   ALT 23 0 - 32 IU/L  Lipid Panel w/o Chol/HDL Ratio  Result Value Ref Range    Cholesterol, Total 158 100 - 199 mg/dL   Triglycerides 81 0 - 149 mg/dL   HDL 107 >39 mg/dL   VLDL Cholesterol Cal 15 5 - 40 mg/dL   LDL Chol Calc (NIH) 36 0 - 99 mg/dL  TSH  Result Value Ref Range   TSH 2.100 0.450 - 4.500 uIU/mL  Magnesium  Result Value Ref Range   Magnesium 1.9 1.6 - 2.3 mg/dL  VITAMIN D 25 Hydroxy (Vit-D Deficiency, Fractures)  Result Value Ref Range   Vit D, 25-Hydroxy 38.5 30.0 - 100.0 ng/mL      Assessment & Plan:   Problem List Items Addressed This Visit   None Visit Diagnoses     Bilateral impacted cerumen    -  Primary   Unsatisfactory results in office.  Instructed to use baby oil or mineral oil bilateral ears.  Return in 1 week.  Consider ENT referral        Follow up plan: Return in about 1 week (around 05/13/2022).

## 2022-05-10 NOTE — Patient Instructions (Signed)

## 2022-05-14 ENCOUNTER — Ambulatory Visit (INDEPENDENT_AMBULATORY_CARE_PROVIDER_SITE_OTHER): Payer: Medicare Other | Admitting: Nurse Practitioner

## 2022-05-14 ENCOUNTER — Encounter: Payer: Self-pay | Admitting: Nurse Practitioner

## 2022-05-14 VITALS — BP 127/74 | HR 87 | Temp 97.6°F | Ht 60.0 in | Wt 103.3 lb

## 2022-05-14 DIAGNOSIS — H6123 Impacted cerumen, bilateral: Secondary | ICD-10-CM

## 2022-05-14 DIAGNOSIS — H6120 Impacted cerumen, unspecified ear: Secondary | ICD-10-CM | POA: Insufficient documentation

## 2022-05-14 NOTE — Assessment & Plan Note (Signed)
Overall ears much improved from previous visits, scant cerumen present L>R.  Irrigated by provider with removal of majority cerumen remaining.  Tolerated well.  Recommend she continue ear drops at home as needed, Debrox, and avoid ear plugs and Q-tips.

## 2022-05-14 NOTE — Progress Notes (Signed)
BP 127/74   Pulse 87   Temp 97.6 F (36.4 C) (Oral)   Ht 5' (1.524 m)   Wt 103 lb 4.8 oz (46.9 kg)   SpO2 98%   BMI 20.17 kg/m    Subjective:    Patient ID: Lauren Nolan, female    DOB: May 02, 1945, 77 y.o.   MRN: [id number]  HPI: KINSLEA FRANCES is a 77 y.o. female  Chief Complaint  Patient presents with   Ear recheck   CERUMEN IMPACTION Ongoing issues with cerumen impaction -- initial attempt to clear on 03/24/22, then returned on 05/06/22 and attempt to clear with irrigation.  Continues to have moderate cerumen in ears bilateral.  Has stopped wearing ear plugs frequently.  Placed ear drops in as instructed prior to visit today. Duration: months Involved ear(s): bilateral Fever: no Otorrhea: no Upper respiratory infection symptoms: no Pruritus: no Hearing loss: no Water immersion no Using Q-tips: no - stopped using Recurrent otitis media: no Status: fluctuating Treatments attempted: none   Relevant past medical, surgical, family and social history reviewed and updated as indicated. Interim medical history since our last visit reviewed. Allergies and medications reviewed and updated.  Review of Systems  Constitutional:  Negative for activity change, appetite change, diaphoresis, fatigue and fever.  HENT:  Negative for ear discharge, ear pain and hearing loss.   Respiratory:  Negative for cough, chest tightness and shortness of breath.   Cardiovascular:  Negative for chest pain, palpitations and leg swelling.  Neurological: Negative.   Psychiatric/Behavioral: Negative.      Per HPI unless specifically indicated above     Objective:    BP 127/74   Pulse 87   Temp 97.6 F (36.4 C) (Oral)   Ht 5' (1.524 m)   Wt 103 lb 4.8 oz (46.9 kg)   SpO2 98%   BMI 20.17 kg/m   Wt Readings from Last 3 Encounters:  05/14/22 103 lb 4.8 oz (46.9 kg)  05/06/22 103 lb (46.7 kg)  04/08/22 103 lb 8 oz (46.9 kg)    Physical Exam Vitals and nursing note reviewed.   Constitutional:      General: She is awake. She is not in acute distress.    Appearance: She is well-developed and well-groomed. She is not ill-appearing or toxic-appearing.  HENT:     Head: Normocephalic.     Right Ear: External ear normal. No decreased hearing noted. No middle ear effusion. There is no impacted cerumen. Tympanic membrane is not injected or perforated.     Left Ear: External ear normal. No decreased hearing noted.  No middle ear effusion. There is no impacted cerumen. Tympanic membrane is not injected or perforated.     Ears:     Comments: Overall ears much improved from previous visits, scant cerumen present L>R.  Irrigated by provider with removal of majority cerumen remaining.      Nose: Nose normal.     Mouth/Throat:     Mouth: Mucous membranes are moist.  Eyes:     General: Lids are normal.        Right eye: No discharge.        Left eye: No discharge.     Conjunctiva/sclera: Conjunctivae normal.     Pupils: Pupils are equal, round, and reactive to light.  Neck:     Thyroid: No thyromegaly.     Vascular: No carotid bruit or JVD.  Cardiovascular:     Rate and Rhythm: Normal rate and regular rhythm.  Heart sounds: Normal heart sounds. No murmur heard.    No gallop.  Pulmonary:     Effort: Pulmonary effort is normal.     Breath sounds: Normal breath sounds.  Abdominal:     General: Bowel sounds are normal.     Palpations: Abdomen is soft.  Musculoskeletal:     Cervical back: Normal range of motion and neck supple.     Right lower leg: No edema.     Left lower leg: No edema.  Lymphadenopathy:     Cervical: No cervical adenopathy.  Skin:    General: Skin is warm and dry.  Neurological:     Mental Status: She is alert and oriented to person, place, and time.  Psychiatric:        Attention and Perception: Attention normal.        Mood and Affect: Mood normal.        Behavior: Behavior normal. Behavior is cooperative.        Thought Content: Thought  content normal.        Judgment: Judgment normal.     Results for orders placed or performed in visit on 03/24/22  CBC with Differential/Platelet  Result Value Ref Range   WBC 8.7 3.4 - 10.8 x10E3/uL   RBC 4.38 3.77 - 5.28 x10E6/uL   Hemoglobin 14.7 11.1 - 15.9 g/dL   Hematocrit 43.0 34.0 - 46.6 %   MCV 98 (H) 79 - 97 fL   MCH 33.6 (H) 26.6 - 33.0 pg   MCHC 34.2 31.5 - 35.7 g/dL   RDW 11.3 (L) 11.7 - 15.4 %   Platelets 285 150 - 450 x10E3/uL   Neutrophils 60 Not Estab. %   Lymphs 26 Not Estab. %   Monocytes 10 Not Estab. %   Eos 2 Not Estab. %   Basos 2 Not Estab. %   Neutrophils Absolute 5.2 1.4 - 7.0 x10E3/uL   Lymphocytes Absolute 2.3 0.7 - 3.1 x10E3/uL   Monocytes Absolute 0.9 0.1 - 0.9 x10E3/uL   EOS (ABSOLUTE) 0.2 0.0 - 0.4 x10E3/uL   Basophils Absolute 0.2 0.0 - 0.2 x10E3/uL   Immature Granulocytes 0 Not Estab. %   Immature Grans (Abs) 0.0 0.0 - 0.1 x10E3/uL  Comprehensive metabolic panel  Result Value Ref Range   Glucose 73 70 - 99 mg/dL   BUN 12 8 - 27 mg/dL   Creatinine, Ser 0.83 0.57 - 1.00 mg/dL   eGFR 73 >59 mL/min/1.73   BUN/Creatinine Ratio 14 12 - 28   Sodium 140 134 - 144 mmol/L   Potassium 4.2 3.5 - 5.2 mmol/L   Chloride 100 96 - 106 mmol/L   CO2 25 20 - 29 mmol/L   Calcium 9.7 8.7 - 10.3 mg/dL   Total Protein 6.9 6.0 - 8.5 g/dL   Albumin 4.7 3.8 - 4.8 g/dL   Globulin, Total 2.2 1.5 - 4.5 g/dL   Albumin/Globulin Ratio 2.1 1.2 - 2.2   Bilirubin Total 0.6 0.0 - 1.2 mg/dL   Alkaline Phosphatase 67 44 - 121 IU/L   AST 33 0 - 40 IU/L   ALT 23 0 - 32 IU/L  Lipid Panel w/o Chol/HDL Ratio  Result Value Ref Range   Cholesterol, Total 158 100 - 199 mg/dL   Triglycerides 81 0 - 149 mg/dL   HDL 107 >39 mg/dL   VLDL Cholesterol Cal 15 5 - 40 mg/dL   LDL Chol Calc (NIH) 36 0 - 99 mg/dL  TSH  Result Value Ref Range  TSH 2.100 0.450 - 4.500 uIU/mL  Magnesium  Result Value Ref Range   Magnesium 1.9 1.6 - 2.3 mg/dL  VITAMIN D 25 Hydroxy (Vit-D  Deficiency, Fractures)  Result Value Ref Range   Vit D, 25-Hydroxy 38.5 30.0 - 100.0 ng/mL      Assessment & Plan:   Problem List Items Addressed This Visit       Nervous and Auditory   Cerumen impaction - Primary    Overall ears much improved from previous visits, scant cerumen present L>R.  Irrigated by provider with removal of majority cerumen remaining.  Tolerated well.  Recommend she continue ear drops at home as needed, Debrox, and avoid ear plugs and Q-tips.        Follow up plan: Return for as scheduled June 11th.

## 2022-05-31 DIAGNOSIS — S93401A Sprain of unspecified ligament of right ankle, initial encounter: Secondary | ICD-10-CM | POA: Diagnosis not present

## 2022-05-31 DIAGNOSIS — W1840XA Slipping, tripping and stumbling without falling, unspecified, initial encounter: Secondary | ICD-10-CM | POA: Diagnosis not present

## 2022-07-29 ENCOUNTER — Other Ambulatory Visit: Payer: Self-pay | Admitting: Nurse Practitioner

## 2022-07-29 MED ORDER — BUSPIRONE HCL 5 MG PO TABS: 5.0000 mg | ORAL_TABLET | Freq: Two times a day (BID) | ORAL | 3 refills | Status: DC

## 2022-07-30 ENCOUNTER — Telehealth: Payer: Self-pay | Admitting: Nurse Practitioner

## 2022-07-30 NOTE — Telephone Encounter (Signed)
Contacted Lauren Nolan Beg to schedule their annual wellness visit. Appointment made for 08/18/2022.  Lauren Nolan; Care Guide Ambulatory Clinical Support [place] [facility] Direct Dial: [phone]

## 2022-08-01 ENCOUNTER — Other Ambulatory Visit: Payer: Self-pay | Admitting: Nurse Practitioner

## 2022-08-01 MED ORDER — BUSPIRONE HCL 5 MG PO TABS: 10.0000 mg | ORAL_TABLET | Freq: Two times a day (BID) | ORAL | 3 refills | Status: DC

## 2022-08-18 ENCOUNTER — Ambulatory Visit (INDEPENDENT_AMBULATORY_CARE_PROVIDER_SITE_OTHER): Payer: Medicare Other

## 2022-08-18 VITALS — Ht 60.0 in | Wt 103.0 lb

## 2022-08-18 DIAGNOSIS — Z0000 Encounter for general adult medical examination without abnormal findings: Secondary | ICD-10-CM | POA: Diagnosis not present

## 2022-08-18 NOTE — Patient Instructions (Signed)
Lauren Nolan , Thank you for taking time to come for your Medicare Wellness Visit. I appreciate your ongoing commitment to your health goals. Please review the following plan we discussed and let me know if I can assist you in the future.   These are the goals we discussed:  Goals      DIET - EAT MORE FRUITS AND VEGETABLES     Patient Stated     08/13/2020, no goals     Patient Stated     Spend more quality time together with husband     Quit Smoking     Smoking cessation discussed        This is a list of the screening recommended for you and due dates:  Health Maintenance  Topic Date Due   COVID-19 Vaccine (5 - 2023-24 season) 06/17/2022   DEXA scan (bone density measurement)  07/18/2022   Colon Cancer Screening  10/03/2022   Flu Shot  11/13/2022   Medicare Annual Wellness Visit  08/18/2023   DTaP/Tdap/Td vaccine (3 - Td or Tdap) 10/03/2030   Pneumonia Vaccine  Completed   Hepatitis C Screening: USPSTF Recommendation to screen - Ages 84-79 yo.  Completed   Zoster (Shingles) Vaccine  Completed   HPV Vaccine  Aged Out    Advanced directives: no  Conditions/risks identified: none  Next appointment: Follow up in one year for your annual wellness visit 08/24/23 @ 9:45 am by phone   Preventive Care 65 Years and Older, Female Preventive care refers to lifestyle choices and visits with your health care provider that can promote health and wellness. What does preventive care include? A yearly physical exam. This is also called an annual well check. Dental exams once or twice a year. Routine eye exams. Ask your health care provider how often you should have your eyes checked. Personal lifestyle choices, including: Daily care of your teeth and gums. Regular physical activity. Eating a healthy diet. Avoiding tobacco and drug use. Limiting alcohol use. Practicing safe sex. Taking low-dose aspirin every day. Taking vitamin and mineral supplements as recommended by your health  care provider. What happens during an annual well check? The services and screenings done by your health care provider during your annual well check will depend on your age, overall health, lifestyle risk factors, and family history of disease. Counseling  Your health care provider may ask you questions about your: Alcohol use. Tobacco use. Drug use. Emotional well-being. Home and relationship well-being. Sexual activity. Eating habits. History of falls. Memory and ability to understand (cognition). Work and work Astronomer. Reproductive health. Screening  You may have the following tests or measurements: Height, weight, and BMI. Blood pressure. Lipid and cholesterol levels. These may be checked every 5 years, or more frequently if you are over 61 years old. Skin check. Lung cancer screening. You may have this screening every year starting at age 48 if you have a 30-pack-year history of smoking and currently smoke or have quit within the past 15 years. Fecal occult blood test (FOBT) of the stool. You may have this test every year starting at age 34. Flexible sigmoidoscopy or colonoscopy. You may have a sigmoidoscopy every 5 years or a colonoscopy every 10 years starting at age 5. Hepatitis C blood test. Hepatitis B blood test. Sexually transmitted disease (STD) testing. Diabetes screening. This is done by checking your blood sugar (glucose) after you have not eaten for a while (fasting). You may have this done every 1-3 years. Bone density scan.  This is done to screen for osteoporosis. You may have this done starting at age 22. Mammogram. This may be done every 1-2 years. Talk to your health care provider about how often you should have regular mammograms. Talk with your health care provider about your test results, treatment options, and if necessary, the need for more tests. Vaccines  Your health care provider may recommend certain vaccines, such as: Influenza vaccine. This is  recommended every year. Tetanus, diphtheria, and acellular pertussis (Tdap, Td) vaccine. You may need a Td booster every 10 years. Zoster vaccine. You may need this after age 88. Pneumococcal 13-valent conjugate (PCV13) vaccine. One dose is recommended after age 53. Pneumococcal polysaccharide (PPSV23) vaccine. One dose is recommended after age 56. Talk to your health care provider about which screenings and vaccines you need and how often you need them. This information is not intended to replace advice given to you by your health care provider. Make sure you discuss any questions you have with your health care provider. Document Released: 04/27/2015 Document Revised: 12/19/2015 Document Reviewed: 01/30/2015 Elsevier Interactive Patient Education  2017 [employer].  Fall Prevention in the Home Falls can cause injuries. They can happen to people of all ages. There are many things you can do to make your home safe and to help prevent falls. What can I do on the outside of my home? Regularly fix the edges of walkways and driveways and fix any cracks. Remove anything that might make you trip as you walk through a door, such as a raised step or threshold. Trim any bushes or trees on the path to your home. Use bright outdoor lighting. Clear any walking paths of anything that might make someone trip, such as rocks or tools. Regularly check to see if handrails are loose or broken. Make sure that both sides of any steps have handrails. Any raised decks and porches should have guardrails on the edges. Have any leaves, snow, or ice cleared regularly. Use sand or salt on walking paths during winter. Clean up any spills in your garage right away. This includes oil or grease spills. What can I do in the bathroom? Use night lights. Install grab bars by the toilet and in the tub and shower. Do not use towel bars as grab bars. Use non-skid mats or decals in the tub or shower. If you need to sit down in  the shower, use a plastic, non-slip stool. Keep the floor dry. Clean up any water that spills on the floor as soon as it happens. Remove soap buildup in the tub or shower regularly. Attach bath mats securely with double-sided non-slip rug tape. Do not have throw rugs and other things on the floor that can make you trip. What can I do in the bedroom? Use night lights. Make sure that you have a light by your bed that is easy to reach. Do not use any sheets or blankets that are too big for your bed. They should not hang down onto the floor. Have a firm chair that has side arms. You can use this for support while you get dressed. Do not have throw rugs and other things on the floor that can make you trip. What can I do in the kitchen? Clean up any spills right away. Avoid walking on wet floors. Keep items that you use a lot in easy-to-reach places. If you need to reach something above you, use a strong step stool that has a grab bar. Keep electrical cords  out of the way. Do not use floor polish or wax that makes floors slippery. If you must use wax, use non-skid floor wax. Do not have throw rugs and other things on the floor that can make you trip. What can I do with my stairs? Do not leave any items on the stairs. Make sure that there are handrails on both sides of the stairs and use them. Fix handrails that are broken or loose. Make sure that handrails are as long as the stairways. Check any carpeting to make sure that it is firmly attached to the stairs. Fix any carpet that is loose or worn. Avoid having throw rugs at the top or bottom of the stairs. If you do have throw rugs, attach them to the floor with carpet tape. Make sure that you have a light switch at the top of the stairs and the bottom of the stairs. If you do not have them, ask someone to add them for you. What else can I do to help prevent falls? Wear shoes that: Do not have high heels. Have rubber bottoms. Are comfortable  and fit you well. Are closed at the toe. Do not wear sandals. If you use a stepladder: Make sure that it is fully opened. Do not climb a closed stepladder. Make sure that both sides of the stepladder are locked into place. Ask someone to hold it for you, if possible. Clearly mark and make sure that you can see: Any grab bars or handrails. First and last steps. Where the edge of each step is. Use tools that help you move around (mobility aids) if they are needed. These include: Canes. Walkers. Scooters. Crutches. Turn on the lights when you go into a dark area. Replace any light bulbs as soon as they burn out. Set up your furniture so you have a clear path. Avoid moving your furniture around. If any of your floors are uneven, fix them. If there are any pets around you, be aware of where they are. Review your medicines with your doctor. Some medicines can make you feel dizzy. This can increase your chance of falling. Ask your doctor what other things that you can do to help prevent falls. This information is not intended to replace advice given to you by your health care provider. Make sure you discuss any questions you have with your health care provider. Document Released: 01/25/2009 Document Revised: 09/06/2015 Document Reviewed: 05/05/2014 Elsevier Interactive Patient Education  2017 [employer].

## 2022-08-18 NOTE — Progress Notes (Signed)
I connected with  Lauren Nolan on 08/18/22 by a audio enabled telemedicine application and verified that I am speaking with the correct person using two identifiers.  Patient Location: Home  Provider Location: Office/Clinic  I discussed the limitations of evaluation and management by telemedicine. The patient expressed understanding and agreed to proceed.  Subjective:   Lauren Nolan is a 77 y.o. female who presents for Medicare Annual (Subsequent) preventive examination.  Review of Systems     Cardiac Risk Factors include: advanced age (>72men, >63 women);hypertension;family history of premature cardiovascular disease     Objective:    There were no vitals filed for this visit. There is no height or weight on file to calculate BMI.     08/18/2022   10:17 AM 02/08/2022    2:58 PM 08/15/2021    9:34 AM 08/13/2020    9:50 AM 10/03/2019    9:11 AM 06/07/2018    9:30 AM 05/07/2016   10:06 AM  Advanced Directives  Does Patient Have a Medical Advance Directive? No No Yes Yes No No No  Type of Advance Directive   Living will Living will;Healthcare Power of Attorney     Copy of Healthcare Power of Attorney in Chart?    No - copy requested     Would patient like information on creating a medical advance directive? No - Patient declined No - Patient declined No - Patient declined  No - Patient declined No - Patient declined     Current Medications (verified) Outpatient Encounter Medications as of 08/18/2022  Medication Sig   aspirin 81 MG tablet Take 81 mg by mouth daily.   busPIRone (BUSPAR) 5 MG tablet Take 2 tablets (10 mg total) by mouth 2 (two) times daily.   olmesartan (BENICAR) 20 MG tablet Take 1 tablet (20 mg total) by mouth daily.   omeprazole (PRILOSEC) 20 MG capsule Take 1 capsule (20 mg total) by mouth daily.   rosuvastatin (CRESTOR) 5 MG tablet Take 1 tablet (5 mg total) by mouth daily.   Turmeric (QC TUMERIC COMPLEX PO) Take by mouth.   umeclidinium-vilanterol (ANORO  ELLIPTA) 62.5-25 MCG/ACT AEPB Inhale 1 puff into the lungs daily at 6 (six) AM.   venlafaxine XR (EFFEXOR-XR) 75 MG 24 hr capsule Take 1 capsule (75 mg total) by mouth daily with breakfast.   VITAMIN D, CHOLECALCIFEROL, PO Take by mouth.   albuterol (VENTOLIN HFA) 108 (90 Base) MCG/ACT inhaler Inhale 1-2 puffs into the lungs every 6 (six) hours as needed for wheezing or shortness of breath. (Patient not taking: Reported on 08/18/2022)   alendronate (FOSAMAX) 70 MG tablet Take 1 tablet (70 mg total) by mouth once a week. Take with a full glass of water on an empty stomach. (Patient not taking: Reported on 08/18/2022)   No facility-administered encounter medications on file as of 08/18/2022.    Allergies (verified) Elemental sulfur, Midol [acetaminophen], and Sulfa antibiotics   History: Past Medical History:  Diagnosis Date   Allergy    Anxiety    COPD (chronic obstructive pulmonary disease) (HCC)    Depression    GERD (gastroesophageal reflux disease)    Hypertension    Internal hemorrhoid    Lichen sclerosus    Osteoporosis    Spinal stenosis    Tobacco use    Past Surgical History:  Procedure Laterality Date   BREAST EXCISIONAL BIOPSY Bilateral    1980's - benign   BREAST SURGERY Bilateral    biopsy   COLONOSCOPY WITH  PROPOFOL N/A 10/03/2019   Procedure: COLONOSCOPY WITH PROPOFOL;  Surgeon: Wyline Mood, MD;  Location[facility] ENDOSCOPY;  Service: Gastroenterology;  Laterality: N/A;   EYE SURGERY Bilateral Feb-Mar 2016   HERNIA REPAIR     TUBAL LIGATION     Family History  Problem Relation Age of Onset   Dementia Mother    Anxiety disorder Mother    Hypertension Mother    Epilepsy Mother    Cancer Father        lung   Diabetes Brother    Colon cancer Brother    Hypertension Daughter    Cancer Daughter    Heart disease Maternal Grandfather        MI   Hypertension Brother    Diabetes Daughter    Arthritis Daughter    Breast cancer Neg Hx    Social History    Socioeconomic History   Marital status: Married    Spouse name: Not on file   Number of children: Not on file   Years of education: Not on file   Highest education level: 8th grade  Occupational History   Occupation: retired  Tobacco Use   Smoking status: Every Day    Packs/day: .25    Types: Cigarettes   Smokeless tobacco: Never   Tobacco comments:    1 pack every 3 days   Vaping Use   Vaping Use: Never used  Substance and Sexual Activity   Alcohol use: Yes    Alcohol/week: 3.0 standard drinks of alcohol    Types: 3 Glasses of wine per week   Drug use: No   Sexual activity: Yes  Other Topics Concern   Not on file  Social History Narrative   Retired, has daughter living at home with her and her husband    Social Determinants of Health   Financial Resource Strain: Low Risk  (08/18/2022)   Overall Financial Resource Strain (CARDIA)    Difficulty of Paying Living Expenses: Not hard at all  Food Insecurity: No Food Insecurity (08/18/2022)   Hunger Vital Sign    Worried About Running Out of Food in the Last Year: Never true    Ran Out of Food in the Last Year: Never true  Transportation Needs: No Transportation Needs (08/18/2022)   PRAPARE - Administrator, Civil Service (Medical): No    Lack of Transportation (Non-Medical): No  Physical Activity: Insufficiently Active (08/18/2022)   Exercise Vital Sign    Days of Exercise per Week: 3 days    Minutes of Exercise per Session: 30 min  Stress: Stress Concern Present (08/18/2022)   [employer] of Occupational Health - Occupational Stress Questionnaire    Feeling of Stress : To some extent  Social Connections: Moderately Isolated (08/18/2022)   Social Connection and Isolation Panel [NHANES]    Frequency of Communication with Friends and Family: More than three times a week    Frequency of Social Gatherings with Friends and Family: More than three times a week    Attends Religious Services: Never    Doctor, general practice or Organizations: No    Attends Engineer, structural: Never    Marital Status: Married    Tobacco Counseling Ready to quit: Not Answered Counseling given: Not Answered Tobacco comments: 1 pack every 3 days    Clinical Intake:  Pre-visit preparation completed: Yes  Pain : No/denies pain     Nutritional Risks: None Diabetes: No  How often do you need to have  someone help you when you read instructions, pamphlets, or other written materials from your doctor or pharmacy?: 1 - Never  Diabetic?no  Interpreter Needed?: No  Information entered by :: Kennedy Bucker, LPN   Activities of Daily Living    08/18/2022   10:19 AM 03/24/2022   10:21 AM  In your present state of health, do you have any difficulty performing the following activities:  Hearing? 0 0  Vision? 0 0  Difficulty concentrating or making decisions? 0 0  Walking or climbing stairs? 0 0  Dressing or bathing? 0 0  Doing errands, shopping? 0 0  Preparing Food and eating ? N   Using the Toilet? N   In the past six months, have you accidently leaked urine? N   Do you have problems with loss of bowel control? N   Managing your Medications? N   Managing your Finances? N   Housekeeping or managing your Housekeeping? N     Patient Care Team: Marjie Skiff, NP as PCP - General (Nurse Practitioner) Lockie Mola, MD as Referring Physician (Ophthalmology) Eli Phillips, OD (Optometry)  Indicate any recent Medical Services you may have received from other than [facility] providers in the past year (date may be approximate).     Assessment:   This is a routine wellness examination for Lauren Nolan.  Hearing/Vision screen Hearing Screening - Comments:: No aids Vision Screening - Comments:: Readers- Dr.Katsoudas  Dietary issues and exercise activities discussed: Current Exercise Habits: Home exercise routine, Type of exercise: walking, Time (Minutes): 30, Frequency (Times/Week): 3, Weekly  Exercise (Minutes/Week): 90, Exercise limited by: None identified   Goals Addressed             This Visit's Progress    DIET - EAT MORE FRUITS AND VEGETABLES         Depression Screen    08/18/2022   10:16 AM 05/14/2022   10:12 AM 05/06/2022   10:15 AM 03/24/2022   10:05 AM 02/25/2022    8:07 AM 10/16/2021    3:52 PM 08/15/2021    9:43 AM  PHQ 2/9 Scores  PHQ - 2 Score 0 0 0 0 0 0 0  PHQ- 9 Score 0 0 0 0 0 0 0    Fall Risk    08/18/2022   10:19 AM 05/14/2022   10:12 AM 05/06/2022   10:15 AM 03/24/2022   10:21 AM 03/24/2022   10:04 AM  Fall Risk   Falls in the past year? 0 0 0 0 1  Number falls in past yr: 0 0 0 0 1  Injury with Fall? 0 0 0 0 1  Risk for fall due to : No Fall Risks No Fall Risks No Fall Risks No Fall Risks History of fall(s)  Follow up Falls prevention discussed;Falls evaluation completed Falls evaluation completed Falls evaluation completed Falls prevention discussed Falls evaluation completed    FALL RISK PREVENTION PERTAINING TO THE HOME:  Any stairs in or around the home? Yes  If so, are there any without handrails? No  Home free of loose throw rugs in walkways, pet beds, electrical cords, etc? Yes  Adequate lighting in your home to reduce risk of falls? Yes   ASSISTIVE DEVICES UTILIZED TO PREVENT FALLS:  Life alert? No  Use of a cane, walker or w/c? No  Grab bars in the bathroom? No  Shower chair or bench in shower? No  Elevated toilet seat or a handicapped toilet? No   Cognitive Function:  08/18/2022   10:23 AM 08/15/2021    9:35 AM 08/13/2020    9:55 AM 06/07/2018    9:30 AM  6CIT Screen  What Year? 0 points 0 points 0 points 0 points  What month? 0 points 0 points 0 points 0 points  What time? 0 points 0 points 0 points 0 points  Count back from 20 0 points 0 points 0 points 0 points  Months in reverse 4 points 4 points 4 points 0 points  Repeat phrase 0 points 6 points 10 points 0 points  Total Score 4 points 10 points 14 points 0  points    Immunizations Immunization History  Administered Date(s) Administered   Covid-19, Mrna,Vaccine(Spikevax)7yrs and older 04/22/2022   Fluad Quad(high Dose 65+) 01/07/2019, 01/30/2021, 01/13/2022   Influenza, High Dose Seasonal PF 12/16/2017   Influenza-Unspecified 01/11/2015, 01/11/2016, 12/31/2016, 01/06/2020, 03/03/2022   Moderna Sars-Covid-2 Vaccination 05/10/2019, 06/08/2019, 04/26/2020   Pneumococcal Conjugate-13 08/22/2013   Pneumococcal Polysaccharide-23 07/05/2014   Tdap 06/07/2010, 10/02/2020   Zoster Recombinat (Shingrix) 01/20/2019, 04/25/2019   Zoster, Live 04/11/2008    TDAP status: Up to date  Flu Vaccine status: Up to date  Pneumococcal vaccine status: Up to date  Covid-19 vaccine status: Completed vaccines  Qualifies for Shingles Vaccine? Yes   Zostavax completed Yes   Shingrix Completed?: Yes  Screening Tests Health Maintenance  Topic Date Due   COVID-19 Vaccine (5 - 2023-24 season) 06/17/2022   DEXA SCAN  07/18/2022   COLONOSCOPY (Pts 45-55yrs Insurance coverage will need to be confirmed)  10/03/2022   INFLUENZA VACCINE  11/13/2022   Medicare Annual Wellness (AWV)  08/18/2023   DTaP/Tdap/Td (3 - Td or Tdap) 10/03/2030   Pneumonia Vaccine 62+ Years old  Completed   Hepatitis C Screening  Completed   Zoster Vaccines- Shingrix  Completed   HPV VACCINES  Aged Out    Health Maintenance  Health Maintenance Due  Topic Date Due   COVID-19 Vaccine (5 - 2023-24 season) 06/17/2022   DEXA SCAN  07/18/2022    Colorectal cancer screening: No longer required.   Mammogram status: No longer required due to age.  Bone Density status: Ordered 03/24/22. Pt provided with contact info and advised to call to schedule appt.  Lung Cancer Screening: (Low Dose CT Chest recommended if Age 56-80 years, 30 pack-year currently smoking OR have quit w/in 15years.) does qualify.   Lung Cancer Screening Referral: declined referral  Additional  Screening:  Hepatitis C Screening: does qualify; Completed 05/07/15  Vision Screening: Recommended annual ophthalmology exams for early detection of glaucoma and other disorders of the eye. Is the patient up to date with their annual eye exam?  Yes  Who is the provider or what is the name of the office in which the patient attends annual eye exams? Dr.Katsoudas If pt is not established with a provider, would they like to be referred to a provider to establish care? No .   Dental Screening: Recommended annual dental exams for proper oral hygiene  Community Resource Referral / Chronic Care Management: CRR required this visit?  No   CCM required this visit?  No      Plan:     I have personally reviewed and noted the following in the patient's chart:   Medical and social history Use of alcohol, tobacco or illicit drugs  Current medications and supplements including opioid prescriptions. Patient is not currently taking opioid prescriptions. Functional ability and status Nutritional status Physical activity Advanced directives List of other  physicians Hospitalizations, surgeries, and ER visits in previous 12 months Vitals Screenings to include cognitive, depression, and falls Referrals and appointments  In addition, I have reviewed and discussed with patient certain preventive protocols, quality metrics, and best practice recommendations. A written personalized care plan for preventive services as well as general preventive health recommendations were provided to patient.     Hal Hope, LPN   04/19/1094   Nurse Notes: none

## 2022-09-23 ENCOUNTER — Ambulatory Visit (INDEPENDENT_AMBULATORY_CARE_PROVIDER_SITE_OTHER): Payer: Medicare Other | Admitting: Nurse Practitioner

## 2022-09-23 ENCOUNTER — Encounter: Payer: Self-pay | Admitting: Nurse Practitioner

## 2022-09-23 VITALS — BP 121/75 | HR 88 | Temp 97.6°F | Ht 60.0 in | Wt 102.4 lb

## 2022-09-23 DIAGNOSIS — F411 Generalized anxiety disorder: Secondary | ICD-10-CM

## 2022-09-23 DIAGNOSIS — M810 Age-related osteoporosis without current pathological fracture: Secondary | ICD-10-CM

## 2022-09-23 DIAGNOSIS — I10 Essential (primary) hypertension: Secondary | ICD-10-CM | POA: Diagnosis not present

## 2022-09-23 DIAGNOSIS — E782 Mixed hyperlipidemia: Secondary | ICD-10-CM | POA: Diagnosis not present

## 2022-09-23 DIAGNOSIS — J410 Simple chronic bronchitis: Secondary | ICD-10-CM | POA: Diagnosis not present

## 2022-09-23 DIAGNOSIS — F17210 Nicotine dependence, cigarettes, uncomplicated: Secondary | ICD-10-CM

## 2022-09-23 DIAGNOSIS — E871 Hypo-osmolality and hyponatremia: Secondary | ICD-10-CM

## 2022-09-23 DIAGNOSIS — F329 Major depressive disorder, single episode, unspecified: Secondary | ICD-10-CM

## 2022-09-23 MED ORDER — UMECLIDINIUM-VILANTEROL 62.5-25 MCG/ACT IN AEPB: 1.0000 | INHALATION_SPRAY | Freq: Every day | RESPIRATORY_TRACT | 4 refills | Status: DC

## 2022-09-23 MED ORDER — ALENDRONATE SODIUM 70 MG PO TABS: 70.0000 mg | ORAL_TABLET | ORAL | 4 refills | Status: DC

## 2022-09-23 NOTE — Assessment & Plan Note (Signed)
Chronic, stable.  Denies SI/HI.  Continue current regimen and adjust as needed. 

## 2022-09-23 NOTE — Progress Notes (Signed)
BP 121/75   Pulse 88   Temp 97.6 F (36.4 C) (Oral)   Ht 5' (1.524 m)   Wt 102 lb 6.4 oz (46.4 kg)   SpO2 98%   BMI 20.00 kg/m    Subjective:    Patient ID: EUGENIE SAIZ, female    DOB: 07/15/1945, 77 y.o.   MRN: [id number]  HPI: RAYLINN TRICE is a 77 y.o. female  Chief Complaint  Patient presents with   COPD   Hypertension   Hyperlipidemia   Anxiety   Osteoporosis   HYPERTENSION & HYPERLIPIDEMIA Continues on Olmesartan and Crestor daily.   Satisfied with current treatment? yes Duration of hypertension: chronic BP monitoring frequency: not checking BP range:  BP medication side effects: no Duration of hyperlipidemia: chronic Cholesterol medication side effects: no Cholesterol supplements: none Medication compliance: good compliance Aspirin: yes Recent stressors: no Recurrent headaches: no Visual changes: no Palpitations: no Dyspnea: no Chest pain: no Lower extremity edema: no Dizzy/lightheaded: no The ASCVD Risk score (Arnett DK, et al., 2019) failed to calculate for the following reasons:   The valid HDL cholesterol range is 20 to 100 mg/dL   OSTEOPOROSIS DEXA 10/20/27 -- T score -3.5.  Missed doses of Fosamax while husband was in hospital. Satisfied with current treatment?: yes Medication side effects: no Medication compliance: good compliance Past osteoporosis medications/treatments: none Adequate calcium & vitamin D: yes Intolerance to bisphosphonates:no Weight bearing exercises: yes   COPD Using Anoro and Albuterol.   Last spirometry on 10/16/21 FEV1 62% and Anoro was started at the time. Continues to smoke, 1/2 PPD, she is attempting to cut back on own.  Has been smoking since she was age 77.   COPD status: controlled Satisfied with current treatment?: yes Oxygen use: no Dyspnea frequency: none Cough frequency: none Rescue inhaler frequency: no use since starting Anoro Limitation of activity: no Productive cough: none Last Spirometry:  10/16/21 Pneumovax: Up to Date Influenza: Up to Date   DEPRESSION/ANXIETY Continues on Effexor 75 XR MG daily + Buspar 10 MG BID -- she as stopped Buspar at this time as feels better.  Mood was exacerbated by husband's recent illness.   Mood status: stable Satisfied with current treatment?: yes Symptom severity: mild  Duration of current treatment : chronic Side effects: no Medication compliance: good compliance Psychotherapy/counseling: none Previous psychiatric medications: Effexor Depressed mood: occasional Anxious mood: occasional, Buspar helped Anhedonia: no Significant weight loss or gain: no Insomnia: none Fatigue: no Feelings of worthlessness or guilt: no Impaired concentration/indecisiveness: occasional Suicidal ideations: no Hopelessness: no Crying spells: no    09/23/2022   10:27 AM 08/18/2022   10:16 AM 05/14/2022   10:12 AM 05/06/2022   10:15 AM 03/24/2022   10:05 AM  Depression screen PHQ 2/9  Decreased Interest 0 0 0 0 0  Down, Depressed, Hopeless 1 0 0 0 0  PHQ - 2 Score 1 0 0 0 0  Altered sleeping 0 0 0 0 0  Tired, decreased energy 1 0 0 0 0  Change in appetite 0 0 0 0 0  Feeling bad or failure about yourself  0 0 0 0 0  Trouble concentrating 0 0 0 0 0  Moving slowly or fidgety/restless 0 0 0 0 0  Suicidal thoughts 0 0 0 0 0  PHQ-9 Score 2 0 0 0 0  Difficult doing work/chores Not difficult at all Not difficult at all Not difficult at all Not difficult at all Not difficult at all  09/23/2022   10:26 AM 05/14/2022   10:12 AM 05/06/2022   10:15 AM 03/24/2022   10:05 AM  GAD 7 : Generalized Anxiety Score  Nervous, Anxious, on Edge 1 3 1 1   Control/stop worrying 0 0 0 0  Worry too much - different things 0 0 0 0  Trouble relaxing 0 0 0 0  Restless 0 0 1 1  Easily annoyed or irritable 0 0 0 0  Afraid - awful might happen 0 0 0 0  Total GAD 7 Score 1 3 2 2   Anxiety Difficulty Not difficult at all Somewhat difficult  Not difficult at all   Relevant  past medical, surgical, family and social history reviewed and updated as indicated. Interim medical history since our last visit reviewed. Allergies and medications reviewed and updated.  Review of Systems  Constitutional:  Negative for activity change, appetite change, diaphoresis, fatigue and fever.  Respiratory:  Negative for cough, chest tightness and shortness of breath.   Cardiovascular:  Negative for chest pain, palpitations and leg swelling.  Gastrointestinal: Negative.   Neurological: Negative.   Psychiatric/Behavioral: Negative.      Per HPI unless specifically indicated above     Objective:    BP 121/75   Pulse 88   Temp 97.6 F (36.4 C) (Oral)   Ht 5' (1.524 m)   Wt 102 lb 6.4 oz (46.4 kg)   SpO2 98%   BMI 20.00 kg/m   Wt Readings from Last 3 Encounters:  09/23/22 102 lb 6.4 oz (46.4 kg)  08/18/22 103 lb (46.7 kg)  05/14/22 103 lb 4.8 oz (46.9 kg)    Physical Exam Vitals and nursing note reviewed.  Constitutional:      General: She is awake. She is not in acute distress.    Appearance: She is well-developed and well-groomed. She is not ill-appearing.  HENT:     Head: Normocephalic.     Right Ear: Hearing normal.     Left Ear: Hearing normal.  Eyes:     General: Lids are normal.        Right eye: No discharge.        Left eye: No discharge.     Conjunctiva/sclera: Conjunctivae normal.     Pupils: Pupils are equal, round, and reactive to light.  Neck:     Thyroid: No thyromegaly.     Vascular: No carotid bruit.  Cardiovascular:     Rate and Rhythm: Normal rate and regular rhythm.     Heart sounds: Normal heart sounds. No murmur heard.    No gallop.  Pulmonary:     Effort: Pulmonary effort is normal. No accessory muscle usage or respiratory distress.     Breath sounds: Normal breath sounds. No decreased breath sounds, wheezing or rhonchi.  Abdominal:     General: Bowel sounds are normal.     Palpations: Abdomen is soft.  Musculoskeletal:      Cervical back: Normal range of motion and neck supple.     Right lower leg: No edema.     Left lower leg: No edema.  Lymphadenopathy:     Cervical: No cervical adenopathy.  Skin:    General: Skin is warm and dry.  Neurological:     Mental Status: She is alert and oriented to person, place, and time.  Psychiatric:        Attention and Perception: Attention normal.        Mood and Affect: Mood normal.  Speech: Speech normal.        Behavior: Behavior normal. Behavior is cooperative.        Thought Content: Thought content normal.    Results for orders placed or performed in visit on 03/24/22  CBC with Differential/Platelet  Result Value Ref Range   WBC 8.7 3.4 - 10.8 x10E3/uL   RBC 4.38 3.77 - 5.28 x10E6/uL   Hemoglobin 14.7 11.1 - 15.9 g/dL   Hematocrit 16.1 09.6 - 46.6 %   MCV 98 (H) 79 - 97 fL   MCH 33.6 (H) 26.6 - 33.0 pg   MCHC 34.2 31.5 - 35.7 g/dL   RDW 04.5 (L) 40.9 - 81.1 %   Platelets 285 150 - 450 x10E3/uL   Neutrophils 60 Not Estab. %   Lymphs 26 Not Estab. %   Monocytes 10 Not Estab. %   Eos 2 Not Estab. %   Basos 2 Not Estab. %   Neutrophils Absolute 5.2 1.4 - 7.0 x10E3/uL   Lymphocytes Absolute 2.3 0.7 - 3.1 x10E3/uL   Monocytes Absolute 0.9 0.1 - 0.9 x10E3/uL   EOS (ABSOLUTE) 0.2 0.0 - 0.4 x10E3/uL   Basophils Absolute 0.2 0.0 - 0.2 x10E3/uL   Immature Granulocytes 0 Not Estab. %   Immature Grans (Abs) 0.0 0.0 - 0.1 x10E3/uL  Comprehensive metabolic panel  Result Value Ref Range   Glucose 73 70 - 99 mg/dL   BUN 12 8 - 27 mg/dL   Creatinine, Ser 9.14 0.57 - 1.00 mg/dL   eGFR 73 >78 GN/FAO/1.30   BUN/Creatinine Ratio 14 12 - 28   Sodium 140 134 - 144 mmol/L   Potassium 4.2 3.5 - 5.2 mmol/L   Chloride 100 96 - 106 mmol/L   CO2 25 20 - 29 mmol/L   Calcium 9.7 8.7 - 10.3 mg/dL   Total Protein 6.9 6.0 - 8.5 g/dL   Albumin 4.7 3.8 - 4.8 g/dL   Globulin, Total 2.2 1.5 - 4.5 g/dL   Albumin/Globulin Ratio 2.1 1.2 - 2.2   Bilirubin Total 0.6 0.0 - 1.2  mg/dL   Alkaline Phosphatase 67 44 - 121 IU/L   AST 33 0 - 40 IU/L   ALT 23 0 - 32 IU/L  Lipid Panel w/o Chol/HDL Ratio  Result Value Ref Range   Cholesterol, Total 158 100 - 199 mg/dL   Triglycerides 81 0 - 149 mg/dL   HDL 865 >78 mg/dL   VLDL Cholesterol Cal 15 5 - 40 mg/dL   LDL Chol Calc (NIH) 36 0 - 99 mg/dL  TSH  Result Value Ref Range   TSH 2.100 0.450 - 4.500 uIU/mL  Magnesium  Result Value Ref Range   Magnesium 1.9 1.6 - 2.3 mg/dL  VITAMIN D 25 Hydroxy (Vit-D Deficiency, Fractures)  Result Value Ref Range   Vit D, 25-Hydroxy 38.5 30.0 - 100.0 ng/mL      Assessment & Plan:   Problem List Items Addressed This Visit       Cardiovascular and Mediastinum   Hypertension    Chronic, stable  BP at goal for age in office today.  Continue Olmesartan at current dose and adjust as needed.  Would consider changing Effexor to an SSRI due to effect of Effexor on BP if elevations ever present.  She is to check BP daily at home and document.  Recommend complete cessation of smoking and focus on DASH diet.  LABS: CMP.  Refills up to date.  Return to office in 6 months, she is to alert  provider if any elevations >130/80 on BP at home and be seen sooner if present.        Respiratory   COPD (chronic obstructive pulmonary disease) (HCC) - Primary    Chronic, ongoing.  Spirometry on 10/16/21 stable FEV1 62% and FEV1/FVC 79%.  Improved with Anoro on board, continue this and Albuterol as needed.  Minimally uses Albuterol.  Adjust regimen as needed.  Recommend complete cessation of smoking.      Relevant Medications   umeclidinium-vilanterol (ANORO ELLIPTA) 62.5-25 MCG/ACT AEPB   Other Relevant Orders   Ambulatory Referral Lung Cancer Screening [facility]     Musculoskeletal and Integument   Osteoporosis    Ongoing, continue Fosamax -- recommend she restart.  May consider period off Fosamax in future, at this time continue due to ongoing osteoporosis on imaging.  Vit D level  annually.  DEXA next in April 2024 -- have ordered and recommend she schedule, provided her number to call.      Relevant Medications   alendronate (FOSAMAX) 70 MG tablet     Other   Depression    Chronic, stable.  Denies SI/HI.  Continue Effexor 75 MG daily as is beneficial at this time, could consider change to SSRI if elevation in BP ever presents.  Buspar as needed.  At this time return in 6 months, sooner if worsening mood.      Generalized anxiety disorder    Chronic, stable.  Denies SI/HI.  Continue current regimen and adjust as needed.      Hyponatremia    Chronic in nature.  Recheck NA + today, has been adding salt to diet daily.  Is on Effexor which may reduce NA+, may need to consider reduction of Effexor in future.      Mixed hyperlipidemia    Chronic, ongoing.  Tolerating Crestor, continue this daily and adjust as needed.  Lipid panel today.      Relevant Orders   Comprehensive metabolic panel   Lipid Panel w/o Chol/HDL Ratio   Nicotine dependence, cigarettes, uncomplicated    I have recommended complete cessation of tobacco use. I have discussed various options available for assistance with tobacco cessation including over the counter methods (Nicotine gum, patch and lozenges). We also discussed prescription options (Chantix, Nicotine Inhaler / Nasal Spray). The patient is not interested in pursuing any prescription tobacco cessation options at this time.  Referral to restart lung cancer screening, she will look into cost.       Relevant Orders   Ambulatory Referral Lung Cancer Screening [facility]     Follow up plan: Return in about 6 months (around 03/25/2023) for Annual physical due after 03/25/23.

## 2022-09-23 NOTE — Patient Instructions (Addendum)
Please call to schedule your mammogram and/or bone density: [facility] [place]: [address], [place], [place] [address] Phone: [phone]  [place] [facility] [address]. [place],  [place]  [address] Phone: [phone]    Osteoporosis  Osteoporosis is when the bones get thin and weak. This can cause your bones to break (fracture) more easily. What are the causes? The exact cause of this condition is not known. What increases the risk? Having family members with this condition. Not eating enough healthy foods. Taking certain medicines. Being female. Being age 60 or older. Smoking or using other products that contain nicotine or tobacco, such as e-cigarettes or chewing tobacco. Not exercising. Being of European or Asian ancestry. Having a small body frame. What are the signs or symptoms? A broken bone might be the first sign, especially if the break results from a fall or injury that usually would not cause a bone to break. Other signs and symptoms include: Pain in the neck or low back. Being hunched over (stooped posture). Getting shorter. How is this treated? Eating more foods with more calcium and vitamin D in them. Doing exercises. Stopping tobacco use. Limiting how much alcohol you drink. Taking medicines to slow bone loss or help make the bones stronger. Taking supplements of calcium and vitamin D every day. Taking medicines to replace chemicals in the body (hormone replacement medicines). Monitoring your levels of calcium and vitamin D. The goal of treatment is to strengthen your bones and lower your risk for a bone break. Follow these instructions at home: Eating and drinking Eat plenty of calcium and vitamin D. These nutrients are good for your bones. Good sources of calcium and vitamin D include: Some fish, such as salmon and tuna. Foods that have calcium and vitamin D added to them  (fortified foods), such as some breakfast cereals. Egg yolks. Cheese. Liver.  Activity Do exercises as told by your doctor. Ask your doctor what exercises are safe for you. You should do: Exercises that make your muscles work to hold your body weight up (weight-bearing exercises). These include tai chi, yoga, and walking. Exercises to make your muscles stronger. One example is lifting weights. Lifestyle Do not drink alcohol if: Your doctor tells you not to drink. You are pregnant, may be pregnant, or are planning to become pregnant. If you drink alcohol: Limit how much you use to: 0-1 drink a day for women. 0-2 drinks a day for men. Know how much alcohol is in your drink. In the [place] one drink equals one 12 oz bottle of beer (355 mL), one 5 oz glass of wine (148 mL), or one 1 oz glass of hard liquor (44 mL). Do not smoke or use any products that contain nicotine or tobacco. If you need help quitting, ask your doctor. Preventing falls Use tools to help you move around (mobility aids) as needed. These include canes, walkers, scooters, and crutches. Keep rooms well-lit. Put away things on the floor that could make you trip. These include cords and rugs. Install safety rails on stairs. Install grab bars in bathrooms. Use rubber mats in slippery areas, like bathrooms. Wear shoes that: Fit you well. Support your feet. Have closed toes. Have rubber soles or low heels. Tell your doctor about all of the medicines you are taking. Some medicines can make you more likely to fall. General instructions Take over-the-counter and prescription medicines only as told by your  doctor. Keep all follow-up visits. Contact a doctor if: You have not been tested (screened) for osteoporosis and you are: A woman who is age 60 or older. A man who is age 18 or older. Get help right away if: You fall. You get hurt. Summary Osteoporosis happens when your bones get thin and weak. Weak bones can break  (fracture) more easily. Eat plenty of calcium and vitamin D. These are good for your bones. Tell your doctor about all of the medicines that you take. This information is not intended to replace advice given to you by your health care provider. Make sure you discuss any questions you have with your health care provider. Document Revised: 09/15/2019 Document Reviewed: 09/15/2019 Elsevier Patient Education  2024 [employer].

## 2022-09-23 NOTE — Assessment & Plan Note (Signed)
Ongoing, continue Fosamax -- recommend she restart.  May consider period off Fosamax in future, at this time continue due to ongoing osteoporosis on imaging.  Vit D level annually.  DEXA next in April 2024 -- have ordered and recommend she schedule, provided her number to call.

## 2022-09-23 NOTE — Assessment & Plan Note (Signed)
Chronic, ongoing.  Tolerating Crestor, continue this daily and adjust as needed.  Lipid panel today. 

## 2022-09-23 NOTE — Assessment & Plan Note (Addendum)
I have recommended complete cessation of tobacco use. I have discussed various options available for assistance with tobacco cessation including over the counter methods (Nicotine gum, patch and lozenges). We also discussed prescription options (Chantix, Nicotine Inhaler / Nasal Spray). The patient is not interested in pursuing any prescription tobacco cessation options at this time.  Referral to restart lung cancer screening, she will look into cost.

## 2022-09-23 NOTE — Assessment & Plan Note (Signed)
Chronic, stable  BP at goal for age in office today.  Continue Olmesartan at current dose and adjust as needed.  Would consider changing Effexor to an SSRI due to effect of Effexor on BP if elevations ever present.  She is to check BP daily at home and document.  Recommend complete cessation of smoking and focus on DASH diet.  LABS: CMP.  Refills up to date.  Return to office in 6 months, she is to alert provider if any elevations >130/80 on BP at home and be seen sooner if present.

## 2022-09-23 NOTE — Assessment & Plan Note (Signed)
Chronic, ongoing.  Spirometry on 10/16/21 stable FEV1 62% and FEV1/FVC 79%.  Improved with Anoro on board, continue this and Albuterol as needed.  Minimally uses Albuterol.  Adjust regimen as needed.  Recommend complete cessation of smoking.

## 2022-09-23 NOTE — Assessment & Plan Note (Signed)
Chronic in nature.  Recheck NA + today, has been adding salt to diet daily.  Is on Effexor which may reduce NA+, may need to consider reduction of Effexor in future. 

## 2022-09-23 NOTE — Assessment & Plan Note (Signed)
Chronic, stable.  Denies SI/HI.  Continue Effexor 75 MG daily as is beneficial at this time, could consider change to SSRI if elevation in BP ever presents.  Buspar as needed.  At this time return in 6 months, sooner if worsening mood.

## 2022-09-24 LAB — LIPID PANEL W/O CHOL/HDL RATIO
Cholesterol, Total: 154 mg/dL (ref 100–199)
HDL: 99 mg/dL (ref >39)
LDL Chol Calc (NIH): 41 mg/dL (ref 0–99)
Triglycerides: 76 mg/dL (ref 0–149)
VLDL Cholesterol Cal: 14 mg/dL (ref 5–40)

## 2022-09-24 LAB — COMPREHENSIVE METABOLIC PANEL
ALT: 21 IU/L (ref 0–32)
AST: 26 IU/L (ref 0–40)
Albumin/Globulin Ratio: 1.8
Albumin: 4.4 g/dL (ref 3.8–4.8)
Alkaline Phosphatase: 58 IU/L (ref 44–121)
BUN/Creatinine Ratio: 18 (ref 12–28)
BUN: 15 mg/dL (ref 8–27)
Bilirubin Total: 0.5 mg/dL (ref 0.0–1.2)
CO2: 26 mmol/L (ref 20–29)
Calcium: 9.6 mg/dL (ref 8.7–10.3)
Chloride: 100 mmol/L (ref 96–106)
Creatinine, Ser: 0.82 mg/dL (ref 0.57–1.00)
Globulin, Total: 2.4 g/dL (ref 1.5–4.5)
Glucose: 57 mg/dL — ABNORMAL LOW (ref 70–99)
Potassium: 4.7 mmol/L (ref 3.5–5.2)
Sodium: 137 mmol/L (ref 134–144)
Total Protein: 6.8 g/dL (ref 6.0–8.5)
eGFR: 75 mL/min/{1.73_m2} (ref >59)

## 2022-09-24 NOTE — Progress Notes (Signed)
Good morning please let Deedee know her labs have returned and reman stable.  No medication changes needed.  Great job!!! Keep being amazing!!  Thank you for allowing me to participate in your care.  I appreciate you. Kindest regards, Kalvin Buss

## 2022-10-13 DIAGNOSIS — H43813 Vitreous degeneration, bilateral: Secondary | ICD-10-CM | POA: Diagnosis not present

## 2022-10-13 DIAGNOSIS — Z961 Presence of intraocular lens: Secondary | ICD-10-CM | POA: Diagnosis not present

## 2022-11-14 ENCOUNTER — Telehealth: Payer: Self-pay | Admitting: Nurse Practitioner

## 2022-11-14 NOTE — Telephone Encounter (Signed)
Copied from CRM [id number]. Topic: General - Other >> Nov 14, 2022 10:07 AM Clide Dales wrote: Patient called to speak with someone about her ambulance bill. Patient said she was told to call the office if she received a second bill.Please advise.

## 2022-12-23 ENCOUNTER — Other Ambulatory Visit: Payer: Medicare Other

## 2022-12-24 ENCOUNTER — Other Ambulatory Visit: Payer: Medicare Other

## 2023-01-13 ENCOUNTER — Ambulatory Visit (INDEPENDENT_AMBULATORY_CARE_PROVIDER_SITE_OTHER): Payer: Medicare Other | Admitting: Nurse Practitioner

## 2023-01-13 ENCOUNTER — Encounter: Payer: Self-pay | Admitting: Nurse Practitioner

## 2023-01-13 VITALS — BP 122/76 | HR 90 | Temp 97.7°F | Wt 103.0 lb

## 2023-01-13 DIAGNOSIS — M25511 Pain in right shoulder: Secondary | ICD-10-CM | POA: Insufficient documentation

## 2023-01-13 MED ORDER — PREDNISONE 10 MG PO TABS: ORAL_TABLET | ORAL | 0 refills | Status: DC

## 2023-01-13 MED ORDER — LIDOCAINE 5 % EX PTCH: 1.0000 | MEDICATED_PATCH | CUTANEOUS | 0 refills | Status: AC

## 2023-01-13 NOTE — Assessment & Plan Note (Signed)
Acute for 5-6 days with no improvement.  ?coming from neck based on exam.  Has known OA to cervical spine noted in 2009.  Start Prednisone taper, she tolerates this well, and sent in Lidocaine patches to apply to area daily/take off in 12 hours.  Recommend continued use of Tylenol and Voltaren gel at home.  Gentle stretching daily.  Return in 2 weeks and if ongoing consider imaging  and possible injection, has had benefit from these in past.

## 2023-01-13 NOTE — Progress Notes (Signed)
BP 122/76   Pulse 90   Temp 97.7 F (36.5 C) (Oral)   Wt 103 lb (46.7 kg)   SpO2 (!) 86%   BMI 20.12 kg/m    Subjective:    Patient ID: Lauren Nolan, female    DOB: 03-16-1946, 77 y.o.   MRN: [id number]  HPI: Lauren Nolan is a 77 y.o. female  Chief Complaint  Patient presents with   Neck Pain    Pt states pain started in neck maybe 5-6 days ago    NECK& SHOULDER PAIN  Started 5-6 days ago to right side of neck radiating into shoulder.  It became worse over time.  No recent injuries or heavy lifting. It catches on her and hurts. In 2009 had imaging of neck via CT that noted diffuse advanced OA.  She has underlying osteoporosis. Treatments attempted: Voltaren, Tylenol  Relief with NSAIDs?:  No NSAIDs Taken Location:Right Duration:days Severity: 10/10 Quality: sharp, aching, and burning Frequency: intermittent Radiation:  into right shoulder Aggravating factors: tensing up, closing car door Alleviating factors: Tylenol and Voltaren gel Weakness:  no Paresthesias / decreased sensation:  no  Fevers:  no   Relevant past medical, surgical, family and social history reviewed and updated as indicated. Interim medical history since our last visit reviewed. Allergies and medications reviewed and updated.  Review of Systems  Constitutional:  Negative for activity change, appetite change, diaphoresis, fatigue and fever.  Respiratory:  Negative for cough, chest tightness and shortness of breath.   Cardiovascular:  Negative for chest pain, palpitations and leg swelling.  Gastrointestinal: Negative.   Musculoskeletal:  Positive for arthralgias and neck pain.  Neurological: Negative.   Psychiatric/Behavioral: Negative.     Per HPI unless specifically indicated above     Objective:    BP 122/76   Pulse 90   Temp 97.7 F (36.5 C) (Oral)   Wt 103 lb (46.7 kg)   SpO2 (!) 86%   BMI 20.12 kg/m   Wt Readings from Last 3 Encounters:  01/13/23 103 lb (46.7 kg)  09/23/22  102 lb 6.4 oz (46.4 kg)  08/18/22 103 lb (46.7 kg)    Physical Exam Vitals and nursing note reviewed.  Constitutional:      General: She is awake. She is not in acute distress.    Appearance: Normal appearance. She is well-developed and well-groomed. She is not ill-appearing or toxic-appearing.  HENT:     Head: Normocephalic.     Right Ear: Hearing and external ear normal.     Left Ear: Hearing and external ear normal.  Eyes:     General: Lids are normal.        Right eye: No discharge.        Left eye: No discharge.     Conjunctiva/sclera: Conjunctivae normal.     Pupils: Pupils are equal, round, and reactive to light.  Neck:     Vascular: No carotid bruit.  Cardiovascular:     Rate and Rhythm: Normal rate and regular rhythm.     Heart sounds: Normal heart sounds. No murmur heard.    No gallop.  Pulmonary:     Effort: Pulmonary effort is normal. No accessory muscle usage or respiratory distress.     Breath sounds: Normal breath sounds.  Abdominal:     General: Bowel sounds are normal. There is no distension.     Palpations: Abdomen is soft.     Tenderness: There is no abdominal tenderness.  Musculoskeletal:  Right shoulder: Tenderness (to medial scalpula area) present. No swelling, effusion or crepitus. Normal range of motion. Normal strength. Normal pulse.     Left shoulder: Normal.     Cervical back: Normal range of motion and neck supple. No rigidity or crepitus. Muscular tenderness (to lower right neck into medial scalpula) present. No pain with movement or spinous process tenderness. Normal range of motion.     Right lower leg: No edema.     Left lower leg: No edema.     Comments: No rashes noted  Skin:    General: Skin is warm and dry.  Neurological:     Mental Status: She is alert and oriented to person, place, and time.     Deep Tendon Reflexes: Reflexes are normal and symmetric.     Reflex Scores:      Brachioradialis reflexes are 2+ on the right side and 2+  on the left side.      Patellar reflexes are 2+ on the right side and 2+ on the left side. Psychiatric:        Attention and Perception: Attention normal.        Mood and Affect: Mood normal.        Speech: Speech normal.        Behavior: Behavior normal. Behavior is cooperative.        Thought Content: Thought content normal.    Results for orders placed or performed in visit on 09/23/22  Comprehensive metabolic panel  Result Value Ref Range   Glucose 57 (L) 70 - 99 mg/dL   BUN 15 8 - 27 mg/dL   Creatinine, Ser 2.53 0.57 - 1.00 mg/dL   eGFR 75 >66 YQ/IHK/7.42   BUN/Creatinine Ratio 18 12 - 28   Sodium 137 134 - 144 mmol/L   Potassium 4.7 3.5 - 5.2 mmol/L   Chloride 100 96 - 106 mmol/L   CO2 26 20 - 29 mmol/L   Calcium 9.6 8.7 - 10.3 mg/dL   Total Protein 6.8 6.0 - 8.5 g/dL   Albumin 4.4 3.8 - 4.8 g/dL   Globulin, Total 2.4 1.5 - 4.5 g/dL   Albumin/Globulin Ratio 1.8    Bilirubin Total 0.5 0.0 - 1.2 mg/dL   Alkaline Phosphatase 58 44 - 121 IU/L   AST 26 0 - 40 IU/L   ALT 21 0 - 32 IU/L  Lipid Panel w/o Chol/HDL Ratio  Result Value Ref Range   Cholesterol, Total 154 100 - 199 mg/dL   Triglycerides 76 0 - 149 mg/dL   HDL 99 >59 mg/dL   VLDL Cholesterol Cal 14 5 - 40 mg/dL   LDL Chol Calc (NIH) 41 0 - 99 mg/dL      Assessment & Plan:   Problem List Items Addressed This Visit       Other   Right shoulder pain - Primary    Acute for 5-6 days with no improvement.  ?coming from neck based on exam.  Has known OA to cervical spine noted in 2009.  Start Prednisone taper, she tolerates this well, and sent in Lidocaine patches to apply to area daily/take off in 12 hours.  Recommend continued use of Tylenol and Voltaren gel at home.  Gentle stretching daily.  Return in 2 weeks and if ongoing consider imaging  and possible injection, has had benefit from these in past.        Follow up plan: Return in about 2 weeks (around 01/27/2023) for Shoulder pain.

## 2023-01-13 NOTE — Patient Instructions (Signed)
Shoulder Pain Many things can cause shoulder pain, including: An injury. Moving the shoulder in the same way again and again (overuse). Joint pain (arthritis). Pain can come from: Swelling and irritation (inflammation) of any part of the shoulder. An injury to: The shoulder joint. Tissues that connect muscle to bone (tendons). Tissues that connect bones to each other (ligaments). Bones. Follow these instructions at home: Watch for changes in your symptoms. Let your doctor know about them. Follow these instructions to help with your pain. If you have a sling that can be taken off: Wear the sling as told by your doctor. Take it off only as told by your doctor. Check the skin around the sling every day. Tell your doctor if you see problems. Loosen the sling if your fingers: Tingle. Become numb. Become cold. Keep the sling clean. If the sling is not waterproof: Do not let it get wet. Take the sling off when you shower or bathe. Managing pain, stiffness, and swelling  If told, put ice on the painful area. Put ice in a plastic bag. Place a towel between your skin and the bag. Leave the ice on for 20 minutes, 2-3 times a day. Stop putting ice on if it does not help with the pain. If your skin turns bright red, take off the ice right away to prevent skin damage. The risk of damage is higher if you cannot feel pain, heat, or cold. Squeeze a soft ball or a foam pad as much as possible. This prevents swelling in the shoulder. It also helps to strengthen the arm. General instructions Take over-the-counter and prescription medicines only as told by your doctor. Keep all follow-up visits. This will help you avoid any type of permanent shoulder problems. Contact a doctor if: Your pain gets worse. Medicine does not help your pain. You have new pain in your arm, hand, or fingers. You loosen your sling and your arm, hand, or fingers: Tingle. Are numb. Are swollen. Get help right away  if: Your arm, hand, or fingers turn white or blue. This information is not intended to replace advice given to you by your health care provider. Make sure you discuss any questions you have with your health care provider. Document Revised: 11/01/2021 Document Reviewed: 11/01/2021 Elsevier Patient Education  2024 [employer]  

## 2023-01-15 ENCOUNTER — Telehealth: Payer: Self-pay | Admitting: Nurse Practitioner

## 2023-01-15 NOTE — Telephone Encounter (Signed)
[employer] is calling in because the PA for lidocaine (LIDODERM) 5 % [[id number]] has been approved effective 01/15/23-01/15/24 , [id number] Option 5 is the number if there are any questions.

## 2023-02-01 NOTE — Patient Instructions (Incomplete)
Try over the counter cortizone cream for itchiness  Shoulder Pain Many things can cause shoulder pain, including: An injury. Moving the shoulder in the same way again and again (overuse). Joint pain (arthritis). Pain can come from: Swelling and irritation (inflammation) of any part of the shoulder. An injury to: The shoulder joint. Tissues that connect muscle to bone (tendons). Tissues that connect bones to each other (ligaments). Bones. Follow these instructions at home: Watch for changes in your symptoms. Let your doctor know about them. Follow these instructions to help with your pain. If you have a sling that can be taken off: Wear the sling as told by your doctor. Take it off only as told by your doctor. Check the skin around the sling every day. Tell your doctor if you see problems. Loosen the sling if your fingers: Tingle. Become numb. Become cold. Keep the sling clean. If the sling is not waterproof: Do not let it get wet. Take the sling off when you shower or bathe. Managing pain, stiffness, and swelling  If told, put ice on the painful area. Put ice in a plastic bag. Place a towel between your skin and the bag. Leave the ice on for 20 minutes, 2-3 times a day. Stop putting ice on if it does not help with the pain. If your skin turns bright red, take off the ice right away to prevent skin damage. The risk of damage is higher if you cannot feel pain, heat, or cold. Squeeze a soft ball or a foam pad as much as possible. This prevents swelling in the shoulder. It also helps to strengthen the arm. General instructions Take over-the-counter and prescription medicines only as told by your doctor. Keep all follow-up visits. This will help you avoid any type of permanent shoulder problems. Contact a doctor if: Your pain gets worse. Medicine does not help your pain. You have new pain in your arm, hand, or fingers. You loosen your sling and your arm, hand, or  fingers: Tingle. Are numb. Are swollen. Get help right away if: Your arm, hand, or fingers turn white or blue. This information is not intended to replace advice given to you by your health care provider. Make sure you discuss any questions you have with your health care provider. Document Revised: 11/01/2021 Document Reviewed: 11/01/2021 Elsevier Patient Education  2024 [employer].

## 2023-02-05 ENCOUNTER — Ambulatory Visit (INDEPENDENT_AMBULATORY_CARE_PROVIDER_SITE_OTHER): Payer: Medicare Other | Admitting: Family Medicine

## 2023-02-05 VITALS — BP 133/84 | HR 98 | Temp 97.9°F | Ht 62.6 in | Wt 101.4 lb

## 2023-02-05 DIAGNOSIS — L853 Xerosis cutis: Secondary | ICD-10-CM | POA: Diagnosis not present

## 2023-02-05 DIAGNOSIS — M25511 Pain in right shoulder: Secondary | ICD-10-CM | POA: Diagnosis not present

## 2023-02-05 NOTE — Assessment & Plan Note (Signed)
Acute, improved. Recommend continue use of Tylenol, Voltaren gel, and lidocaine patches as needed if pain returns. Return as needed.

## 2023-02-05 NOTE — Progress Notes (Signed)
BP 133/84   Pulse 98   Temp 97.9 F (36.6 C) (Oral)   Ht 5' 2.6" (1.59 m)   Wt 101 lb 6.4 oz (46 kg)   SpO2 99%   BMI 18.19 kg/m    Subjective:    Patient ID: Lauren Nolan, female    DOB: 14-Jan-1946, 77 y.o.   MRN: [id number]  HPI: Lauren Nolan is a 77 y.o. female  Chief Complaint  Patient presents with   Follow-up    Shoulder pain    Noticed small itchy spot on her left posterior calf yesterday with some redness after being outside for a few minutes. Denies pain, drainage, fatigue, headache, dizziness, nausea, vomitting.  NECK& SHOULDER PAIN  Completed Prednisone taper, with use of Lidocaine patches, Tylenol and Voltaren gel daily. She noticed significant improvement after day 1. Today she is in no pain and admits the pain has resolved.  Treatments attempted: Voltaren, Tylenol, lidocaine patch  Relief with NSAIDs?:  No NSAIDs Taken Location:Right Duration:days Severity: 0/10 Quality: Improvement Frequency:None Radiation:  into right shoulder Weakness:  no Paresthesias / decreased sensation:  no  Fevers:  no   Relevant past medical, surgical, family and social history reviewed and updated as indicated. Interim medical history since our last visit reviewed. Allergies and medications reviewed and updated.  Review of Systems  Constitutional:  Negative for diaphoresis, fatigue and fever.  Respiratory:  Negative for cough, chest tightness and shortness of breath.   Cardiovascular:  Negative for chest pain, palpitations and leg swelling.  Gastrointestinal: Negative.   Musculoskeletal:  Negative for arthralgias and neck pain.  Skin:  Negative for color change and rash.       Dry skin   Neurological: Negative.   Psychiatric/Behavioral: Negative.     Per HPI unless specifically indicated above     Objective:    BP 133/84   Pulse 98   Temp 97.9 F (36.6 C) (Oral)   Ht 5' 2.6" (1.59 m)   Wt 101 lb 6.4 oz (46 kg)   SpO2 99%   BMI 18.19 kg/m   Wt Readings  from Last 3 Encounters:  02/05/23 101 lb 6.4 oz (46 kg)  01/13/23 103 lb (46.7 kg)  09/23/22 102 lb 6.4 oz (46.4 kg)    Physical Exam Vitals and nursing note reviewed.  Constitutional:      General: She is awake. She is not in acute distress.    Appearance: Normal appearance. She is well-developed, well-groomed and underweight. She is not ill-appearing.  HENT:     Head: Normocephalic and atraumatic.     Right Ear: Hearing and external ear normal. No drainage.     Left Ear: Hearing and external ear normal. No drainage.     Nose: Nose normal.  Eyes:     General: Lids are normal.        Right eye: No discharge.        Left eye: No discharge.     Conjunctiva/sclera: Conjunctivae normal.  Cardiovascular:     Rate and Rhythm: Normal rate and regular rhythm.     Pulses:          Radial pulses are 2+ on the right side and 2+ on the left side.       Posterior tibial pulses are 2+ on the right side and 2+ on the left side.     Heart sounds: Normal heart sounds, S1 normal and S2 normal. No murmur heard.    No gallop.  Pulmonary:     Effort: Pulmonary effort is normal. No accessory muscle usage or respiratory distress.     Breath sounds: Normal breath sounds. No wheezing, rhonchi or rales.  Musculoskeletal:        General: Normal range of motion.     Right shoulder: No swelling, tenderness or bony tenderness. Normal range of motion. Normal strength. Normal pulse.     Left shoulder: Normal.     Right upper arm: No swelling or tenderness.     Left upper arm: Normal.     Cervical back: Full passive range of motion without pain and normal range of motion.     Right lower leg: No edema.     Left lower leg: No edema.  Skin:    General: Skin is warm and dry.     Capillary Refill: Capillary refill takes less than 2 seconds.       Neurological:     Mental Status: She is alert and oriented to person, place, and time.  Psychiatric:        Attention and Perception: Attention normal.         Mood and Affect: Mood normal.        Speech: Speech normal.        Behavior: Behavior normal. Behavior is cooperative.        Thought Content: Thought content normal.    Results for orders placed or performed in visit on 09/23/22  Comprehensive metabolic panel  Result Value Ref Range   Glucose 57 (L) 70 - 99 mg/dL   BUN 15 8 - 27 mg/dL   Creatinine, Ser 1.47 0.57 - 1.00 mg/dL   eGFR 75 >82 NF/AOZ/3.08   BUN/Creatinine Ratio 18 12 - 28   Sodium 137 134 - 144 mmol/L   Potassium 4.7 3.5 - 5.2 mmol/L   Chloride 100 96 - 106 mmol/L   CO2 26 20 - 29 mmol/L   Calcium 9.6 8.7 - 10.3 mg/dL   Total Protein 6.8 6.0 - 8.5 g/dL   Albumin 4.4 3.8 - 4.8 g/dL   Globulin, Total 2.4 1.5 - 4.5 g/dL   Albumin/Globulin Ratio 1.8    Bilirubin Total 0.5 0.0 - 1.2 mg/dL   Alkaline Phosphatase 58 44 - 121 IU/L   AST 26 0 - 40 IU/L   ALT 21 0 - 32 IU/L  Lipid Panel w/o Chol/HDL Ratio  Result Value Ref Range   Cholesterol, Total 154 100 - 199 mg/dL   Triglycerides 76 0 - 149 mg/dL   HDL 99 >65 mg/dL   VLDL Cholesterol Cal 14 5 - 40 mg/dL   LDL Chol Calc (NIH) 41 0 - 99 mg/dL      Assessment & Plan:   Problem List Items Addressed This Visit     Right shoulder pain - Primary    Acute, improved. Recommend continue use of Tylenol, Voltaren gel, and lidocaine patches as needed if pain returns. Return as needed.       Other Visit Diagnoses     Dry skin dermatitis       Acute, stable. Area on left posterior calf. Recommend otc cortizone cream for itchiness.         Follow up plan: Return if symptoms worsen or fail to improve.

## 2023-02-18 ENCOUNTER — Ambulatory Visit: Payer: Medicare Other | Admitting: Dermatology

## 2023-03-25 ENCOUNTER — Encounter: Payer: Medicare Other | Admitting: Nurse Practitioner

## 2023-04-25 NOTE — Patient Instructions (Addendum)
Please call to schedule your mammogram and/or bone density: [facility] at [facility]  Address: [address], [place], [place] [address] Phone: [phone]  [facility] [address] [place],  [place]  [address] Phone: [phone]   Heart-Healthy Eating Plan Many factors influence your heart health, including eating and exercise habits. Heart health is also called coronary health. Coronary risk increases with abnormal blood fat (lipid) levels. A heart-healthy eating plan includes limiting unhealthy fats, increasing healthy fats, limiting salt (sodium) intake, and making other diet and lifestyle changes. What is my plan? Your health care provider may recommend that: You limit your fat intake to _________% or less of your total calories each day. You limit your saturated fat intake to _________% or less of your total calories each day. You limit the amount of cholesterol in your diet to less than _________ mg per day. You limit the amount of sodium in your diet to less than _________ mg per day. What are tips for following this plan? Cooking Cook foods using methods other than frying. Baking, boiling, grilling, and broiling are all good options. Other ways to reduce fat include: Removing the skin from poultry. Removing all visible fats from meats. Steaming vegetables in water or broth. Meal planning  At meals, imagine dividing your plate into fourths: Fill one-half of your plate with vegetables and green salads. Fill one-fourth of your plate with whole grains. Fill one-fourth of your plate with lean protein foods. Eat 2-4 cups of vegetables per day. One cup of vegetables equals 1 cup (91 g) broccoli or cauliflower florets, 2 medium carrots, 1 large bell pepper, 1 large sweet potato, 1 large tomato, 1 medium white potato, 2 cups (150 g) raw leafy greens. Eat 1-2 cups of fruit per day. One cup of fruit equals 1 small  apple, 1 large banana, 1 cup (237 g) mixed fruit, 1 large orange,  cup (82 g) dried fruit, 1 cup (240 mL) 100% fruit juice. Eat more foods that contain soluble fiber. Examples include apples, broccoli, carrots, beans, peas, and barley. Aim to get 25-30 g of fiber per day. Increase your consumption of legumes, nuts, and seeds to 4-5 servings per week. One serving of dried beans or legumes equals  cup (90 g) cooked, 1 serving of nuts is  oz (12 almonds, 24 pistachios, or 7 walnut halves), and 1 serving of seeds equals  oz (8 g). Fats Choose healthy fats more often. Choose monounsaturated and polyunsaturated fats, such as olive and canola oils, avocado oil, flaxseeds, walnuts, almonds, and seeds. Eat more omega-3 fats. Choose salmon, mackerel, sardines, tuna, flaxseed oil, and ground flaxseeds. Aim to eat fish at least 2 times each week. Check food labels carefully to identify foods with trans fats or high amounts of saturated fat. Limit saturated fats. These are found in animal products, such as meats, butter, and cream. Plant sources of saturated fats include palm oil, palm kernel oil, and coconut oil. Avoid foods with partially hydrogenated oils in them. These contain trans fats. Examples are stick margarine, some tub margarines, cookies, crackers, and other baked goods. Avoid fried foods. General information Eat more home-cooked food and less restaurant, buffet, and fast food. Limit or avoid alcohol. Limit foods that are high in added sugar and simple starches such as foods made using white refined flour (white breads, pastries, sweets). Lose weight if you are overweight. Losing just 5-10% of your body weight can help your  overall health and prevent diseases such as diabetes and heart disease. Monitor your sodium intake, especially if you have high blood pressure. Talk with your health care provider about your sodium intake. Try to incorporate more vegetarian meals weekly. What foods should I  eat? Fruits All fresh, canned (in natural juice), or frozen fruits. Vegetables Fresh or frozen vegetables (raw, steamed, roasted, or grilled). Green salads. Grains Most grains. Choose whole wheat and whole grains most of the time. Rice and pasta, including brown rice and pastas made with whole wheat. Meats and other proteins Lean, well-trimmed beef, veal, pork, and lamb. Chicken and [place] without skin. All fish and shellfish. Wild duck, rabbit, pheasant, and venison. Egg whites or low-cholesterol egg substitutes. Dried beans, peas, lentils, and tofu. Seeds and most nuts. Dairy Low-fat or nonfat cheeses, including ricotta and mozzarella. Skim or 1% milk (liquid, powdered, or evaporated). Buttermilk made with low-fat milk. Nonfat or low-fat yogurt. Fats and oils Non-hydrogenated (trans-free) margarines. Vegetable oils, including soybean, sesame, sunflower, olive, avocado, peanut, safflower, corn, canola, and cottonseed. Salad dressings or mayonnaise made with a vegetable oil. Beverages Water (mineral or sparkling). Coffee and tea. Unsweetened ice tea. Diet beverages. Sweets and desserts Sherbet, gelatin, and fruit ice. Small amounts of dark chocolate. Limit all sweets and desserts. Seasonings and condiments All seasonings and condiments. The items listed above may not be a complete list of foods and beverages you can eat. Contact a dietitian for more options. What foods should I avoid? Fruits Canned fruit in heavy syrup. Fruit in cream or butter sauce. Fried fruit. Limit coconut. Vegetables Vegetables cooked in cheese, cream, or butter sauce. Fried vegetables. Grains Breads made with saturated or trans fats, oils, or whole milk. Croissants. Sweet rolls. Donuts. High-fat crackers, such as cheese crackers and chips. Meats and other proteins Fatty meats, such as hot dogs, ribs, sausage, bacon, rib-eye roast or steak. High-fat deli meats, such as salami and bologna. Caviar. Domestic duck and  goose. Organ meats, such as liver. Dairy Cream, sour cream, cream cheese, and creamed cottage cheese. Whole-milk cheeses. Whole or 2% milk (liquid, evaporated, or condensed). Whole buttermilk. Cream sauce or high-fat cheese sauce. Whole-milk yogurt. Fats and oils Meat fat, or shortening. Cocoa butter, hydrogenated oils, palm oil, coconut oil, palm kernel oil. Solid fats and shortenings, including bacon fat, salt pork, lard, and butter. Nondairy cream substitutes. Salad dressings with cheese or sour cream. Beverages Regular sodas and any drinks with added sugar. Sweets and desserts Frosting. Pudding. Cookies. Cakes. Pies. Milk chocolate or white chocolate. Buttered syrups. Full-fat ice cream or ice cream drinks. The items listed above may not be a complete list of foods and beverages to avoid. Contact a dietitian for more information. Summary Heart-healthy meal planning includes limiting unhealthy fats, increasing healthy fats, limiting salt (sodium) intake and making other diet and lifestyle changes. Lose weight if you are overweight. Losing just 5-10% of your body weight can help your overall health and prevent diseases such as diabetes and heart disease. Focus on eating a balance of foods, including fruits and vegetables, low-fat or nonfat dairy, lean protein, nuts and legumes, whole grains, and heart-healthy oils and fats. This information is not intended to replace advice given to you by your health care provider. Make sure you discuss any questions you have with your health care provider. Document Revised: 05/06/2021 Document Reviewed: 05/06/2021 Elsevier Patient Education  2024 [employer].

## 2023-04-28 ENCOUNTER — Encounter: Payer: Self-pay | Admitting: Nurse Practitioner

## 2023-04-28 ENCOUNTER — Ambulatory Visit (INDEPENDENT_AMBULATORY_CARE_PROVIDER_SITE_OTHER): Payer: Medicare Other | Admitting: Nurse Practitioner

## 2023-04-28 VITALS — BP 125/71 | HR 97 | Temp 98.2°F | Ht 62.5 in | Wt 100.2 lb

## 2023-04-28 DIAGNOSIS — Z1211 Encounter for screening for malignant neoplasm of colon: Secondary | ICD-10-CM

## 2023-04-28 DIAGNOSIS — Z0000 Encounter for general adult medical examination without abnormal findings: Secondary | ICD-10-CM | POA: Diagnosis not present

## 2023-04-28 DIAGNOSIS — E871 Hypo-osmolality and hyponatremia: Secondary | ICD-10-CM

## 2023-04-28 DIAGNOSIS — Z1231 Encounter for screening mammogram for malignant neoplasm of breast: Secondary | ICD-10-CM

## 2023-04-28 DIAGNOSIS — I10 Essential (primary) hypertension: Secondary | ICD-10-CM

## 2023-04-28 DIAGNOSIS — F411 Generalized anxiety disorder: Secondary | ICD-10-CM

## 2023-04-28 DIAGNOSIS — E782 Mixed hyperlipidemia: Secondary | ICD-10-CM

## 2023-04-28 DIAGNOSIS — J410 Simple chronic bronchitis: Secondary | ICD-10-CM

## 2023-04-28 DIAGNOSIS — F17210 Nicotine dependence, cigarettes, uncomplicated: Secondary | ICD-10-CM

## 2023-04-28 DIAGNOSIS — M810 Age-related osteoporosis without current pathological fracture: Secondary | ICD-10-CM | POA: Diagnosis not present

## 2023-04-28 DIAGNOSIS — F329 Major depressive disorder, single episode, unspecified: Secondary | ICD-10-CM | POA: Diagnosis not present

## 2023-04-28 DIAGNOSIS — K219 Gastro-esophageal reflux disease without esophagitis: Secondary | ICD-10-CM

## 2023-04-28 LAB — MICROALBUMIN, URINE WAIVED
Creatinine, Urine Waived: 50 mg/dL (ref 10–300)
Microalb, Ur Waived: 10 mg/L (ref 0–19)

## 2023-04-28 MED ORDER — UMECLIDINIUM-VILANTEROL 62.5-25 MCG/ACT IN AEPB: 1.0000 | INHALATION_SPRAY | Freq: Every day | RESPIRATORY_TRACT | 4 refills | Status: AC

## 2023-04-28 MED ORDER — ALENDRONATE SODIUM 70 MG PO TABS: 70.0000 mg | ORAL_TABLET | ORAL | 4 refills | Status: AC

## 2023-04-28 MED ORDER — ALBUTEROL SULFATE HFA 108 (90 BASE) MCG/ACT IN AERS: INHALATION_SPRAY | RESPIRATORY_TRACT | 3 refills | Status: AC

## 2023-04-28 MED ORDER — VENLAFAXINE HCL ER 75 MG PO CP24: 75.0000 mg | ORAL_CAPSULE | Freq: Every day | ORAL | 4 refills | Status: DC

## 2023-04-28 MED ORDER — OMEPRAZOLE 20 MG PO CPDR: 20.0000 mg | DELAYED_RELEASE_CAPSULE | Freq: Every day | ORAL | 4 refills | Status: AC

## 2023-04-28 MED ORDER — OLMESARTAN MEDOXOMIL 20 MG PO TABS: 20.0000 mg | ORAL_TABLET | Freq: Every day | ORAL | 4 refills | Status: AC

## 2023-04-28 MED ORDER — ROSUVASTATIN CALCIUM 5 MG PO TABS: 5.0000 mg | ORAL_TABLET | Freq: Every day | ORAL | 4 refills | Status: AC

## 2023-04-28 NOTE — Assessment & Plan Note (Signed)
 Chronic, ongoing.  Spirometry on 10/16/21 stable FEV1 62% and FEV1/FVC 79%.  Improved with Anoro on board, continue this and Albuterol  as needed.  Minimally uses Albuterol .  Adjust regimen as needed.  Recommend complete cessation of smoking.  Will repeat spirometry next visit

## 2023-04-28 NOTE — Assessment & Plan Note (Signed)
Chronic, ongoing.  Tolerating Crestor, continue this daily and adjust as needed.  Lipid panel today. 

## 2023-04-28 NOTE — Assessment & Plan Note (Signed)
 Ongoing, continue Fosamax.  May consider period off Fosamax in future, at this time continue due to ongoing osteoporosis on imaging.  Vit D level annually.  Due for DEXA -- have ordered and recommend she schedule, provided her number to call.

## 2023-04-28 NOTE — Assessment & Plan Note (Addendum)
 I have recommended complete cessation of tobacco use. I have discussed various options available for assistance with tobacco cessation including over the counter methods (Nicotine gum, patch and lozenges). We also discussed prescription options (Chantix, Nicotine Inhaler / Nasal Spray). The patient is not interested in pursuing any prescription tobacco cessation options at this time.  Referral to restart lung cancer screening placed last visit, she has not attended.

## 2023-04-28 NOTE — Assessment & Plan Note (Signed)
Chronic, stable.  Continue Prilosec daily and adjust as needed.  Mag level annually. Risks of PPI use were discussed with patient including bone loss, C. Diff diarrhea, pneumonia, infections, CKD, electrolyte abnormalities. Verbalizes understanding and chooses to continue the medication.

## 2023-04-28 NOTE — Assessment & Plan Note (Signed)
Chronic, stable.  Denies SI/HI.  Continue Effexor 75 MG daily as is beneficial at this time, could consider change to SSRI if elevation in BP ever presents.  Buspar as needed.  At this time return in 6 months, sooner if worsening mood.

## 2023-04-28 NOTE — Assessment & Plan Note (Signed)
Chronic in nature.  Recheck NA + today, has been adding salt to diet daily.  Is on Effexor which may reduce NA+, may need to consider reduction of Effexor in future. 

## 2023-04-28 NOTE — Assessment & Plan Note (Signed)
Chronic, stable.  Denies SI/HI.  Continue current regimen and adjust as needed. 

## 2023-04-28 NOTE — Assessment & Plan Note (Signed)
 Chronic, stable  BP at goal for age in office today.  Continue Olmesartan  at current dose and adjust as needed.  Would consider changing Effexor  to an SSRI due to effect of Effexor  on BP if elevations ever present.  She is to check BP daily at home and document.  Recommend complete cessation of smoking and focus on DASH diet.  LABS: CBC, TSH, CMP.  Refills sent.  Return to office in 6 months, she is to alert provider if any elevations >130/80 on BP at home and be seen sooner if present.

## 2023-04-28 NOTE — Progress Notes (Signed)
 BP 125/71   Pulse 97   Temp 98.2 F (36.8 C) (Oral)   Ht 5' 2.5 (1.588 m)   Wt 100 lb 3.2 oz (45.5 kg)   SpO2 93%   BMI 18.03 kg/m    Subjective:    Patient ID: Lauren Nolan, female    DOB: 10/15/1945, 78 y.o.   MRN: [id number]  HPI: Lauren Nolan is a 78 y.o. female presenting on 04/28/2023 for comprehensive medical examination. Current medical complaints include:none  She currently lives with: husband Menopausal Symptoms: no  HYPERTENSION Continues on Olmesartan  and Crestor  daily.    Prilosec is taken daily, which helps relieve gas in her stomach per her report. Hypertension status: stable  Satisfied with current treatment? yes Duration of hypertension: chronic BP monitoring frequency: not checking BP range:  BP medication side effects:  no Medication compliance: good compliance Aspirin: yes Recurrent headaches: no Visual changes: no Palpitations: no Dyspnea: no Chest pain: no Lower extremity edema: no Dizzy/lightheaded: no The 10-year ASCVD risk score (Arnett DK, et al., 2019) is: 31%   Values used to calculate the score:     Age: 40 years     Sex: Female     Is Non-Hispanic African American: No     Diabetic: No     Tobacco smoker: Yes     Systolic Blood Pressure: 125 mmHg     Is BP treated: Yes     HDL Cholesterol: 99 mg/dL     Total Cholesterol: 154 mg/dL  OSTEOPOROSIS Last DEXA 07/17/20 -- T score -3.5 and is taking Fosamax  without issue. Satisfied with current treatment?: yes Medication side effects: no Medication compliance: good compliance Past osteoporosis medications/treatments: none Adequate calcium  & vitamin D : yes Intolerance to bisphosphonates:no Weight bearing exercises: yes   COPD Continues to use Anoro and Albuterol . Continues to smoke, 1/2 PPD, not interested in quitting.  Has been smoking since she was age 77.   COPD status: controlled Satisfied with current treatment?: yes Oxygen use: no Dyspnea frequency: no Cough frequency:  no Rescue inhaler frequency:  minimal  Limitation of activity: no Productive cough: none Last Spirometry: 10/16/21 Pneumovax: Up to Date Influenza: Up to Date   DEPRESSION/ANXIETY Taking Effexor  75 MG and takes Buspar  only as needed. Mood status: stable Satisfied with current treatment?: yes Symptom severity: mild  Duration of current treatment : chronic Side effects: no Medication compliance: good compliance Psychotherapy/counseling: none Previous psychiatric medications: Effexor  Depressed mood: no Anxious mood: on occasion -- if can not understand what husband wants Anhedonia: no Significant weight loss or gain: no Insomnia: none Fatigue: no Feelings of worthlessness or guilt: no Impaired concentration/indecisiveness: no Suicidal ideations: no Hopelessness: no Crying spells: no    04/28/2023    2:58 PM 02/05/2023   10:15 AM 01/13/2023    2:57 PM 09/23/2022   10:27 AM 08/18/2022   10:16 AM  Depression screen PHQ 2/9  Decreased Interest 0 0 0 0 0  Down, Depressed, Hopeless 0 0 1 1 0  PHQ - 2 Score 0 0 1 1 0  Altered sleeping 0 0 0 0 0  Tired, decreased energy 0 0 0 1 0  Change in appetite 0 0 0 0 0  Feeling bad or failure about yourself  0 0 0 0 0  Trouble concentrating 0 0 0 0 0  Moving slowly or fidgety/restless 0 0 0 0 0  Suicidal thoughts 0 0 0 0 0  PHQ-9 Score 0 0 1 2 0  Difficult  doing work/chores Not difficult at all Not difficult at all Very difficult Not difficult at all Not difficult at all      04/28/2023    2:59 PM 02/05/2023   10:15 AM 01/13/2023    2:57 PM 09/23/2022   10:26 AM  GAD 7 : Generalized Anxiety Score  Nervous, Anxious, on Edge 1 2 1 1   Control/stop worrying 0 1 0 0  Worry too much - different things 0 0 0 0  Trouble relaxing 0 0 0 0  Restless 0 0 0 0  Easily annoyed or irritable 0 0 0 0  Afraid - awful might happen 0 0 0 0  Total GAD 7 Score 1 3 1 1   Anxiety Difficulty Not difficult at all  Not difficult at all Not difficult at all       05/06/2022   10:15 AM 05/14/2022   10:12 AM 08/18/2022   10:19 AM 01/13/2023    2:57 PM 04/28/2023    2:58 PM  Fall Risk  Falls in the past year? 0 0 0 0 0  Was there an injury with Fall? 0 0 0 0 0  Fall Risk Category Calculator 0 0 0 0 0  Patient at Risk for Falls Due to No Fall Risks No Fall Risks No Fall Risks No Fall Risks No Fall Risks  Fall risk Follow up Falls evaluation completed Falls evaluation completed Falls prevention discussed;Falls evaluation completed Falls evaluation completed Falls evaluation completed    Functional Status Survey: Is the patient deaf or have difficulty hearing?: No Does the patient have difficulty seeing, even when wearing glasses/contacts?: No Does the patient have difficulty concentrating, remembering, or making decisions?: No Does the patient have difficulty walking or climbing stairs?: No Does the patient have difficulty dressing or bathing?: No Does the patient have difficulty doing errands alone such as visiting a doctor's office or shopping?: No   Past Medical History:  Past Medical History:  Diagnosis Date   Allergy    Anxiety    COPD (chronic obstructive pulmonary disease) (HCC)    Depression    GERD (gastroesophageal reflux disease)    Hypertension    Internal hemorrhoid    Lichen sclerosus    Osteoporosis    Spinal stenosis    Tobacco use     Surgical History:  Past Surgical History:  Procedure Laterality Date   BREAST EXCISIONAL BIOPSY Bilateral    1980's - benign   BREAST SURGERY Bilateral    biopsy   COLONOSCOPY WITH PROPOFOL  N/A 10/03/2019   Procedure: COLONOSCOPY WITH PROPOFOL ;  Surgeon: Therisa Bi, MD;  Location: [facility] ENDOSCOPY;  Service: Gastroenterology;  Laterality: N/A;   EYE SURGERY Bilateral Feb-Mar 2016   HERNIA REPAIR     TUBAL LIGATION      Medications:  Current Outpatient Medications on File Prior to Visit  Medication Sig   aspirin 81 MG tablet Take 81 mg by mouth daily.   busPIRone  (BUSPAR ) 5 MG  tablet Take 2 tablets (10 mg total) by mouth 2 (two) times daily.   lidocaine  (LIDODERM ) 5 % Place 1 patch onto the skin daily. Remove & Discard patch within 12 hours or as directed by MD   Turmeric (QC TUMERIC COMPLEX PO) Take by mouth.   VITAMIN D , CHOLECALCIFEROL, PO Take by mouth.   No current facility-administered medications on file prior to visit.    Allergies:  Allergies  Allergen Reactions   Elemental Sulfur    Midol [Acetaminophen ] Hypertension   Sulfa Antibiotics  Social History:  Social History   Socioeconomic History   Marital status: Married    Spouse name: Not on file   Number of children: Not on file   Years of education: Not on file   Highest education level: 8th grade  Occupational History   Occupation: retired  Tobacco Use   Smoking status: Every Day    Current packs/day: 0.25    Types: Cigarettes   Smokeless tobacco: Never   Tobacco comments:    1 pack every 3 days   Vaping Use   Vaping status: Never Used  Substance and Sexual Activity   Alcohol use: Yes    Alcohol/week: 3.0 standard drinks of alcohol    Types: 3 Glasses of wine per week   Drug use: No   Sexual activity: Yes  Other Topics Concern   Not on file  Social History Narrative   Retired, has daughter living at home with her and her husband    Social Drivers of Corporate Investment Banker Strain: Low Risk  (08/18/2022)   Overall Financial Resource Strain (CARDIA)    Difficulty of Paying Living Expenses: Not hard at all  Food Insecurity: No Food Insecurity (08/18/2022)   Hunger Vital Sign    Worried About Running Out of Food in the Last Year: Never true    Ran Out of Food in the Last Year: Never true  Transportation Needs: No Transportation Needs (08/18/2022)   PRAPARE - Administrator, Civil Service (Medical): No    Lack of Transportation (Non-Medical): No  Physical Activity: Insufficiently Active (08/18/2022)   Exercise Vital Sign    Days of Exercise per Week: 3 days     Minutes of Exercise per Session: 30 min  Stress: Stress Concern Present (08/18/2022)   [employer] of Occupational Health - Occupational Stress Questionnaire    Feeling of Stress : To some extent  Social Connections: Moderately Isolated (08/18/2022)   Social Connection and Isolation Panel [NHANES]    Frequency of Communication with Friends and Family: More than three times a week    Frequency of Social Gatherings with Friends and Family: More than three times a week    Attends Religious Services: Never    Database Administrator or Organizations: No    Attends Banker Meetings: Never    Marital Status: Married  Catering Manager Violence: Not At Risk (08/18/2022)   Humiliation, Afraid, Rape, and Kick questionnaire    Fear of Current or Ex-Partner: No    Emotionally Abused: No    Physically Abused: No    Sexually Abused: No   Social History   Tobacco Use  Smoking Status Every Day   Current packs/day: 0.25   Types: Cigarettes  Smokeless Tobacco Never  Tobacco Comments   1 pack every 3 days    Social History   Substance and Sexual Activity  Alcohol Use Yes   Alcohol/week: 3.0 standard drinks of alcohol   Types: 3 Glasses of wine per week    Family History:  Family History  Problem Relation Age of Onset   Dementia Mother    Anxiety disorder Mother    Hypertension Mother    Epilepsy Mother    Cancer Father        lung   Diabetes Brother    Colon cancer Brother    Hypertension Daughter    Cancer Daughter    Heart disease Maternal Grandfather        MI  Hypertension Brother    Diabetes Daughter    Arthritis Daughter    Breast cancer Neg Hx    Past medical history, surgical history, medications, allergies, family history and social history reviewed with patient today and changes made to appropriate areas of the chart.   Review of Systems - All other ROS negative except what is listed above and in the HPI.      Objective:    BP 125/71   Pulse 97    Temp 98.2 F (36.8 C) (Oral)   Ht 5' 2.5 (1.588 m)   Wt 100 lb 3.2 oz (45.5 kg)   SpO2 93%   BMI 18.03 kg/m   Wt Readings from Last 3 Encounters:  04/28/23 100 lb 3.2 oz (45.5 kg)  02/05/23 101 lb 6.4 oz (46 kg)  01/13/23 103 lb (46.7 kg)    Physical Exam Vitals and nursing note reviewed. Exam conducted with a chaperone present.  Constitutional:      General: She is awake. She is not in acute distress.    Appearance: She is well-developed and well-groomed. She is not ill-appearing or toxic-appearing.  HENT:     Head: Normocephalic and atraumatic.     Right Ear: Hearing, tympanic membrane, ear canal and external ear normal. No drainage.     Left Ear: Hearing, tympanic membrane, ear canal and external ear normal. No drainage.     Nose: Nose normal.     Right Sinus: No maxillary sinus tenderness or frontal sinus tenderness.     Left Sinus: No maxillary sinus tenderness or frontal sinus tenderness.     Mouth/Throat:     Mouth: Mucous membranes are moist.     Pharynx: Oropharynx is clear. Uvula midline. No pharyngeal swelling, oropharyngeal exudate or posterior oropharyngeal erythema.  Eyes:     General: Lids are normal.        Right eye: No discharge.        Left eye: No discharge.     Extraocular Movements: Extraocular movements intact.     Conjunctiva/sclera: Conjunctivae normal.     Pupils: Pupils are equal, round, and reactive to light.     Visual Fields: Right eye visual fields normal and left eye visual fields normal.  Neck:     Thyroid : No thyromegaly.     Vascular: No carotid bruit.     Trachea: Trachea normal.  Cardiovascular:     Rate and Rhythm: Normal rate and regular rhythm.     Heart sounds: Normal heart sounds. No murmur heard.    No gallop.  Pulmonary:     Effort: Pulmonary effort is normal. No accessory muscle usage or respiratory distress.     Breath sounds: Normal breath sounds.  Chest:  Breasts:    Right: Normal.     Left: Normal.  Abdominal:      General: Bowel sounds are normal.     Palpations: Abdomen is soft. There is no hepatomegaly or splenomegaly.     Tenderness: There is no abdominal tenderness.  Musculoskeletal:        General: Normal range of motion.     Cervical back: Normal range of motion and neck supple.     Right lower leg: No edema.     Left lower leg: No edema.  Lymphadenopathy:     Head:     Right side of head: No submental, submandibular, tonsillar, preauricular or posterior auricular adenopathy.     Left side of head: No submental, submandibular, tonsillar, preauricular or posterior  auricular adenopathy.     Cervical: No cervical adenopathy.     Upper Body:     Right upper body: No supraclavicular, axillary or pectoral adenopathy.     Left upper body: No supraclavicular, axillary or pectoral adenopathy.  Skin:    General: Skin is warm and dry.     Capillary Refill: Capillary refill takes less than 2 seconds.     Findings: No rash.  Neurological:     Mental Status: She is alert and oriented to person, place, and time.     Gait: Gait is intact.     Deep Tendon Reflexes: Reflexes are normal and symmetric.     Reflex Scores:      Brachioradialis reflexes are 2+ on the right side and 2+ on the left side.      Patellar reflexes are 2+ on the right side and 2+ on the left side. Psychiatric:        Attention and Perception: Attention normal.        Mood and Affect: Mood normal.        Speech: Speech normal.        Behavior: Behavior normal. Behavior is cooperative.        Thought Content: Thought content normal.        Judgment: Judgment normal.    Results for orders placed or performed in visit on 09/23/22  Comprehensive metabolic panel   Collection Time: 09/23/22 10:36 AM  Result Value Ref Range   Glucose 57 (L) 70 - 99 mg/dL   BUN 15 8 - 27 mg/dL   Creatinine, Ser 9.17 0.57 - 1.00 mg/dL   eGFR 75 >40 fO/fpw/8.26   BUN/Creatinine Ratio 18 12 - 28   Sodium 137 134 - 144 mmol/L   Potassium 4.7 3.5 -  5.2 mmol/L   Chloride 100 96 - 106 mmol/L   CO2 26 20 - 29 mmol/L   Calcium  9.6 8.7 - 10.3 mg/dL   Total Protein 6.8 6.0 - 8.5 g/dL   Albumin 4.4 3.8 - 4.8 g/dL   Globulin, Total 2.4 1.5 - 4.5 g/dL   Albumin/Globulin Ratio 1.8    Bilirubin Total 0.5 0.0 - 1.2 mg/dL   Alkaline Phosphatase 58 44 - 121 IU/L   AST 26 0 - 40 IU/L   ALT 21 0 - 32 IU/L  Lipid Panel w/o Chol/HDL Ratio   Collection Time: 09/23/22 10:36 AM  Result Value Ref Range   Cholesterol, Total 154 100 - 199 mg/dL   Triglycerides 76 0 - 149 mg/dL   HDL 99 >60 mg/dL   VLDL Cholesterol Cal 14 5 - 40 mg/dL   LDL Chol Calc (NIH) 41 0 - 99 mg/dL      Assessment & Plan:   Problem List Items Addressed This Visit       Cardiovascular and Mediastinum   Hypertension   Chronic, stable  BP at goal for age in office today.  Continue Olmesartan  at current dose and adjust as needed.  Would consider changing Effexor  to an SSRI due to effect of Effexor  on BP if elevations ever present.  She is to check BP daily at home and document.  Recommend complete cessation of smoking and focus on DASH diet.  LABS: CBC, TSH, CMP.  Refills sent.  Return to office in 6 months, she is to alert provider if any elevations >130/80 on BP at home and be seen sooner if present.      Relevant Medications   olmesartan  (BENICAR )  20 MG tablet   rosuvastatin  (CRESTOR ) 5 MG tablet   Other Relevant Orders   Microalbumin, Urine Waived   CBC with Differential/Platelet   Comprehensive metabolic panel   TSH     Respiratory   COPD (chronic obstructive pulmonary disease) (HCC) - Primary   Chronic, ongoing.  Spirometry on 10/16/21 stable FEV1 62% and FEV1/FVC 79%.  Improved with Anoro on board, continue this and Albuterol  as needed.  Minimally uses Albuterol .  Adjust regimen as needed.  Recommend complete cessation of smoking.  Will repeat spirometry next visit      Relevant Medications   albuterol  (VENTOLIN  HFA) 108 (90 Base) MCG/ACT inhaler    umeclidinium-vilanterol (ANORO ELLIPTA ) 62.5-25 MCG/ACT AEPB   Other Relevant Orders   CBC with Differential/Platelet     Digestive   GERD (gastroesophageal reflux disease)   Chronic, stable.  Continue Prilosec daily and adjust as needed.  Mag level annually. Risks of PPI use were discussed with patient including bone loss, C. Diff diarrhea, pneumonia, infections, CKD, electrolyte abnormalities. Verbalizes understanding and chooses to continue the medication.        Relevant Medications   omeprazole  (PRILOSEC) 20 MG capsule   Other Relevant Orders   Magnesium     Musculoskeletal and Integument   Osteoporosis   Ongoing, continue Fosamax .  May consider period off Fosamax  in future, at this time continue due to ongoing osteoporosis on imaging.  Vit D level annually.  Due for DEXA -- have ordered and recommend she schedule, provided her number to call.      Relevant Medications   alendronate  (FOSAMAX ) 70 MG tablet   Other Relevant Orders   VITAMIN D  25 Hydroxy (Vit-D Deficiency, Fractures)   DG Bone Density     Other   Depression   Chronic, stable.  Denies SI/HI.  Continue Effexor  75 MG daily as is beneficial at this time, could consider change to SSRI if elevation in BP ever presents.  Buspar  as needed.  At this time return in 6 months, sooner if worsening mood.      Relevant Medications   venlafaxine  XR (EFFEXOR -XR) 75 MG 24 hr capsule   Generalized anxiety disorder   Chronic, stable.  Denies SI/HI.  Continue current regimen and adjust as needed.      Relevant Medications   venlafaxine  XR (EFFEXOR -XR) 75 MG 24 hr capsule   Hyponatremia   Chronic in nature.  Recheck NA + today, has been adding salt to diet daily.  Is on Effexor  which may reduce NA+, may need to consider reduction of Effexor  in future.      Relevant Orders   Comprehensive metabolic panel   Mixed hyperlipidemia   Chronic, ongoing.  Tolerating Crestor , continue this daily and adjust as needed.  Lipid panel  today.      Relevant Medications   olmesartan  (BENICAR ) 20 MG tablet   rosuvastatin  (CRESTOR ) 5 MG tablet   Other Relevant Orders   Comprehensive metabolic panel   Lipid Panel w/o Chol/HDL Ratio   Nicotine dependence, cigarettes, uncomplicated   I have recommended complete cessation of tobacco use. I have discussed various options available for assistance with tobacco cessation including over the counter methods (Nicotine gum, patch and lozenges). We also discussed prescription options (Chantix, Nicotine Inhaler / Nasal Spray). The patient is not interested in pursuing any prescription tobacco cessation options at this time.  Referral to restart lung cancer screening placed last visit, she has not attended.       Other Visit Diagnoses  Screening for colon cancer       GI referral, she is aware this may not be needed based on age but she would like referral.   Relevant Orders   Ambulatory referral to Gastroenterology     Encounter for screening mammogram for malignant neoplasm of breast       Mammogram ordered today and she will schedule.   Relevant Orders   MM 3D SCREENING MAMMOGRAM BILATERAL BREAST     Encounter for annual physical exam       Annual physical today with labs and health maintenance reviewed, discussed with patient.        Follow up plan: Return in about 6 months (around 10/26/2023) for COPD, HTN/HLD, Depression.   LABORATORY TESTING:  - Pap smear: not applicable  IMMUNIZATIONS:   - Tdap: Tetanus vaccination status reviewed: last tetanus booster within 10 years. - Influenza: Up To Date - Pneumovax: Up to date - Prevnar: Up to date - HPV: Not applicable - Zostavax vaccine: Up to date - Covid == Up To Date  SCREENING: -Mammogram: Not Up To Date 09/27/21 -- she is aware to schedule - Colonoscopy: Up to date  - Bone Density: Not Up To Date -- she will schedule with mammogram -Hearing Test: Not applicable  -Spirometry: Up To Date  PATIENT COUNSELING:    Advised to take 1 mg of folate supplement per day if capable of pregnancy.   Sexuality: Discussed sexually transmitted diseases, partner selection, use of condoms, avoidance of unintended pregnancy  and contraceptive alternatives.   Advised to avoid cigarette smoking.  I discussed with the patient that most people either abstain from alcohol or drink within safe limits (<=14/week and <=4 drinks/occasion for males, <=7/weeks and <= 3 drinks/occasion for females) and that the risk for alcohol disorders and other health effects rises proportionally with the number of drinks per week and how often a drinker exceeds daily limits.  Discussed cessation/primary prevention of drug use and availability of treatment for abuse.   Diet: Encouraged to adjust caloric intake to maintain  or achieve ideal body weight, to reduce intake of dietary saturated fat and total fat, to limit sodium intake by avoiding high sodium foods and not adding table salt, and to maintain adequate dietary potassium and calcium  preferably from fresh fruits, vegetables, and low-fat dairy products.    Stressed the importance of regular exercise  Injury prevention: Discussed safety belts, safety helmets, smoke detector, smoking near bedding or upholstery.   Dental health: Discussed importance of regular tooth brushing, flossing, and dental visits.    NEXT PREVENTATIVE PHYSICAL DUE IN 1 YEAR. Return in about 6 months (around 10/26/2023) for COPD, HTN/HLD, Depression.

## 2023-04-29 ENCOUNTER — Other Ambulatory Visit: Payer: Self-pay | Admitting: Nurse Practitioner

## 2023-04-29 DIAGNOSIS — E871 Hypo-osmolality and hyponatremia: Secondary | ICD-10-CM

## 2023-04-29 LAB — CBC WITH DIFFERENTIAL/PLATELET
Basophils Absolute: 0.2 10*3/uL (ref 0.0–0.2)
Basos: 2 %
EOS (ABSOLUTE): 0.2 10*3/uL (ref 0.0–0.4)
Eos: 2 %
Hematocrit: 41.5 % (ref 34.0–46.6)
Hemoglobin: 13.6 g/dL (ref 11.1–15.9)
Immature Grans (Abs): 0 10*3/uL (ref 0.0–0.1)
Immature Granulocytes: 0 %
Lymphocytes Absolute: 2.9 10*3/uL (ref 0.7–3.1)
Lymphs: 30 %
MCH: 32.8 pg (ref 26.6–33.0)
MCHC: 32.8 g/dL (ref 31.5–35.7)
MCV: 100 fL — ABNORMAL HIGH (ref 79–97)
Monocytes Absolute: 0.9 10*3/uL (ref 0.1–0.9)
Monocytes: 9 %
Neutrophils Absolute: 5.6 10*3/uL (ref 1.4–7.0)
Neutrophils: 57 %
Platelets: 371 10*3/uL (ref 150–450)
RBC: 4.15 x10E6/uL (ref 3.77–5.28)
RDW: 11.1 % — ABNORMAL LOW (ref 11.7–15.4)
WBC: 9.8 10*3/uL (ref 3.4–10.8)

## 2023-04-29 LAB — COMPREHENSIVE METABOLIC PANEL
ALT: 25 [IU]/L (ref 0–32)
AST: 27 [IU]/L (ref 0–40)
Albumin: 4.4 g/dL (ref 3.8–4.8)
Alkaline Phosphatase: 72 [IU]/L (ref 44–121)
BUN/Creatinine Ratio: 23 (ref 12–28)
BUN: 21 mg/dL (ref 8–27)
Bilirubin Total: 0.5 mg/dL (ref 0.0–1.2)
CO2: 22 mmol/L (ref 20–29)
Calcium: 9.3 mg/dL (ref 8.7–10.3)
Chloride: 95 mmol/L — ABNORMAL LOW (ref 96–106)
Creatinine, Ser: 0.93 mg/dL (ref 0.57–1.00)
Globulin, Total: 2.2 g/dL (ref 1.5–4.5)
Glucose: 95 mg/dL (ref 70–99)
Potassium: 4.3 mmol/L (ref 3.5–5.2)
Sodium: 132 mmol/L — ABNORMAL LOW (ref 134–144)
Total Protein: 6.6 g/dL (ref 6.0–8.5)
eGFR: 64 mL/min/{1.73_m2} (ref >59)

## 2023-04-29 LAB — LIPID PANEL W/O CHOL/HDL RATIO
Cholesterol, Total: 135 mg/dL (ref 100–199)
HDL: 79 mg/dL (ref >39)
LDL Chol Calc (NIH): 41 mg/dL (ref 0–99)
Triglycerides: 78 mg/dL (ref 0–149)
VLDL Cholesterol Cal: 15 mg/dL (ref 5–40)

## 2023-04-29 LAB — MAGNESIUM: Magnesium: 2 mg/dL (ref 1.6–2.3)

## 2023-04-29 LAB — VITAMIN D 25 HYDROXY (VIT D DEFICIENCY, FRACTURES): Vit D, 25-Hydroxy: 41.5 ng/mL (ref 30.0–100.0)

## 2023-04-29 LAB — TSH: TSH: 3.1 u[IU]/mL (ref 0.450–4.500)

## 2023-04-29 NOTE — Progress Notes (Signed)
 Good morning, please let Lauren Nolan know her labs have returned and overall are stable with exception of sodium which is a little on low side like it likes to do.  Some of this can be related to Effexor  which can lower sodium levels, however you are stable on this and we will maintain it.  Add a little tablet salt to your diet.  I would like to recheck this via outpatient labs in 4 weeks, my staff will schedule this with you.  Any questions? Keep being stellar!!  Thank you for allowing me to participate in your care.  I appreciate you. Kindest regards, Javione Gunawan

## 2023-05-02 ENCOUNTER — Emergency Department: Payer: Medicare Other

## 2023-05-02 ENCOUNTER — Emergency Department
Admission: EM | Admit: 2023-05-02 | Discharge: 2023-05-02 | Disposition: A | Discharge status: Home / Self Care | Payer: Medicare Other | Attending: Student in an Organized Health Care Education/Training Program | Admitting: Student in an Organized Health Care Education/Training Program

## 2023-05-02 ENCOUNTER — Other Ambulatory Visit: Payer: Self-pay

## 2023-05-02 ENCOUNTER — Encounter: Payer: Self-pay | Admitting: Intensive Care

## 2023-05-02 DIAGNOSIS — W010XXA Fall on same level from slipping, tripping and stumbling without subsequent striking against object, initial encounter: Secondary | ICD-10-CM | POA: Diagnosis not present

## 2023-05-02 DIAGNOSIS — S52502A Unspecified fracture of the lower end of left radius, initial encounter for closed fracture: Secondary | ICD-10-CM | POA: Diagnosis not present

## 2023-05-02 DIAGNOSIS — S52612A Displaced fracture of left ulna styloid process, initial encounter for closed fracture: Secondary | ICD-10-CM | POA: Diagnosis not present

## 2023-05-02 DIAGNOSIS — M25522 Pain in left elbow: Secondary | ICD-10-CM | POA: Diagnosis not present

## 2023-05-02 DIAGNOSIS — M1812 Unilateral primary osteoarthritis of first carpometacarpal joint, left hand: Secondary | ICD-10-CM | POA: Diagnosis not present

## 2023-05-02 DIAGNOSIS — M19032 Primary osteoarthritis, left wrist: Secondary | ICD-10-CM | POA: Diagnosis not present

## 2023-05-02 DIAGNOSIS — S6992XA Unspecified injury of left wrist, hand and finger(s), initial encounter: Secondary | ICD-10-CM | POA: Diagnosis not present

## 2023-05-02 DIAGNOSIS — Y92013 Bedroom of single-family (private) house as the place of occurrence of the external cause: Secondary | ICD-10-CM | POA: Diagnosis not present

## 2023-05-02 DIAGNOSIS — S52322A Displaced transverse fracture of shaft of left radius, initial encounter for closed fracture: Secondary | ICD-10-CM | POA: Diagnosis not present

## 2023-05-02 DIAGNOSIS — S52552A Other extraarticular fracture of lower end of left radius, initial encounter for closed fracture: Secondary | ICD-10-CM | POA: Diagnosis not present

## 2023-05-02 DIAGNOSIS — M19022 Primary osteoarthritis, left elbow: Secondary | ICD-10-CM | POA: Diagnosis not present

## 2023-05-02 MED ORDER — OXYCODONE-ACETAMINOPHEN 5-325 MG PO TABS: 1.0000 | ORAL_TABLET | ORAL | 0 refills | Status: DC | PRN

## 2023-05-02 MED ORDER — BUPIVACAINE HCL (PF) 0.5 % IJ SOLN
30.0000 mL | Freq: Once | INTRAMUSCULAR | Status: AC
  Administered 2023-05-02: 30 mL
  Filled 2023-05-02: qty 30

## 2023-05-02 MED ORDER — MORPHINE SULFATE (PF) 4 MG/ML IV SOLN
4.0000 mg | INTRAVENOUS | Status: DC | PRN
  Administered 2023-05-02: 4 mg via INTRAVENOUS
  Filled 2023-05-02: qty 1

## 2023-05-02 MED ORDER — ONDANSETRON HCL 4 MG/2ML IJ SOLN
4.0000 mg | Freq: Once | INTRAMUSCULAR | Status: AC
  Administered 2023-05-02: 4 mg via INTRAVENOUS
  Filled 2023-05-02: qty 2

## 2023-05-02 NOTE — ED Notes (Signed)
Patient declined discharge vital signs. 

## 2023-05-02 NOTE — ED Triage Notes (Signed)
Patient presents after fall last night. Left hand/wrist very swollen and bruised. Deformity noted

## 2023-05-02 NOTE — ED Provider Notes (Signed)
[facility] Provider Note    Event Date/Time   First MD Initiated Contact with Patient 05/02/23 [id number]     (approximate)   History   Fall and Wrist Pain   HPI  Lauren Nolan is a 78 y.o. female who presents to the ER for evaluation of left wrist pain and deformity occurred after she tripped in her bedroom last night.  Denies hitting her head.  No other associated injury.  No numbness or tingling.     Physical Exam   Triage Vital Signs: ED Triage Vitals  Encounter Vitals Group     BP 05/02/23 0944 (!) 149/92     Systolic BP Percentile --      Diastolic BP Percentile --      Pulse Rate 05/02/23 0944 90     Resp 05/02/23 0944 20     Temp 05/02/23 0944 98.5 F (36.9 C)     Temp Source 05/02/23 0944 Oral     SpO2 05/02/23 0944 96 %     Weight 05/02/23 0945 100 lb (45.4 kg)     Height 05/02/23 0945 5\' 1"  (1.549 m)     Head Circumference --      Peak Flow --      Pain Score 05/02/23 0944 10     Pain Loc --      Pain Education --      Exclude from Growth Chart --     Most recent vital signs: Vitals:   05/02/23 0944  BP: (!) 149/92  Pulse: 90  Resp: 20  Temp: 98.5 F (36.9 C)  SpO2: 96%     Constitutional: Alert  Eyes: Conjunctivae are normal.  Head: Atraumatic. Nose: No congestion/rhinnorhea. Mouth/Throat: Mucous membranes are moist.   Neck: Painless ROM.  Cardiovascular:   Good peripheral circulation. Respiratory: Normal respiratory effort.  No retractions.  Gastrointestinal: Soft and nontender.  Musculoskeletal: Dinner fork deformity to left forearm.  Neurovascular intact distally. Neurologic:  MAE spontaneously. No gross focal neurologic deficits are appreciated.  Skin:  Skin is warm, dry and intact. No rash noted. Psychiatric: Mood and affect are normal. Speech and behavior are normal.    ED Results / Procedures / Treatments   Labs (all labs ordered are listed, but only abnormal results are displayed) Labs Reviewed - No  data to display   EKG     RADIOLOGY Please see ED Course for my review and interpretation.  I personally reviewed all radiographic images ordered to evaluate for the above acute complaints and reviewed radiology reports and findings.  These findings were personally discussed with the patient.  Please see medical record for radiology report.    PROCEDURES:  Critical Care performed:   .Ortho Injury Treatment  Date/Time: 05/02/2023 11:33 AM  Performed by: Willy Eddy, MD Authorized by: Willy Eddy, MD  Injury location: forearm Location details: left forearm Injury type: fracture Fracture type: distal radius and ulnar styloid Pre-procedure neurovascular assessment: neurovascularly intact Pre-procedure distal perfusion: normal Pre-procedure neurological function: normal Pre-procedure range of motion: reduced Anesthesia: hematoma block  Anesthesia: Local anesthesia used: yes Local Anesthetic: bupivacaine 0.5% without epinephrine Manipulation performed: yes Skin traction used: yes Reduction successful: yes X-ray confirmed reduction: yes Immobilization: splint Splint Applied by: ED Provider Supplies used: Ortho-Glass Post-procedure neurovascular assessment: post-procedure neurovascularly intact      MEDICATIONS ORDERED IN ED: Medications  morphine (PF) 4 MG/ML injection 4 mg (4 mg Intravenous Given 05/02/23 1044)  ondansetron (ZOFRAN) injection 4 mg (4 mg  Intravenous Given 05/02/23 1044)  bupivacaine(PF) (MARCAINE) 0.5 % injection 30 mL (30 mLs Infiltration Given by Other 05/02/23 1050)     IMPRESSION / MDM / ASSESSMENT AND PLAN / ED COURSE  I reviewed the triage vital signs and the nursing notes.                              Differential diagnosis includes, but is not limited to, fracture, dislocation, contusion  78 y.o. female acute left with wrist injury. Denies any other injuries. Denies motor or sensory loss. Denies paresthesias. VSS in ED. Exam  as above. NV intact throughout and distal to injury. Cap refill <2 seconds. Pt able to range joint.  Patient with evidence of fracture.    Clinical Course as of 05/02/23 1208  Sat May 02, 2023  1018 X-ray of forearm my review and interpretation with evidence of mildly displaced distal radius fracture. [PR]    Clinical Course User Index [PR] Willy Eddy, MD   Patient tolerated reduction well.  Postreduction x-ray with improved alignment.  She is appropriate for outpatient follow-up.  FINAL CLINICAL IMPRESSION(S) / ED DIAGNOSES   Final diagnoses:  Closed fracture of distal end of left radius, unspecified fracture morphology, initial encounter     Rx / DC Orders   ED Discharge Orders          Ordered    oxyCODONE-acetaminophen (PERCOCET) 5-325 MG tablet  Every 4 hours PRN        05/02/23 1206             Note:  This document was prepared using Dragon voice recognition software and may include unintentional dictation errors.    Willy Eddy, MD 05/02/23 [phone]

## 2023-05-08 ENCOUNTER — Telehealth: Payer: Self-pay | Admitting: *Deleted

## 2023-05-08 NOTE — Progress Notes (Signed)
Transition Care Management Unsuccessful Follow-up Telephone Call  Date of discharge and from where:  [facility]  05/12/2023  Attempts:  1st Attempt  Reason for unsuccessful TCM follow-up call: left message

## 2023-05-11 ENCOUNTER — Telehealth: Payer: Self-pay | Admitting: *Deleted

## 2023-05-11 NOTE — Progress Notes (Signed)
\  Complex Care Management Note Care Guide Note  05/11/2023 Name: CHARLOT GOUIN MRN: [id number] DOB: May 13, 1945   Complex Care Management Outreach Attempts: A second unsuccessful outreach was attempted today to offer the patient with information about available complex care management services.  Follow Up Plan:  Additional outreach attempts will be made to offer the patient complex care management information and services.   Encounter Outcome:  No Answer  [facility] Care Guide  Direct Dial:[phone] Fax:[phone] Website: [email or web address]

## 2023-05-13 ENCOUNTER — Encounter: Payer: Self-pay | Admitting: *Deleted

## 2023-05-17 NOTE — Patient Instructions (Signed)

## 2023-05-19 ENCOUNTER — Ambulatory Visit: Payer: Medicare Other | Admitting: Nurse Practitioner

## 2023-05-19 ENCOUNTER — Encounter: Payer: Self-pay | Admitting: Nurse Practitioner

## 2023-05-19 VITALS — BP 114/65 | HR 99 | Temp 98.0°F | Ht 62.5 in | Wt 101.2 lb

## 2023-05-19 DIAGNOSIS — E871 Hypo-osmolality and hyponatremia: Secondary | ICD-10-CM

## 2023-05-19 DIAGNOSIS — M79602 Pain in left arm: Secondary | ICD-10-CM

## 2023-05-19 DIAGNOSIS — S52502A Unspecified fracture of the lower end of left radius, initial encounter for closed fracture: Secondary | ICD-10-CM | POA: Diagnosis not present

## 2023-05-19 DIAGNOSIS — Z8781 Personal history of (healed) traumatic fracture: Secondary | ICD-10-CM | POA: Insufficient documentation

## 2023-05-19 MED ORDER — HYDROCODONE-ACETAMINOPHEN 5-325 MG PO TABS: 1.0000 | ORAL_TABLET | Freq: Four times a day (QID) | ORAL | 0 refills | Status: AC | PRN

## 2023-05-19 NOTE — Assessment & Plan Note (Signed)
Acute on 05/01/23 after fall.  Currently in soft cast and sling, she reports ER told her she may need surgery.  Will place urgent referral to ortho as when she called to schedule they told her she had to see PCP first.  Discussed with patient.

## 2023-05-19 NOTE — Progress Notes (Signed)
 BP 114/65   Pulse 99   Temp 98 F (36.7 C) (Oral)   Ht 5' 2.5 (1.588 m)   Wt 101 lb 3.2 oz (45.9 kg)   SpO2 95%   BMI 18.21 kg/m    Subjective:    Patient ID: Lauren Nolan, female    DOB: 07-17-1945, 78 y.o.   MRN: [id number]  HPI: Lauren Nolan is a 78 y.o. female  Chief Complaint  Patient presents with   Follow-up    Patient states she had a fall on 05/02/23, states she lost her balance and fell backwards. States he broke her L wrist when she fell. States she has taken all of the prescribed Oxycodone , takes 800 mg of ibuprofen when needed for pain.    ER FOLLOW UP Had a fall on 05/01/23, then went to hospital next morning. During fall she was taking a couple steps backwards, then lost balance with fall backwards.  Fell on left hand/arm. Has closed fracture of distal end of left radius.  Currently has soft cast in place and sling.  Fingers to left hand remain slightly swollen.  Was ordered Oxycodone  in ER, but has completed these. Time since discharge:  Hospital/facility: [facility] Diagnosis: closed fracture of distal end of left radius Procedures/tests: imaging performed Consultants: ER, no consults New medications: Oxycodone , has completed Discharge instructions:  Follow-up with PCP and ortho Status: fluctuating   Relevant past medical, surgical, family and social history reviewed and updated as indicated. Interim medical history since our last visit reviewed. Allergies and medications reviewed and updated.  Review of Systems  Constitutional:  Negative for activity change, appetite change, diaphoresis, fatigue and fever.  Respiratory:  Negative for cough, chest tightness and shortness of breath.   Cardiovascular:  Negative for chest pain, palpitations and leg swelling.  Gastrointestinal: Negative.   Musculoskeletal:  Positive for arthralgias.  Neurological: Negative.   Psychiatric/Behavioral: Negative.      Per HPI unless specifically indicated above     Objective:     BP 114/65   Pulse 99   Temp 98 F (36.7 C) (Oral)   Ht 5' 2.5 (1.588 m)   Wt 101 lb 3.2 oz (45.9 kg)   SpO2 95%   BMI 18.21 kg/m   Wt Readings from Last 3 Encounters:  05/19/23 101 lb 3.2 oz (45.9 kg)  05/02/23 100 lb (45.4 kg)  04/28/23 100 lb 3.2 oz (45.5 kg)    Physical Exam Vitals and nursing note reviewed.  Constitutional:      General: She is awake. She is not in acute distress.    Appearance: She is well-developed and well-groomed. She is not ill-appearing or toxic-appearing.  HENT:     Head: Normocephalic.     Right Ear: Hearing and external ear normal.     Left Ear: Hearing and external ear normal.  Eyes:     General: Lids are normal.        Right eye: No discharge.        Left eye: No discharge.     Conjunctiva/sclera: Conjunctivae normal.     Pupils: Pupils are equal, round, and reactive to light.  Neck:     Thyroid : No thyromegaly.     Vascular: No carotid bruit.  Cardiovascular:     Rate and Rhythm: Normal rate and regular rhythm.     Heart sounds: Normal heart sounds. No murmur heard.    No gallop.  Pulmonary:     Effort: Pulmonary effort is normal. No  accessory muscle usage or respiratory distress.     Breath sounds: Normal breath sounds.  Abdominal:     General: Bowel sounds are normal. There is no distension.     Palpations: Abdomen is soft.     Tenderness: There is no abdominal tenderness.  Musculoskeletal:     Right forearm: Normal.     Right wrist: Normal.     Right hand: Normal.     Cervical back: Normal range of motion and neck supple.     Right lower leg: No edema.     Left lower leg: No edema.     Comments: Left lower arm with soft cast and sling in place.  Fingers sensitive to touch and cap refill <3 seconds.  Able to wiggle fingers.  Mild edema to fingers.  Lymphadenopathy:     Cervical: No cervical adenopathy.  Skin:    General: Skin is warm and dry.  Neurological:     Mental Status: She is alert and oriented to person, place,  and time.     Deep Tendon Reflexes: Reflexes are normal and symmetric.     Reflex Scores:      Brachioradialis reflexes are 2+ on the right side and 2+ on the left side.      Patellar reflexes are 2+ on the right side and 2+ on the left side. Psychiatric:        Attention and Perception: Attention normal.        Mood and Affect: Mood normal.        Speech: Speech normal.        Behavior: Behavior normal. Behavior is cooperative.        Thought Content: Thought content normal.    Results for orders placed or performed in visit on 04/28/23  Microalbumin, Urine Waived   Collection Time: 04/28/23  3:20 PM  Result Value Ref Range   Microalb, Ur Waived 10 0 - 19 mg/L   Creatinine, Urine Waived 50 10 - 300 mg/dL   Microalb/Creat Ratio 30-300 (H) <30 mg/g  CBC with Differential/Platelet   Collection Time: 04/28/23  3:21 PM  Result Value Ref Range   WBC 9.8 3.4 - 10.8 x10E3/uL   RBC 4.15 3.77 - 5.28 x10E6/uL   Hemoglobin 13.6 11.1 - 15.9 g/dL   Hematocrit 58.4 65.9 - 46.6 %   MCV 100 (H) 79 - 97 fL   MCH 32.8 26.6 - 33.0 pg   MCHC 32.8 31.5 - 35.7 g/dL   RDW 88.8 (L) 88.2 - 84.5 %   Platelets 371 150 - 450 x10E3/uL   Neutrophils 57 Not Estab. %   Lymphs 30 Not Estab. %   Monocytes 9 Not Estab. %   Eos 2 Not Estab. %   Basos 2 Not Estab. %   Neutrophils Absolute 5.6 1.4 - 7.0 x10E3/uL   Lymphocytes Absolute 2.9 0.7 - 3.1 x10E3/uL   Monocytes Absolute 0.9 0.1 - 0.9 x10E3/uL   EOS (ABSOLUTE) 0.2 0.0 - 0.4 x10E3/uL   Basophils Absolute 0.2 0.0 - 0.2 x10E3/uL   Immature Granulocytes 0 Not Estab. %   Immature Grans (Abs) 0.0 0.0 - 0.1 x10E3/uL  Comprehensive metabolic panel   Collection Time: 04/28/23  3:21 PM  Result Value Ref Range   Glucose 95 70 - 99 mg/dL   BUN 21 8 - 27 mg/dL   Creatinine, Ser 9.06 0.57 - 1.00 mg/dL   eGFR 64 >40 fO/fpw/8.26   BUN/Creatinine Ratio 23 12 - 28   Sodium 132 (  L) 134 - 144 mmol/L   Potassium 4.3 3.5 - 5.2 mmol/L   Chloride 95 (L) 96 - 106  mmol/L   CO2 22 20 - 29 mmol/L   Calcium  9.3 8.7 - 10.3 mg/dL   Total Protein 6.6 6.0 - 8.5 g/dL   Albumin 4.4 3.8 - 4.8 g/dL   Globulin, Total 2.2 1.5 - 4.5 g/dL   Bilirubin Total 0.5 0.0 - 1.2 mg/dL   Alkaline Phosphatase 72 44 - 121 IU/L   AST 27 0 - 40 IU/L   ALT 25 0 - 32 IU/L  Lipid Panel w/o Chol/HDL Ratio   Collection Time: 04/28/23  3:21 PM  Result Value Ref Range   Cholesterol, Total 135 100 - 199 mg/dL   Triglycerides 78 0 - 149 mg/dL   HDL 79 >60 mg/dL   VLDL Cholesterol Cal 15 5 - 40 mg/dL   LDL Chol Calc (NIH) 41 0 - 99 mg/dL  TSH   Collection Time: 04/28/23  3:21 PM  Result Value Ref Range   TSH 3.100 0.450 - 4.500 uIU/mL  Magnesium   Collection Time: 04/28/23  3:21 PM  Result Value Ref Range   Magnesium 2.0 1.6 - 2.3 mg/dL  VITAMIN D  25 Hydroxy (Vit-D Deficiency, Fractures)   Collection Time: 04/28/23  3:21 PM  Result Value Ref Range   Vit D, 25-Hydroxy 41.5 30.0 - 100.0 ng/mL      Assessment & Plan:   Problem List Items Addressed This Visit       Musculoskeletal and Integument   Closed fracture of left distal radius   Acute on 05/01/23 after fall.  Currently in soft cast and sling, she reports ER told her she may need surgery.  Will place urgent referral to ortho as when she called to schedule they told her she had to see PCP first.  Discussed with patient.      Relevant Orders   Ambulatory referral to Orthopedic Surgery     Other   Hyponatremia   Chronic in nature.  Recheck NA + today, has been adding salt to diet daily.  Is on Effexor  which may reduce NA+, may need to consider reduction of Effexor  in future.      Relevant Orders   Sodium   Left arm pain - Primary   Refer to radial fracture plan of care.      Relevant Orders   Ambulatory referral to Orthopedic Surgery     Follow up plan: Return for as scheduled in future.

## 2023-05-19 NOTE — Assessment & Plan Note (Signed)
Refer to radial fracture plan of care.

## 2023-05-19 NOTE — Assessment & Plan Note (Signed)
Chronic in nature.  Recheck NA + today, has been adding salt to diet daily.  Is on Effexor which may reduce NA+, may need to consider reduction of Effexor in future. 

## 2023-05-20 LAB — SODIUM: Sodium: 136 mmol/L (ref 134–144)

## 2023-05-20 NOTE — Progress Notes (Signed)
 Good morning please call [name] and let her know: Good morning Lauren Nolan, your labs have returned and overall sodium level is normal.  No changes needed.  Have a great day!!  Also Albino, our referral coordinator tried to call her yesterday and she is scheduled for [facility] -- please call them to find out date and time as you are scheduled. Keep being amazing!!  Thank you for allowing me to participate in your care.  I appreciate you. Kindest regards, Rockwell Zentz

## 2023-05-21 DIAGNOSIS — S52532A Colles' fracture of left radius, initial encounter for closed fracture: Secondary | ICD-10-CM | POA: Diagnosis not present

## 2023-05-22 ENCOUNTER — Ambulatory Visit: Payer: Self-pay

## 2023-05-22 NOTE — Telephone Encounter (Signed)
Called and notified patient of Jolene's message. Patient verbalized understanding.

## 2023-05-22 NOTE — Telephone Encounter (Signed)
    Chief Complaint: Left arm pain. Fracture 05/01/23. [facility] put a hard cast on me yesterday. Symptoms: Pain 10/10, Norco only lasts a couple of hours. Frequency: Above Pertinent Negatives: Patient denies  Disposition: [] ED /[] Urgent Care (no appt availability in office) / [] Appointment(In office/virtual)/ []  [place] [facility]/ [] Home Care/ [] Refused Recommended Disposition /[] [facility]/ [x]  Follow-up with PCP Additional Notes: Please advise pt.  Reason for Disposition  Arm pain is a chronic symptom (recurrent or ongoing AND present > 4 weeks)  Answer Assessment - Initial Assessment Questions 1. ONSET: When did the pain start?     05/01/23 2. LOCATION: Where is the pain located?     Left 3. PAIN: How bad is the pain? (Scale 1-10; or mild, moderate, severe)   - MILD (1-3): Doesn't interfere with normal activities.   - MODERATE (4-7): Interferes with normal activities (e.g., work or school) or awakens from sleep.   - SEVERE (8-10): Excruciating pain, unable to do any normal activities, unable to hold a cup of water.     10 4. WORK OR EXERCISE: Has there been any recent work or exercise that involved this part of the body?     Fracture 5. CAUSE: What do you think is causing the arm pain?     Above 6. OTHER SYMPTOMS: Do you have any other symptoms? (e.g., neck pain, swelling, rash, fever, numbness, weakness)     No 7. PREGNANCY: Is there any chance you are pregnant? When was your last menstrual period?     no  Protocols used: Arm Pain-A-AH

## 2023-05-27 ENCOUNTER — Other Ambulatory Visit: Payer: Self-pay

## 2023-06-09 ENCOUNTER — Encounter: Payer: Self-pay | Admitting: Dermatology

## 2023-06-09 ENCOUNTER — Ambulatory Visit: Payer: Medicare Other | Admitting: Dermatology

## 2023-06-09 DIAGNOSIS — D239 Other benign neoplasm of skin, unspecified: Secondary | ICD-10-CM

## 2023-06-09 DIAGNOSIS — D2372 Other benign neoplasm of skin of left lower limb, including hip: Secondary | ICD-10-CM | POA: Diagnosis not present

## 2023-06-09 NOTE — Progress Notes (Signed)
   New Patient Visit   Subjective  Lauren Nolan is a 78 y.o. female who presents for the following: spot at L hip been there x a year and 6 months, not itchy, not bleeding, not painful. Pt reports spot has not grown in size. Pt was referred by her PCP. Pt denies any personal or family hx of skin cancer.   Pt accompanied by spouse.  The patient has spots, moles and lesions to be evaluated, some may be new or changing and the patient may have concern these could be cancer.   The following portions of the chart were reviewed this encounter and updated as appropriate: medications, allergies, medical history  Review of Systems:  No other skin or systemic complaints except as noted in HPI or Assessment and Plan.  Objective  Well appearing patient in no apparent distress; mood and affect are within normal limits.   A focused examination was performed of the following areas: L hip  Relevant exam findings are noted in the Assessment and Plan.    Assessment & Plan    Dilated pore of winer/EPIDERMAL INCLUSION CYST Exam: Subcutaneous nodule at L hip  Benign-appearing. Exam most consistent with an epidermal inclusion cyst. Discussed that a cyst is a benign growth that can grow over time and sometimes get irritated or inflamed. Recommend observation if it is not bothersome. Discussed option of surgical excision to remove it if it is growing, symptomatic, or other changes noted. Please call for new or changing lesions so they can be evaluated.    Treatment plan: Monitor, if becomes inflamed or bothering patient will call and let us know.  DILATED PORE OF Francee Piccolo    Return if symptoms worsen or fail to improve, for w/ Dr. Katrinka Blazing.  Wynonia Lawman, CMA, am acting as scribe for Elie Goody, MD .   Documentation: I have reviewed the above documentation for accuracy and completeness, and I agree with the above.  Elie Goody, MD

## 2023-06-09 NOTE — Patient Instructions (Signed)

## 2023-06-11 DIAGNOSIS — S52532A Colles' fracture of left radius, initial encounter for closed fracture: Secondary | ICD-10-CM | POA: Diagnosis not present

## 2023-06-13 IMAGING — DX DG SHOULDER 2+V*L*
3 series · 3 of 3 positions shown · non-contrast
Comparison: None.

CLINICAL DATA: Fall, left shoulder pain

EXAM:
LEFT SHOULDER - 2+ VIEW

[shoulder ap]
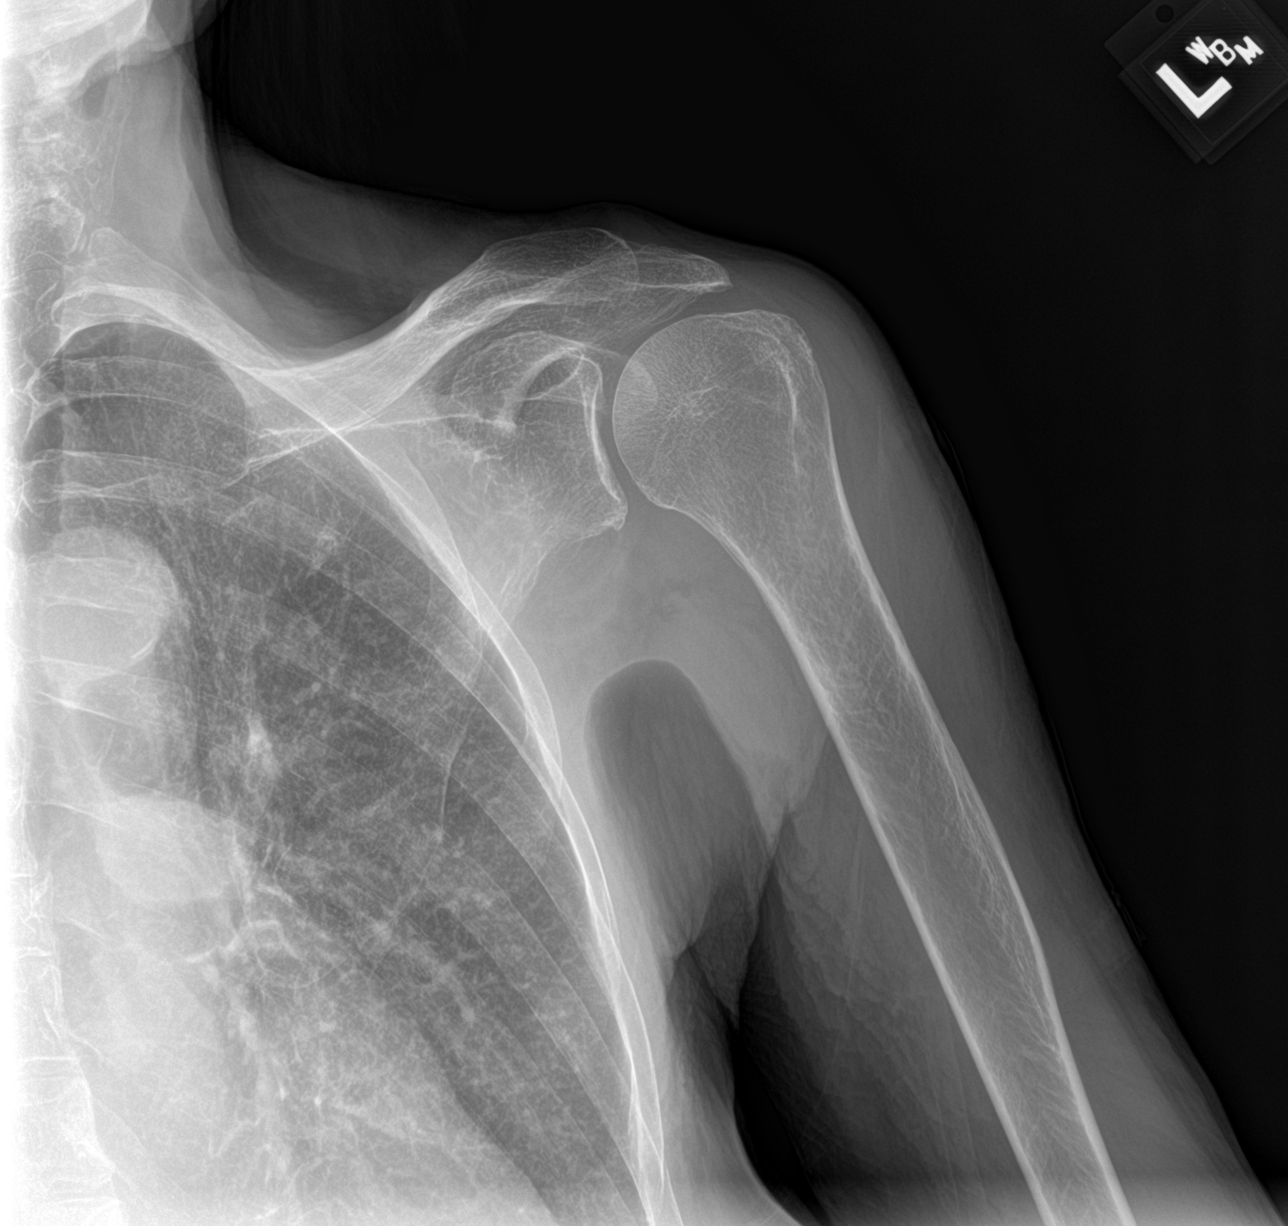

[shoulder y-view]
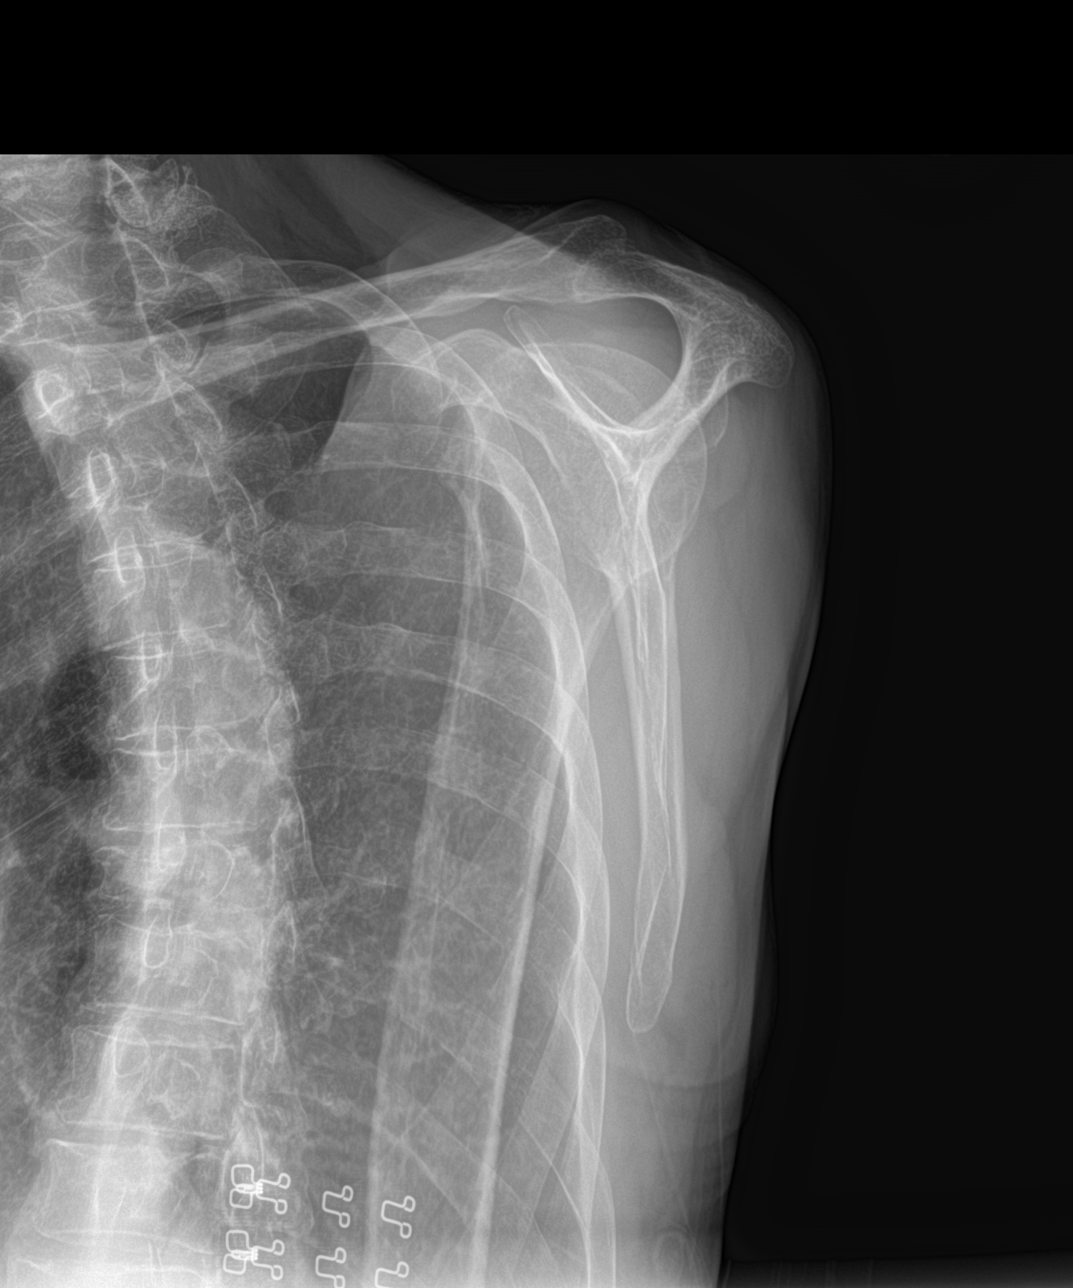

[shoulder axial]
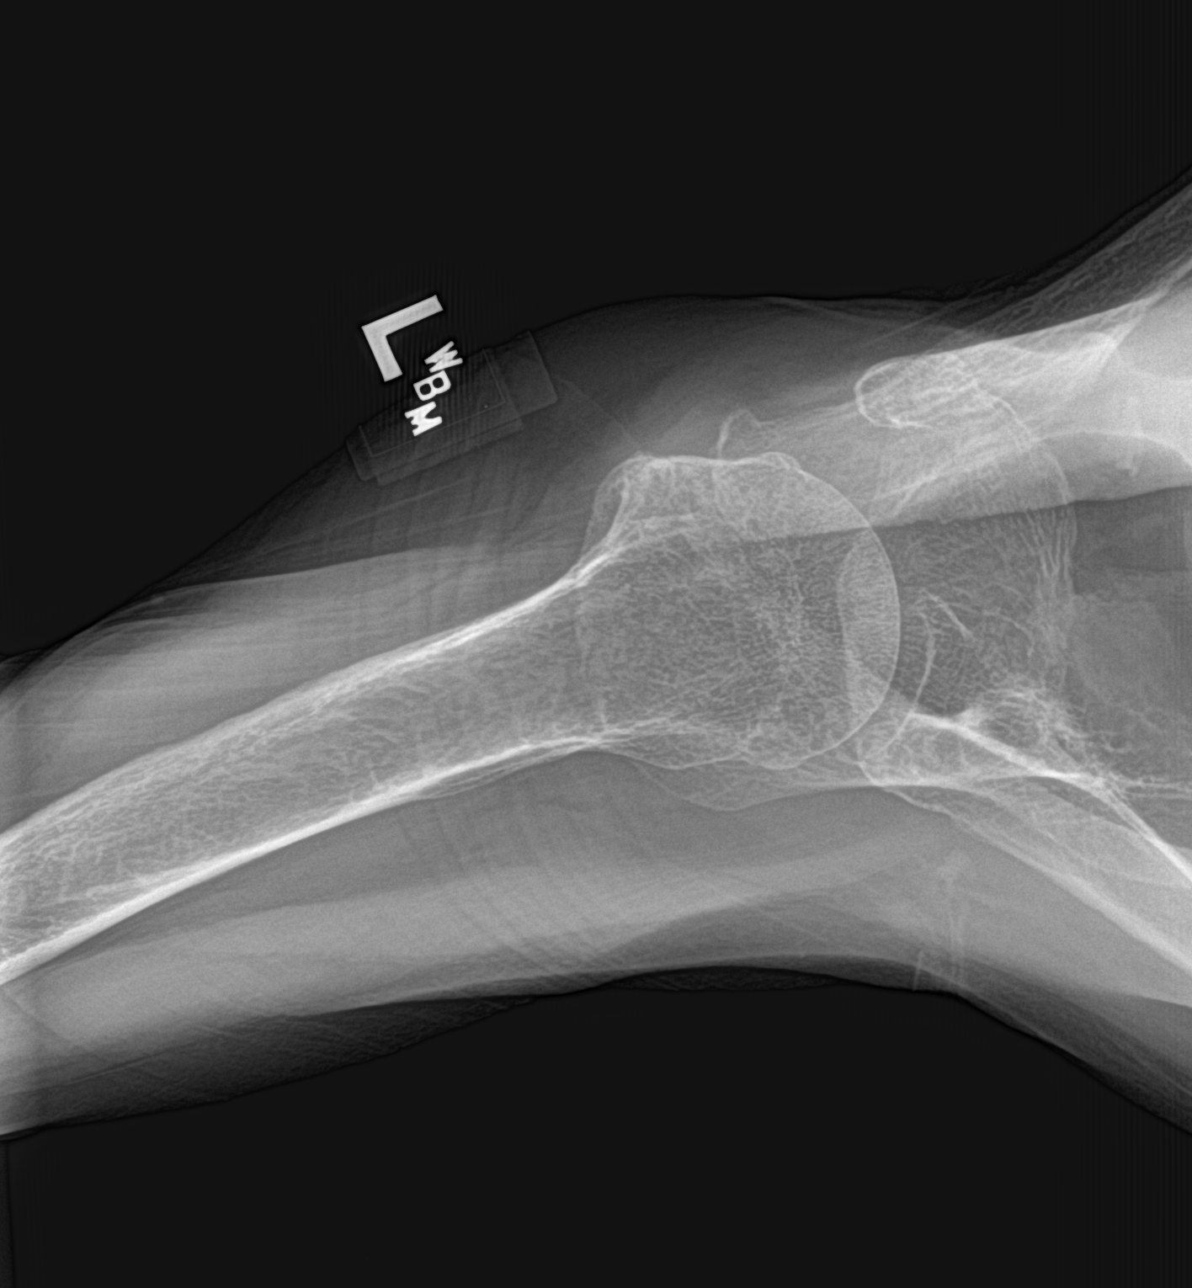

[3 of 3 positions shown; findings below may reference images not displayed]

FINDINGS: There is no evidence of fracture or dislocation. There is no
evidence of arthropathy or other focal bone abnormality. Soft
tissues are unremarkable.
IMPRESSION: No fracture or dislocation of the left shoulder. Joint spaces are
preserved.

## 2023-09-03 ENCOUNTER — Ambulatory Visit (INDEPENDENT_AMBULATORY_CARE_PROVIDER_SITE_OTHER): Payer: Self-pay | Admitting: Emergency Medicine

## 2023-09-03 ENCOUNTER — Other Ambulatory Visit: Payer: Self-pay

## 2023-09-03 ENCOUNTER — Telehealth: Payer: Self-pay

## 2023-09-03 VITALS — Ht 62.0 in | Wt 98.0 lb

## 2023-09-03 DIAGNOSIS — Z0000 Encounter for general adult medical examination without abnormal findings: Secondary | ICD-10-CM

## 2023-09-03 DIAGNOSIS — Z860100 Personal history of colon polyps, unspecified: Secondary | ICD-10-CM

## 2023-09-03 DIAGNOSIS — Z1211 Encounter for screening for malignant neoplasm of colon: Secondary | ICD-10-CM

## 2023-09-03 MED ORDER — NA SULFATE-K SULFATE-MG SULF 17.5-3.13-1.6 GM/177ML PO SOLN: 1.0000 | Freq: Once | ORAL | 0 refills | Status: AC

## 2023-09-03 NOTE — Patient Instructions (Signed)
 Ms. Birchmeier , Thank you for taking time out of your busy schedule to complete your Annual Wellness Visit with me. I enjoyed our conversation and look forward to speaking with you again next year. I, as well as your care team,  appreciate your ongoing commitment to your health goals. Please review the following plan we discussed and let me know if I can assist you in the future. Your Game plan/ To Do List    Referrals: If you haven't heard from the office you've been referred to, please reach out to them at the phone provided.  Referral placed to [facility] for a colonoscopy (was due 10/03/22). Their ph# is [phone]. Follow up Visits: Next Medicare AWV with our clinical staff: 09/15/24 @ 9:20 (phone visit)   Have you seen your provider in the last 6 months (3 months if uncontrolled diabetes)? Yes Next Office Visit with your provider: 10/27/23 @ 10:40am with Jolene Cannady, NP  Clinician Recommendations: Call [facility] at [phone] to schedule a mammogram and bone density test at your earliest convenience.  Aim for 30 minutes of exercise or brisk walking, 6-8 glasses of water, and 5 servings of fruits and vegetables each day.       This is a list of the screening recommended for you and due dates:  Health Maintenance  Topic Date Due   Screening for Lung Cancer  07/20/2008   DEXA scan (bone density measurement)  07/18/2022   Mammogram  09/28/2022   Colon Cancer Screening  10/03/2022   COVID-19 Vaccine (5 - 2024-25 season) 12/14/2022   Flu Shot  11/13/2023   Medicare Annual Wellness Visit  09/02/2024   DTaP/Tdap/Td vaccine (3 - Td or Tdap) 10/03/2030   Pneumonia Vaccine  Completed   Hepatitis C Screening  Completed   Zoster (Shingles) Vaccine  Completed   HPV Vaccine  Aged Out   Meningitis B Vaccine  Aged Out    Advanced directives: (Copy Requested) Please bring a copy of your health care power of attorney and living will to the office to be added to your chart at your  convenience. You can mail to [facility] [address] [place], [place] [address] or email to [email or web address] Advance Care Planning is important because it:  [x]  Makes sure you receive the medical care that is consistent with your values, goals, and preferences  [x]  It provides guidance to your family and loved ones and reduces their decisional burden about whether or not they are making the right decisions based on your wishes.  Follow the link provided in your after visit summary or read over the paperwork we have mailed to you to help you started getting your Advance Directives in place. If you need assistance in completing these, please reach out to us  so that we can help you!  See attachments for Preventive Care and Fall Prevention Tips.   Fall Prevention in the Home, Adult Falls can cause injuries and affect people of all ages. There are many simple things that you can do to make your home safe and to help prevent falls. If you need it, ask for help making these changes. What actions can I take to prevent falls? General information Use good lighting in all rooms. Make sure to: Replace any light bulbs that burn out. Turn on lights if it is dark and use night-lights. Keep items that you use often in easy-to-reach places. Lower the shelves around your home if needed. Move furniture so that  there are clear paths around it. Do not keep throw rugs or other things on the floor that can make you trip. If any of your floors are uneven, fix them. Add color or contrast paint or tape to clearly mark and help you see: Grab bars or handrails. First and last steps of staircases. Where the edge of each step is. If you use a ladder or stepladder: Make sure that it is fully opened. Do not climb a closed ladder. Make sure the sides of the ladder are locked in place. Have someone hold the ladder while you use it. Know where your pets are as you move through your  home. What can I do in the bathroom?     Keep the floor dry. Clean up any water that is on the floor right away. Remove soap buildup in the bathtub or shower. Buildup makes bathtubs and showers slippery. Use non-skid mats or decals on the floor of the bathtub or shower. Attach bath mats securely with double-sided, non-slip rug tape. If you need to sit down while you are in the shower, use a non-slip stool. Install grab bars by the toilet and in the bathtub and shower. Do not use towel bars as grab bars. What can I do in the bedroom? Make sure that you have a light by your bed that is easy to reach. Do not use any sheets or blankets on your bed that hang to the floor. Have a firm bench or chair with side arms that you can use for support when you get dressed. What can I do in the kitchen? Clean up any spills right away. If you need to reach something above you, use a sturdy step stool that has a grab bar. Keep electrical cables out of the way. Do not use floor polish or wax that makes floors slippery. What can I do with my stairs? Do not leave anything on the stairs. Make sure that you have a light switch at the top and the bottom of the stairs. Have them installed if you do not have them. Make sure that there are handrails on both sides of the stairs. Fix handrails that are broken or loose. Make sure that handrails are as long as the staircases. Install non-slip stair treads on all stairs in your home if they do not have carpet. Avoid having throw rugs at the top or bottom of stairs, or secure the rugs with carpet tape to prevent them from moving. Choose a carpet design that does not hide the edge of steps on the stairs. Make sure that carpet is firmly attached to the stairs. Fix any carpet that is loose or worn. What can I do on the outside of my home? Use bright outdoor lighting. Repair the edges of walkways and driveways and fix any cracks. Clear paths of anything that can make you  trip, such as tools or rocks. Add color or contrast paint or tape to clearly mark and help you see high doorway thresholds. Trim any bushes or trees on the main path into your home. Check that handrails are securely fastened and in good repair. Both sides of all steps should have handrails. Install guardrails along the edges of any raised decks or porches. Have leaves, snow, and ice cleared regularly. Use sand, salt, or ice melt on walkways during winter months if you live where there is ice and snow. In the garage, clean up any spills right away, including grease or oil spills. What other actions  can I take? Review your medicines with your health care provider. Some medicines can make you confused or feel dizzy. This can increase your chance of falling. Wear closed-toe shoes that fit well and support your feet. Wear shoes that have rubber soles and low heels. Use a cane, walker, scooter, or crutches that help you move around if needed. Talk with your provider about other ways that you can decrease your risk of falls. This may include seeing a physical therapist to learn to do exercises to improve movement and strength. Where to find more information Centers for Disease Control and Prevention, STEADI: TonerPromos.no [employer] on Aging: BaseRingTones.pl National Institute on Aging: BaseRingTones.pl Contact a health care provider if: You are afraid of falling at home. You feel weak, drowsy, or dizzy at home. You fall at home. Get help right away if you: Lose consciousness or have trouble moving after a fall. Have a fall that causes a head injury. These symptoms may be an emergency. Get help right away. Call 911. Do not wait to see if the symptoms will go away. Do not drive yourself to the hospital. This information is not intended to replace advice given to you by your health care provider. Make sure you discuss any questions you have with your health care provider. Document Revised: 12/02/2021  Document Reviewed: 12/02/2021 Elsevier Patient Education  2024 [employer].

## 2023-09-03 NOTE — Progress Notes (Signed)
 Subjective:   Lauren Nolan is a 78 y.o. who presents for a Medicare Wellness preventive visit.  As a reminder, Annual Wellness Visits don't include a physical exam, and some assessments may be limited, especially if this visit is performed virtually. We may recommend an in-person follow-up visit with your provider if needed.  Visit Complete: Virtual I connected with  Fatina Sprankle Gaut on 09/03/23 by a audio enabled telemedicine application and verified that I am speaking with the correct person using two identifiers.  Patient Location: Home  Provider Location: Home Office  I discussed the limitations of evaluation and management by telemedicine. The patient expressed understanding and agreed to proceed.  Vital Signs: Because this visit was a virtual/telehealth visit, some criteria may be missing or patient reported. Any vitals not documented were not able to be obtained and vitals that have been documented are patient reported.  VideoDeclined- This patient declined Librarian, academic. Therefore the visit was completed with audio only.  Persons Participating in Visit: Patient.  AWV Questionnaire: No: Patient Medicare AWV questionnaire was not completed prior to this visit.  Cardiac Risk Factors include: advanced age (>1men, >64 women);dyslipidemia;hypertension;smoking/ tobacco exposure     Objective:     Today's Vitals   09/03/23 0917  Weight: 98 lb (44.5 kg)  Height: 5\' 2"  (1.575 m)   Body mass index is 17.92 kg/m.     09/03/2023    9:41 AM 05/02/2023    9:47 AM 08/18/2022   10:17 AM 02/08/2022    2:58 PM 08/15/2021    9:34 AM 08/13/2020    9:50 AM 10/03/2019    9:11 AM  Advanced Directives  Does Patient Have a Medical Advance Directive? Yes No No No Yes Yes No  Type of Estate agent of [place];Living will    Living will Living will;Healthcare Power of Attorney   Does patient want to make changes to medical advance directive?  No - Patient declined        Copy of Healthcare Power of Attorney in Chart? No - copy requested     No - copy requested   Would patient like information on creating a medical advance directive?  No - Patient declined No - Patient declined No - Patient declined No - Patient declined  No - Patient declined    Current Medications (verified) Outpatient Encounter Medications as of 09/03/2023  Medication Sig   albuterol  (VENTOLIN  HFA) 108 (90 Base) MCG/ACT inhaler Inhale 1-2 puffs into the lungs every 6 (six) hours as needed for wheezing or shortness of breath.   alendronate  (FOSAMAX ) 70 MG tablet Take 1 tablet (70 mg total) by mouth once a week. Take with a full glass of water on an empty stomach.   aspirin 81 MG tablet Take 81 mg by mouth daily.   lidocaine  (LIDODERM ) 5 % Place 1 patch onto the skin daily. Remove & Discard patch within 12 hours or as directed by MD   olmesartan  (BENICAR ) 20 MG tablet Take 1 tablet (20 mg total) by mouth daily.   omeprazole  (PRILOSEC) 20 MG capsule Take 1 capsule (20 mg total) by mouth daily.   rosuvastatin  (CRESTOR ) 5 MG tablet Take 1 tablet (5 mg total) by mouth daily.   umeclidinium-vilanterol (ANORO ELLIPTA ) 62.5-25 MCG/ACT AEPB Inhale 1 puff into the lungs daily at 6 (six) AM.   venlafaxine  XR (EFFEXOR -XR) 75 MG 24 hr capsule Take 1 capsule (75 mg total) by mouth daily with breakfast.   VITAMIN  D, CHOLECALCIFEROL, PO Take by mouth.   busPIRone  (BUSPAR ) 5 MG tablet Take 2 tablets (10 mg total) by mouth 2 (two) times daily. (Patient not taking: Reported on 09/03/2023)   Turmeric (QC TUMERIC COMPLEX PO) Take by mouth. (Patient not taking: Reported on 09/03/2023)   No facility-administered encounter medications on file as of 09/03/2023.    Allergies (verified) Elemental sulfur, Midol [acetaminophen ], and Sulfa antibiotics   History: Past Medical History:  Diagnosis Date   Allergy    Anxiety    COPD (chronic obstructive pulmonary disease) (HCC)    Depression     GERD (gastroesophageal reflux disease)    Hypertension    Internal hemorrhoid    Lichen sclerosus    Osteoporosis    Spinal stenosis    Tobacco use    Past Surgical History:  Procedure Laterality Date   BREAST EXCISIONAL BIOPSY Bilateral    1980's - benign   BREAST SURGERY Bilateral    biopsy   COLONOSCOPY WITH PROPOFOL  N/A 10/03/2019   Procedure: COLONOSCOPY WITH PROPOFOL ;  Surgeon: Luke Salaam, MD;  Location[facility] ENDOSCOPY;  Service: Gastroenterology;  Laterality: N/A;   EYE SURGERY Bilateral Feb-Mar 2016   HERNIA REPAIR     TUBAL LIGATION     Family History  Problem Relation Age of Onset   Dementia Mother    Anxiety disorder Mother    Hypertension Mother    Epilepsy Mother    Cancer Father        lung   Diabetes Brother    Colon cancer Brother    Hypertension Daughter    Cancer Daughter    Heart disease Maternal Grandfather        MI   Hypertension Brother    Diabetes Daughter    Arthritis Daughter    Breast cancer Neg Hx    Social History   Socioeconomic History   Marital status: Married    Spouse name: Sports administrator   Number of children: 4   Years of education: Not on file   Highest education level: 8th grade  Occupational History   Occupation: retired  Tobacco Use   Smoking status: Every Day    Current packs/day: 0.50    Average packs/day: 0.5 packs/day for 57.4 years (28.7 ttl pk-yrs)    Types: Cigarettes    Start date: 1968    Passive exposure: Past   Smokeless tobacco: Never   Tobacco comments:    1 pack every 3 days 09/03/23 smokes 1/2 ppd  Vaping Use   Vaping status: Every Day   Substances: Nicotine, Flavoring  Substance and Sexual Activity   Alcohol use: Yes    Alcohol/week: 7.0 standard drinks of alcohol    Types: 7 Glasses of wine per week    Comment: 1 glass of wine daily at bedtime   Drug use: No   Sexual activity: Yes  Other Topics Concern   Not on file  Social History Narrative   Retired, has daughter living at home with her and  her husband    09/03/23 4 children, 3 living and 1 deceased and 4 step-children   Social Drivers of Corporate investment banker Strain: Low Risk  (09/03/2023)   Overall Financial Resource Strain (CARDIA)    Difficulty of Paying Living Expenses: Not hard at all  Food Insecurity: No Food Insecurity (09/03/2023)   Hunger Vital Sign    Worried About Running Out of Food in the Last Year: Never true    Ran Out of Food in  the Last Year: Never true  Transportation Needs: No Transportation Needs (09/03/2023)   PRAPARE - Administrator, Civil Service (Medical): No    Lack of Transportation (Non-Medical): No  Physical Activity: Inactive (09/03/2023)   Exercise Vital Sign    Days of Exercise per Week: 0 days    Minutes of Exercise per Session: 0 min  Stress: Stress Concern Present (09/03/2023)   [employer] of Occupational Health - Occupational Stress Questionnaire    Feeling of Stress : Rather much  Social Connections: Moderately Integrated (09/03/2023)   Social Connection and Isolation Panel [NHANES]    Frequency of Communication with Friends and Family: More than three times a week    Frequency of Social Gatherings with Friends and Family: More than three times a week    Attends Religious Services: More than 4 times per year    Active Member of [employer] or Organizations: No    Attends Banker Meetings: Never    Marital Status: Married    Tobacco Counseling Ready to quit: Not Answered Counseling given: Not Answered Tobacco comments: 1 pack every 3 days 09/03/23 smokes 1/2 ppd    Clinical Intake:  Pre-visit preparation completed: Yes  Pain : No/denies pain     BMI - recorded: 17.92 Nutritional Status: BMI <19  Underweight Nutritional Risks: None Diabetes: No  Lab Results  Component Value Date   HGBA1C 5.4 10/30/2020     How often do you need to have someone help you when you read instructions, pamphlets, or other written materials from your doctor or  pharmacy?: 1 - Never  Interpreter Needed?: No  Information entered by :: Jaunita Messier, CMA   Activities of Daily Living     09/03/2023    9:19 AM 04/28/2023    3:03 PM  In your present state of health, do you have any difficulty performing the following activities:  Hearing? 0 0  Vision? 0 0  Difficulty concentrating or making decisions? 0 0  Walking or climbing stairs? 1 0  Comment sometimes has balance problems   Dressing or bathing? 0 0  Doing errands, shopping? 0 0  Preparing Food and eating ? N   Using the Toilet? N   In the past six months, have you accidently leaked urine? N   Do you have problems with loss of bowel control? N   Managing your Medications? N   Managing your Finances? N   Housekeeping or managing your Housekeeping? N     Patient Care Team: Cannady, Jolene T, NP as PCP - General (Nurse Practitioner) Annell Kidney, MD as Referring Physician (Ophthalmology) Algie Ingle, OD (Optometry)  Indicate any recent Medical Services you may have received from other than [facility] providers in the past year (date may be approximate).     Assessment:    This is a routine wellness examination for Aala.  Hearing/Vision screen Hearing Screening - Comments:: Denies hearing loss Vision Screening - Comments:: Gets routine eye exams, Dr. Ignatius Makos, [place]   Goals Addressed             This Visit's Progress    Patient Stated       Be able to do more things at home while caring for husband       Depression Screen     09/03/2023    9:37 AM 05/19/2023   10:26 AM 04/28/2023    2:58 PM 02/05/2023   10:15 AM 01/13/2023    2:57 PM 09/23/2022  10:27 AM 08/18/2022   10:16 AM  PHQ 2/9 Scores  PHQ - 2 Score 0 0 0 0 1 1 0  PHQ- 9 Score 2 0 0 0 1 2 0    Fall Risk     09/03/2023    9:44 AM 05/19/2023   10:25 AM 04/28/2023    2:58 PM 01/13/2023    2:57 PM 08/18/2022   10:19 AM  Fall Risk   Falls in the past year? 1 0 0 0 0  Number falls in past yr: 1  0 0 0 0  Injury with Fall? 1 0 0 0 0  Risk for fall due to : History of fall(s);Impaired balance/gait;Orthopedic patient No Fall Risks No Fall Risks No Fall Risks No Fall Risks  Follow up Falls evaluation completed;Education provided Falls evaluation completed Falls evaluation completed Falls evaluation completed Falls prevention discussed;Falls evaluation completed    MEDICARE RISK AT HOME:  Medicare Risk at Home Any stairs in or around the home?: Yes If so, are there any without handrails?: No Home free of loose throw rugs in walkways, pet beds, electrical cords, etc?: Yes Adequate lighting in your home to reduce risk of falls?: Yes Life alert?: Yes Use of a cane, walker or w/c?: No Grab bars in the bathroom?: Yes Shower chair or bench in shower?: Yes Elevated toilet seat or a handicapped toilet?: Yes  TIMED UP AND GO:  Was the test performed?  No  Cognitive Function: 6CIT completed        09/03/2023    9:45 AM 08/18/2022   10:23 AM 08/15/2021    9:35 AM 08/13/2020    9:55 AM 06/07/2018    9:30 AM  6CIT Screen  What Year? 0 points 0 points 0 points 0 points 0 points  What month? 0 points 0 points 0 points 0 points 0 points  What time? 0 points 0 points 0 points 0 points 0 points  Count back from 20 0 points 0 points 0 points 0 points 0 points  Months in reverse 4 points 4 points 4 points 4 points 0 points  Repeat phrase 8 points 0 points 6 points 10 points 0 points  Total Score 12 points 4 points 10 points 14 points 0 points    Immunizations Immunization History  Administered Date(s) Administered   Fluad Quad(high Dose 65+) 01/07/2019, 01/30/2021, 01/13/2022   Influenza, High Dose Seasonal PF 12/16/2017   Influenza-Unspecified 01/11/2015, 01/11/2016, 12/31/2016, 01/06/2020, 03/03/2022, 03/02/2023   Moderna Covid-19 Fall Seasonal Vaccine 64yrs & older 04/22/2022   Moderna Sars-Covid-2 Vaccination 05/10/2019, 06/08/2019, 04/26/2020   Pneumococcal Conjugate-13 08/22/2013    Pneumococcal Polysaccharide-23 07/05/2014   Tdap 06/07/2010, 10/02/2020   Zoster Recombinant(Shingrix) 01/20/2019, 04/25/2019   Zoster, Live 04/11/2008    Screening Tests Health Maintenance  Topic Date Due   DEXA SCAN  07/18/2022   MAMMOGRAM  09/28/2022   Colonoscopy  10/03/2022   COVID-19 Vaccine (5 - 2024-25 season) 12/14/2022   INFLUENZA VACCINE  11/13/2023   Medicare Annual Wellness (AWV)  09/02/2024   DTaP/Tdap/Td (3 - Td or Tdap) 10/03/2030   Pneumonia Vaccine 70+ Years old  Completed   Hepatitis C Screening  Completed   Zoster Vaccines- Shingrix  Completed   HPV VACCINES  Aged Out   Meningococcal B Vaccine  Aged Out    Health Maintenance  Health Maintenance Due  Topic Date Due   DEXA SCAN  07/18/2022   MAMMOGRAM  09/28/2022   Colonoscopy  10/03/2022   COVID-19 Vaccine (5 -  2024-25 season) 12/14/2022   Health Maintenance Items Addressed: Referral sent to GI for colonoscopy, See Nurse Notes  Additional Screening:  Vision Screening: Recommended annual ophthalmology exams for early detection of glaucoma and other disorders of the eye.  Dental Screening: Recommended annual dental exams for proper oral hygiene  Community Resource Referral / Chronic Care Management: CRR required this visit?  No   CCM required this visit?  No   Plan:    I have personally reviewed and noted the following in the patient's chart:   Medical and social history Use of alcohol, tobacco or illicit drugs  Current medications and supplements including opioid prescriptions. Patient is not currently taking opioid prescriptions. Functional ability and status Nutritional status Physical activity Advanced directives List of other physicians Hospitalizations, surgeries, and ER visits in previous 12 months Vitals Screenings to include cognitive, depression, and falls Referrals and appointments  In addition, I have reviewed and discussed with patient certain preventive protocols, quality  metrics, and best practice recommendations. A written personalized care plan for preventive services as well as general preventive health recommendations were provided to patient.   Jaunita Messier, CMA   09/03/2023   After Visit Summary: (Mail) Due to this being a telephonic visit, the after visit summary with patients personalized plan was offered to patient via mail   Notes: Please refer to Routing Comments.

## 2023-09-03 NOTE — Telephone Encounter (Signed)
 Gastroenterology Pre-Procedure Review  Request Date: 10/29/23 Requesting Physician: Dr. Ole Berkeley  PATIENT REVIEW QUESTIONS: The patient responded to the following health history questions as indicated:    1. Are you having any GI issues? no 2. Do you have a personal history of Polyps? yes (yes last) 3. Do you have a family history of Colon Cancer or Polyps? no 4. Diabetes Mellitus? no 5. Joint replacements in the past 12 months?no 6. Major health problems in the past 3 months?no 7. Any artificial heart valves, MVP, or defibrillator?no    MEDICATIONS & ALLERGIES:    Patient reports the following regarding taking any anticoagulation/antiplatelet therapy:   Plavix, Coumadin, Eliquis, Xarelto, Lovenox, Pradaxa, Brilinta, or Effient? no Aspirin? no  Patient confirms/reports the following medications:  Current Outpatient Medications  Medication Sig Dispense Refill   albuterol  (VENTOLIN  HFA) 108 (90 Base) MCG/ACT inhaler Inhale 1-2 puffs into the lungs every 6 (six) hours as needed for wheezing or shortness of breath. 18 g 3   alendronate  (FOSAMAX ) 70 MG tablet Take 1 tablet (70 mg total) by mouth once a week. Take with a full glass of water on an empty stomach. 12 tablet 4   aspirin 81 MG tablet Take 81 mg by mouth daily.     busPIRone  (BUSPAR ) 5 MG tablet Take 2 tablets (10 mg total) by mouth 2 (two) times daily. (Patient not taking: Reported on 09/03/2023) 240 tablet 3   lidocaine  (LIDODERM ) 5 % Place 1 patch onto the skin daily. Remove & Discard patch within 12 hours or as directed by MD 30 patch 0   olmesartan  (BENICAR ) 20 MG tablet Take 1 tablet (20 mg total) by mouth daily. 90 tablet 4   omeprazole  (PRILOSEC) 20 MG capsule Take 1 capsule (20 mg total) by mouth daily. 90 capsule 4   rosuvastatin  (CRESTOR ) 5 MG tablet Take 1 tablet (5 mg total) by mouth daily. 90 tablet 4   Turmeric (QC TUMERIC COMPLEX PO) Take by mouth. (Patient not taking: Reported on 09/03/2023)     umeclidinium-vilanterol  (ANORO ELLIPTA ) 62.5-25 MCG/ACT AEPB Inhale 1 puff into the lungs daily at 6 (six) AM. 60 each 4   venlafaxine  XR (EFFEXOR -XR) 75 MG 24 hr capsule Take 1 capsule (75 mg total) by mouth daily with breakfast. 90 capsule 4   VITAMIN D , CHOLECALCIFEROL, PO Take by mouth.     No current facility-administered medications for this visit.    Patient confirms/reports the following allergies:  Allergies  Allergen Reactions   Elemental Sulfur    Midol [Acetaminophen ] Hypertension   Sulfa Antibiotics     No orders of the defined types were placed in this encounter.   AUTHORIZATION INFORMATION Primary Insurance: 1D#: Group #:  Secondary Insurance: 1D#: Group #:  SCHEDULE INFORMATION: Date: 10/29/23 Time: Location: [facility]

## 2023-10-25 NOTE — Patient Instructions (Incomplete)
 COPD and Physical Activity Chronic obstructive pulmonary disease (COPD) is a long-term, or chronic, condition that affects the lungs. COPD is a general term that can be used to describe many problems that cause inflammation of the lungs and limit airflow. These conditions include chronic bronchitis and emphysema. The main symptom of COPD is shortness of breath, which makes it harder to do even simple tasks. This can also make it harder to exercise and stay active. Talk with your health care provider about treatments to help you breathe better and actions you can take to prevent breathing problems during physical activity. What are the benefits of exercising when you have COPD? Exercising regularly is an important part of a healthy lifestyle. You can still exercise and do physical activities even though you have COPD. Exercise and physical activity improve your shortness of breath by increasing blood flow (circulation). This causes your heart to pump more oxygen through your body. Moderate exercise can: Improve oxygen use. Increase your energy level. Help with shortness of breath. Strengthen your breathing muscles. Improve heart health. Help with sleep. Improve your self-esteem and feelings of self-worth. Lower depression, stress, and anxiety. Exercise can benefit everyone with COPD. The severity of your disease may affect how hard you can exercise, especially at first, but everyone can benefit. Talk with your health care provider about how much exercise is safe for you, and which activities and exercises are safe for you. What actions can I take to prevent breathing problems during physical activity? Sign up for a pulmonary rehabilitation program. This type of program may include: Education about lung diseases. Exercise classes that teach you how to exercise and be more active while improving your breathing. This usually involves: Exercise using your lower extremities, such as a stationary  bicycle. About 30 minutes of exercise, 2 to 5 times per week, for 6 to 12 weeks. Strength training, such as push-ups or leg lifts. Nutrition education. Group classes in which you can talk with others who also have COPD and learn ways to manage stress. If you use an oxygen tank, you should use it while you exercise. Work with your health care provider to adjust your oxygen for your physical activity. Your resting flow rate is different from your flow rate during physical activity. How to manage your breathing while exercising While you are exercising: Take slow breaths. Pace yourself, and do nottry to go too fast. Purse your lips while breathing out. Pursing your lips is similar to a kissing or whistling position. If doing exercise that uses a quick burst of effort, such as weight lifting: Breathe in before starting the exercise. Breathe out during the hardest part of the exercise, such as raising the weights. Where to find support You can find support for exercising with COPD from: Your health care provider. A pulmonary rehabilitation program. Your local health department or community health programs. Support groups, either online or in-person. Your health care provider may be able to recommend support groups. Where to find more information You can find more information about exercising with COPD from: American Lung Association: lung.org COPD Foundation: copdfoundation.org Contact a health care provider if: Your symptoms get worse. You have nausea. You have a fever. You want to start a new exercise program or a new activity. Get help right away if: You have chest pain. You cannot breathe. These symptoms may represent a serious problem that is an emergency. Do not wait to see if the symptoms will go away. Get medical help right away. Call  your local emergency services (911 in the [place] Do not drive yourself to the hospital. Summary COPD is a general term that can be used to describe  many different lung problems that cause lung inflammation and limit airflow. This includes chronic bronchitis and emphysema. Exercise and physical activity improve your shortness of breath by increasing blood flow (circulation). This causes your heart to provide more oxygen to your body. Contact your health care provider before starting any exercise program or new activity. Ask your health care provider what exercises and activities are safe for you. This information is not intended to replace advice given to you by your health care provider. Make sure you discuss any questions you have with your health care provider. Document Revised: 02/13/2023 Document Reviewed: 02/13/2023 Elsevier Patient Education  2024 [employer].

## 2023-10-27 ENCOUNTER — Ambulatory Visit: Payer: Self-pay | Admitting: Nurse Practitioner

## 2023-10-27 DIAGNOSIS — M810 Age-related osteoporosis without current pathological fracture: Secondary | ICD-10-CM

## 2023-10-27 DIAGNOSIS — E782 Mixed hyperlipidemia: Secondary | ICD-10-CM

## 2023-10-27 DIAGNOSIS — F329 Major depressive disorder, single episode, unspecified: Secondary | ICD-10-CM

## 2023-10-27 DIAGNOSIS — I10 Essential (primary) hypertension: Secondary | ICD-10-CM

## 2023-10-27 DIAGNOSIS — E871 Hypo-osmolality and hyponatremia: Secondary | ICD-10-CM

## 2023-10-27 DIAGNOSIS — F411 Generalized anxiety disorder: Secondary | ICD-10-CM

## 2023-10-27 DIAGNOSIS — J410 Simple chronic bronchitis: Secondary | ICD-10-CM

## 2023-10-28 ENCOUNTER — Encounter: Payer: Self-pay | Admitting: Gastroenterology

## 2023-10-29 ENCOUNTER — Ambulatory Visit: Admitting: Certified Registered"

## 2023-10-29 ENCOUNTER — Other Ambulatory Visit: Payer: Self-pay

## 2023-10-29 ENCOUNTER — Encounter
Admission: RE | Disposition: A | Discharge status: Home / Self Care | Payer: Self-pay | Source: Home / Self Care | Attending: Gastroenterology

## 2023-10-29 ENCOUNTER — Ambulatory Visit
Admission: RE | Admit: 2023-10-29 | Discharge: 2023-10-29 | Disposition: A | Discharge status: Home / Self Care | Attending: Gastroenterology | Admitting: Gastroenterology

## 2023-10-29 ENCOUNTER — Encounter: Payer: Self-pay | Admitting: Gastroenterology

## 2023-10-29 DIAGNOSIS — K649 Unspecified hemorrhoids: Secondary | ICD-10-CM | POA: Diagnosis not present

## 2023-10-29 DIAGNOSIS — Z1211 Encounter for screening for malignant neoplasm of colon: Secondary | ICD-10-CM | POA: Diagnosis not present

## 2023-10-29 DIAGNOSIS — J449 Chronic obstructive pulmonary disease, unspecified: Secondary | ICD-10-CM | POA: Insufficient documentation

## 2023-10-29 DIAGNOSIS — K5730 Diverticulosis of large intestine without perforation or abscess without bleeding: Secondary | ICD-10-CM | POA: Diagnosis not present

## 2023-10-29 DIAGNOSIS — F17210 Nicotine dependence, cigarettes, uncomplicated: Secondary | ICD-10-CM | POA: Insufficient documentation

## 2023-10-29 DIAGNOSIS — K635 Polyp of colon: Secondary | ICD-10-CM | POA: Diagnosis not present

## 2023-10-29 DIAGNOSIS — D123 Benign neoplasm of transverse colon: Secondary | ICD-10-CM | POA: Diagnosis not present

## 2023-10-29 DIAGNOSIS — F17290 Nicotine dependence, other tobacco product, uncomplicated: Secondary | ICD-10-CM | POA: Diagnosis not present

## 2023-10-29 DIAGNOSIS — I10 Essential (primary) hypertension: Secondary | ICD-10-CM | POA: Diagnosis not present

## 2023-10-29 DIAGNOSIS — Z860100 Personal history of colon polyps, unspecified: Secondary | ICD-10-CM

## 2023-10-29 DIAGNOSIS — K641 Second degree hemorrhoids: Secondary | ICD-10-CM | POA: Insufficient documentation

## 2023-10-29 DIAGNOSIS — D122 Benign neoplasm of ascending colon: Secondary | ICD-10-CM | POA: Diagnosis not present

## 2023-10-29 HISTORY — PX: POLYPECTOMY: SHX149

## 2023-10-29 HISTORY — PX: COLONOSCOPY: SHX5424

## 2023-10-29 SURGERY — COLONOSCOPY
Anesthesia: General

## 2023-10-29 MED ORDER — LIDOCAINE HCL (CARDIAC) PF 100 MG/5ML IV SOSY
PREFILLED_SYRINGE | INTRAVENOUS | Status: DC | PRN
  Administered 2023-10-29: 50 mg via INTRAVENOUS

## 2023-10-29 MED ORDER — SODIUM CHLORIDE 0.9 % IV SOLN: INTRAVENOUS | Status: DC

## 2023-10-29 MED ORDER — PROPOFOL 500 MG/50ML IV EMUL
INTRAVENOUS | Status: DC | PRN
Start: 2023-10-29 — End: 2023-10-29
  Administered 2023-10-29: 125 ug/kg/min via INTRAVENOUS

## 2023-10-29 MED ORDER — PROPOFOL 10 MG/ML IV BOLUS
INTRAVENOUS | Status: DC | PRN
  Administered 2023-10-29: 50 mg via INTRAVENOUS
  Administered 2023-10-29: 40 mg via INTRAVENOUS

## 2023-10-29 NOTE — Anesthesia Preprocedure Evaluation (Signed)
 Anesthesia Evaluation  Patient identified by MRN, date of birth, ID band Patient awake    Reviewed: Allergy & Precautions, H&P , NPO status , Patient's Chart, lab work & pertinent test results, reviewed documented beta blocker date and time   History of Anesthesia Complications Negative for: history of anesthetic complications  Airway Mallampati: I  TM Distance: >3 FB Neck ROM: full    Dental  (+) Dental Advidsory Given, Upper Dentures, Edentulous Upper   Pulmonary neg shortness of breath, neg sleep apnea, COPD,  COPD inhaler, neg recent URI, Current Smoker and Patient abstained from smoking.   Pulmonary exam normal breath sounds clear to auscultation       Cardiovascular Exercise Tolerance: Good hypertension, (-) angina (-) Past MI and (-) Cardiac Stents Normal cardiovascular exam(-) dysrhythmias (-) Valvular Problems/Murmurs Rhythm:regular Rate:Normal     Neuro/Psych  PSYCHIATRIC DISORDERS Anxiety Depression    negative neurological ROS     GI/Hepatic Neg liver ROS,GERD  ,,  Endo/Other  negative endocrine ROS    Renal/GU      Musculoskeletal   Abdominal   Peds  Hematology negative hematology ROS (+)   Anesthesia Other Findings Past Medical History: No date: Allergy No date: Anxiety No date: COPD (chronic obstructive pulmonary disease) (HCC) No date: Depression No date: GERD (gastroesophageal reflux disease) No date: Hypertension No date: Internal hemorrhoid No date: Lichen sclerosus No date: Osteoporosis No date: Spinal stenosis No date: Tobacco use   Reproductive/Obstetrics negative OB ROS                              Anesthesia Physical Anesthesia Plan  ASA: 3  Anesthesia Plan: General   Post-op Pain Management: Minimal or no pain anticipated   Induction: Intravenous  PONV Risk Score and Plan: 3 and Propofol  infusion, TIVA and Ondansetron   Airway Management Planned:  Nasal Cannula  Additional Equipment: None  Intra-op Plan:   Post-operative Plan:   Informed Consent: I have reviewed the patients History and Physical, chart, labs and discussed the procedure including the risks, benefits and alternatives for the proposed anesthesia with the patient or authorized representative who has indicated his/her understanding and acceptance.     Dental advisory given  Plan Discussed with: CRNA and Surgeon  Anesthesia Plan Comments: (Discussed risks of anesthesia with patient, including possibility of difficulty with spontaneous ventilation under anesthesia necessitating airway intervention, PONV, and rare risks such as cardiac or respiratory or neurological events, and allergic reactions. Discussed the role of CRNA in patient's perioperative care. Patient understands.)        Anesthesia Quick Evaluation

## 2023-10-29 NOTE — Op Note (Signed)
 [facility] Gastroenterology Patient Name: Lauren Nolan Procedure Date: 10/29/2023 10:54 AM MRN: [id number] Account #: [id number] Date of Birth: 1946-01-06 Admit Type: Outpatient Age: 78 Room: [facility] ENDO ROOM 4 Gender: Female Note Status: Finalized Instrument Name: Arvis [id number] Procedure:             Colonoscopy Indications:           High risk colon cancer surveillance: Personal history                         of colonic polyps Providers:             Rogelia Copping MD, MD Referring MD:          Melanie DASEN. Cannady (Referring MD) Medicines:             Propofol  per Anesthesia Complications:         No immediate complications. Procedure:             Pre-Anesthesia Assessment:                        - Prior to the procedure, a History and Physical was                         performed, and patient medications and allergies were                         reviewed. The patient's tolerance of previous                         anesthesia was also reviewed. The risks and benefits                         of the procedure and the sedation options and risks                         were discussed with the patient. All questions were                         answered, and informed consent was obtained. Prior                         Anticoagulants: The patient has taken no anticoagulant                         or antiplatelet agents. ASA Grade Assessment: II - A                         patient with mild systemic disease. After reviewing                         the risks and benefits, the patient was deemed in                         satisfactory condition to undergo the procedure.                        After obtaining informed consent, the colonoscope was  passed under direct vision. Throughout the procedure,                         the patient's blood pressure, pulse, and oxygen                         saturations were monitored continuously. The                          Colonoscope was introduced through the anus and                         advanced to the the cecum, identified by appendiceal                         orifice and ileocecal valve. The colonoscopy was                         performed without difficulty. The patient tolerated                         the procedure well. The quality of the bowel                         preparation was excellent. Findings:      The perianal and digital rectal examinations were normal.      Two sessile polyps were found in the ascending colon. The polyps were 3       to 4 mm in size. These polyps were removed with a cold snare. Resection       and retrieval were complete.      A 3 mm polyp was found in the transverse colon. The polyp was sessile.       The polyp was removed with a cold snare. Resection and retrieval were       complete.      Many small-mouthed diverticula were found in the entire colon.      Non-bleeding internal hemorrhoids were found during retroflexion. The       hemorrhoids were Grade II (internal hemorrhoids that prolapse but reduce       spontaneously). Impression:            - Two 3 to 4 mm polyps in the ascending colon, removed                         with a cold snare. Resected and retrieved.                        - One 3 mm polyp in the transverse colon, removed with                         a cold snare. Resected and retrieved.                        - Diverticulosis in the entire examined colon.                        - Non-bleeding internal hemorrhoids. Recommendation:        - Discharge patient to home.                        -  Resume previous diet.                        - Continue present medications.                        - Await pathology results.                        - Repeat colonoscopy is not recommended for                         surveillance. Procedure Code(s):     --- Professional ---                        [phone], Colonoscopy, flexible; with removal  of                         tumor(s), polyp(s), or other lesion(s) by snare                         technique Diagnosis Code(s):     --- Professional ---                        Z86.010, Personal history of colonic polyps                        D12.3, Benign neoplasm of transverse colon (hepatic                         flexure or splenic flexure) CPT copyright 2022 American Medical Association. All rights reserved. The codes documented in this report are preliminary and upon coder review may  be revised to meet current compliance requirements. Rogelia Copping MD, MD 10/29/2023 11:17:26 AM This report has been signed electronically. Number of Addenda: 0 Note Initiated On: 10/29/2023 10:54 AM Scope Withdrawal Time: 0 hours 6 minutes 26 seconds  Total Procedure Duration: 0 hours 13 minutes 35 seconds  Estimated Blood Loss:  Estimated blood loss: none.      [facility]

## 2023-10-29 NOTE — H&P (Signed)
 Rogelia Copping, MD [facility] [address], [address] [place], [place] [address] [phone] Fax : [phone]  Primary Care Physician:  Valerio Melanie DASEN, NP Primary Gastroenterologist:  Dr. Copping  Pre-Procedure History & Physical: HPI:  Lauren Nolan is a 78 y.o. female is here for an colonoscopy.   Past Medical History:  Diagnosis Date   Allergy    Anxiety    COPD (chronic obstructive pulmonary disease) (HCC)    Depression    GERD (gastroesophageal reflux disease)    Hypertension    Internal hemorrhoid    Lichen sclerosus    Osteoporosis    Spinal stenosis    Tobacco use     Past Surgical History:  Procedure Laterality Date   BREAST EXCISIONAL BIOPSY Bilateral    1980's - benign   BREAST SURGERY Bilateral    biopsy   COLONOSCOPY WITH PROPOFOL  N/A 10/03/2019   Procedure: COLONOSCOPY WITH PROPOFOL ;  Surgeon: Therisa Bi, MD;  Location: [facility] ENDOSCOPY;  Service: Gastroenterology;  Laterality: N/A;   EYE SURGERY Bilateral Feb-Mar 2016   HERNIA REPAIR     TUBAL LIGATION      Prior to Admission medications   Medication Sig Start Date End Date Taking? Authorizing Provider  albuterol  (VENTOLIN  HFA) 108 (90 Base) MCG/ACT inhaler Inhale 1-2 puffs into the lungs every 6 (six) hours as needed for wheezing or shortness of breath. 04/28/23  Yes Cannady, Jolene T, NP  aspirin 81 MG tablet Take 81 mg by mouth daily.   Yes [provider]  lidocaine  (LIDODERM ) 5 % Place 1 patch onto the skin daily. Remove & Discard patch within 12 hours or as directed by MD 01/13/23  Yes Cannady, Jolene T, NP  olmesartan  (BENICAR ) 20 MG tablet Take 1 tablet (20 mg total) by mouth daily. 04/28/23  Yes Cannady, Jolene T, NP  omeprazole  (PRILOSEC) 20 MG capsule Take 1 capsule (20 mg total) by mouth daily. 04/28/23  Yes Cannady, Jolene T, NP  rosuvastatin  (CRESTOR ) 5 MG tablet Take 1 tablet (5 mg total) by mouth daily. 04/28/23  Yes Cannady, Jolene T, NP  Turmeric (QC TUMERIC COMPLEX PO) Take by  mouth.   Yes [provider]  umeclidinium-vilanterol (ANORO ELLIPTA ) 62.5-25 MCG/ACT AEPB Inhale 1 puff into the lungs daily at 6 (six) AM. 04/28/23  Yes Cannady, Jolene T, NP  venlafaxine  XR (EFFEXOR -XR) 75 MG 24 hr capsule Take 1 capsule (75 mg total) by mouth daily with breakfast. 04/28/23  Yes Cannady, Jolene T, NP  VITAMIN D , CHOLECALCIFEROL, PO Take by mouth.   Yes [provider]  alendronate  (FOSAMAX ) 70 MG tablet Take 1 tablet (70 mg total) by mouth once a week. Take with a full glass of water on an empty stomach. 04/28/23   Cannady, Jolene T, NP  busPIRone  (BUSPAR ) 5 MG tablet Take 2 tablets (10 mg total) by mouth 2 (two) times daily. Patient not taking: Reported on 09/03/2023 08/01/22   Cannady, Jolene T, NP    Allergies as of 09/03/2023 - Review Complete 09/03/2023  Allergen Reaction Noted   Elemental sulfur  02/27/2015   Midol [acetaminophen ] Hypertension 06/19/2017   Sulfa antibiotics  10/16/2021    Family History  Problem Relation Age of Onset   Dementia Mother    Anxiety disorder Mother    Hypertension Mother    Epilepsy Mother    Cancer Father        lung   Diabetes Brother    Colon cancer Brother    Hypertension Daughter  Cancer Daughter    Heart disease Maternal Grandfather        MI   Hypertension Brother    Diabetes Daughter    Arthritis Daughter    Breast cancer Neg Hx     Social History   Socioeconomic History   Marital status: Married    Spouse name: Sports administrator   Number of children: 4   Years of education: Not on file   Highest education level: 8th grade  Occupational History   Occupation: retired  Tobacco Use   Smoking status: Every Day    Current packs/day: 0.50    Average packs/day: 0.5 packs/day for 57.5 years (28.8 ttl pk-yrs)    Types: Cigarettes    Start date: 1968    Passive exposure: Current   Smokeless tobacco: Never   Tobacco comments:    1 pack every 3 days 09/03/23 smokes 1/2 ppd  Vaping Use   Vaping status:  Every Day   Substances: Nicotine, Flavoring  Substance and Sexual Activity   Alcohol use: Yes    Alcohol/week: 7.0 standard drinks of alcohol    Types: 7 Glasses of wine per week    Comment: 1 glass of wine daily at bedtime   Drug use: No   Sexual activity: Yes  Other Topics Concern   Not on file  Social History Narrative   Retired, has daughter living at home with her and her husband    09/03/23 4 children, 3 living and 1 deceased and 4 step-children   Social Drivers of Corporate investment banker Strain: Low Risk  (09/03/2023)   Overall Financial Resource Strain (CARDIA)    Difficulty of Paying Living Expenses: Not hard at all  Food Insecurity: No Food Insecurity (09/03/2023)   Hunger Vital Sign    Worried About Running Out of Food in the Last Year: Never true    Ran Out of Food in the Last Year: Never true  Transportation Needs: No Transportation Needs (09/03/2023)   PRAPARE - Administrator, Civil Service (Medical): No    Lack of Transportation (Non-Medical): No  Physical Activity: Inactive (09/03/2023)   Exercise Vital Sign    Days of Exercise per Week: 0 days    Minutes of Exercise per Session: 0 min  Stress: Stress Concern Present (09/03/2023)   [employer] of Occupational Health - Occupational Stress Questionnaire    Feeling of Stress : Rather much  Social Connections: Moderately Integrated (09/03/2023)   Social Connection and Isolation Panel    Frequency of Communication with Friends and Family: More than three times a week    Frequency of Social Gatherings with Friends and Family: More than three times a week    Attends Religious Services: More than 4 times per year    Active Member of [employer] or Organizations: No    Attends Banker Meetings: Never    Marital Status: Married  Catering manager Violence: Not At Risk (09/03/2023)   Humiliation, Afraid, Rape, and Kick questionnaire    Fear of Current or Ex-Partner: No    Emotionally Abused: No     Physically Abused: No    Sexually Abused: No    Review of Systems: See HPI, otherwise negative ROS  Physical Exam: BP 126/75   Pulse 92   Temp (!) 97 F (36.1 C) (Temporal)   Resp 17   Ht 5' 1 (1.549 m)   Wt 43.5 kg   SpO2 93%   BMI 18.10 kg/m  General:  Alert,  pleasant and cooperative in NAD Head:  Normocephalic and atraumatic. Neck:  Supple; no masses or thyromegaly. Lungs:  Clear throughout to auscultation.    Heart:  Regular rate and rhythm. Abdomen:  Soft, nontender and nondistended. Normal bowel sounds, without guarding, and without rebound.   Neurologic:  Alert and  oriented x4;  grossly normal neurologically.  Impression/Plan: Lauren Nolan is here for an colonoscopy to be performed for a history of adenomatous polyps on 2021   Risks, benefits, limitations, and alternatives regarding  colonoscopy have been reviewed with the patient.  Questions have been answered.  All parties agreeable.   Rogelia Copping, MD  10/29/2023, 10:51 AM

## 2023-10-29 NOTE — Transfer of Care (Signed)
 Immediate Anesthesia Transfer of Care Note  Patient: Lauren Nolan  Procedure(s) Performed: COLONOSCOPY POLYPECTOMY, INTESTINE  Patient Location: PACU and Endoscopy Unit  Anesthesia Type:General  Level of Consciousness: drowsy and patient cooperative  Airway & Oxygen Therapy: Patient Spontanous Breathing  Post-op Assessment: Report given to RN and Post -op Vital signs reviewed and stable  Post vital signs: Reviewed and stable  Last Vitals:  Vitals Value Taken Time  BP 90/56 10/29/23 11:20  Temp    Pulse    Resp 14 10/29/23 11:20  SpO2    Vitals shown include unfiled device data.  Last Pain:  Vitals:   10/29/23 1031  TempSrc: Temporal  PainSc: 0-No pain         Complications: No notable events documented.

## 2023-10-29 NOTE — Anesthesia Postprocedure Evaluation (Signed)
 Anesthesia Post Note  Patient: Lauren Nolan  Procedure(s) Performed: COLONOSCOPY POLYPECTOMY, INTESTINE  Patient location during evaluation: Endoscopy Anesthesia Type: General Level of consciousness: awake and alert Pain management: pain level controlled Vital Signs Assessment: post-procedure vital signs reviewed and stable Respiratory status: spontaneous breathing, nonlabored ventilation, respiratory function stable and patient connected to nasal cannula oxygen Cardiovascular status: blood pressure returned to baseline and stable Postop Assessment: no apparent nausea or vomiting Anesthetic complications: no   No notable events documented.   Last Vitals:  Vitals:   10/29/23 1140 10/29/23 1150  BP: 108/63 (!) 117/59  Pulse: 61 100  Resp: (!) 24 13  Temp:    SpO2:  94%    Last Pain:  Vitals:   10/29/23 1150  TempSrc:   PainSc: 0-No pain                 Debby Mines

## 2023-10-29 NOTE — Anesthesia Procedure Notes (Signed)
 Procedure Name: MAC Date/Time: 10/29/2023 10:58 AM  Performed by: Lacretia Camelia NOVAK, CRNAPre-anesthesia Checklist: Patient identified, Emergency Drugs available, Suction available and Patient being monitored Patient Re-evaluated:Patient Re-evaluated prior to induction Oxygen Delivery Method: Nasal cannula

## 2023-10-30 ENCOUNTER — Encounter: Payer: Self-pay | Admitting: Gastroenterology

## 2023-10-30 ENCOUNTER — Ambulatory Visit: Payer: Self-pay | Admitting: Gastroenterology

## 2023-10-30 LAB — SURGICAL PATHOLOGY

## 2024-01-28 ENCOUNTER — Ambulatory Visit: Payer: Self-pay

## 2024-01-28 NOTE — Telephone Encounter (Signed)
 FYI Only or Action Required?: FYI only for provider.  Patient was last seen in primary care on 05/19/2023 by Cannady, Jolene T, NP.  Called Nurse Triage reporting Fall.  Symptoms began several months ago.  Interventions attempted: Rest, hydration, or home remedies.  Symptoms are: gradually worsening.  Triage Disposition: See PCP Within 2 Weeks  Patient/caregiver understands and will follow disposition?: Yes  Copied from CRM [id number]. Topic: Clinical - Red Word Triage >> Jan 28, 2024  4:37 PM Tobias L wrote: Red Word that prompted transfer to Nurse Triage: balance is of - patient has fallen twice, more anxiety than normal, pain in side Reason for Disposition  [1] Fall AND [2] patient seems very anxious and fearful of falling again  Answer Assessment - Initial Assessment Questions Patient for the last year has been dealing with balance issues. She has had 3 falls in the last year with the first one causing her to break her wrist. Her most recent fall was about 3 weeks ago. States she will go to turn around and then fall over. She scraped up her left side and her right leg this last time with wounds still healing. Denies hitting head. States it gets worse with anxiety. Denies unilateral weakness, vision changes, numbness and tingling. She is having a lot of caregiver strain due to caring for her husband. Advised self care practice and making sure she takes breaks and has help. Advised to research support groups or discuss getting therapist with provider.  Appt found for tomorrow with Jolene Cannady to discuss balance and increase in anxiety. ED/UC/Callback instructions given and understood.   1. MECHANISM: How did the fall happen?     Turning sideways and falls 3. ONSET: When did the fall happen? (e.g., minutes, hours, or days ago)     About a year ago  4. LOCATION: What part of the body hit the ground? (e.g., back, buttocks, head, hips, knees, hands, head, stomach)     Arm and  legs 5. INJURY: Did you hurt (injure) yourself when you fell? If Yes, ask: What did you injure? Tell me more about this? (e.g., body area; type of injury; pain severity)     Left arm, left leg and right leg had road rash from falling, a year ago broke her wrist 6. PAIN: Is there any pain? If Yes, ask: How bad is the pain? (e.g., Scale 0-10; or none, mild,      No pain related to the fall.  7. SIZE: For cuts, bruises, or swelling, ask: How large is it? (e.g., inches or centimeters)      Scrapes Down arm and legs 9. OTHER SYMPTOMS: Do you have any other symptoms? (e.g., dizziness, fever, weakness; new-onset or worsening).      Denies  10. CAUSE: What do you think caused the fall (or falling)? (e.g., dizzy spell, tripped)       Stress has been worsening  Protocols used: Falls and [facility]

## 2024-01-29 ENCOUNTER — Ambulatory Visit (INDEPENDENT_AMBULATORY_CARE_PROVIDER_SITE_OTHER): Admitting: Nurse Practitioner

## 2024-01-29 ENCOUNTER — Encounter: Payer: Self-pay | Admitting: Nurse Practitioner

## 2024-01-29 VITALS — BP 117/75 | HR 90 | Temp 98.7°F | Ht 62.5 in | Wt 99.0 lb

## 2024-01-29 DIAGNOSIS — Z23 Encounter for immunization: Secondary | ICD-10-CM | POA: Diagnosis not present

## 2024-01-29 DIAGNOSIS — Z9181 History of falling: Secondary | ICD-10-CM | POA: Diagnosis not present

## 2024-01-29 DIAGNOSIS — F329 Major depressive disorder, single episode, unspecified: Secondary | ICD-10-CM | POA: Diagnosis not present

## 2024-01-29 DIAGNOSIS — F411 Generalized anxiety disorder: Secondary | ICD-10-CM

## 2024-01-29 MED ORDER — VENLAFAXINE HCL ER 37.5 MG PO CP24: 37.5000 mg | ORAL_CAPSULE | Freq: Every day | ORAL | 1 refills | Status: AC

## 2024-01-29 MED ORDER — VENLAFAXINE HCL ER 75 MG PO CP24: 75.0000 mg | ORAL_CAPSULE | Freq: Every day | ORAL | 4 refills | Status: AC

## 2024-01-29 NOTE — Assessment & Plan Note (Addendum)
 Chronic, stable.  Denies SI/HI.  Refer to depression plan of care.

## 2024-01-29 NOTE — Assessment & Plan Note (Signed)
 Chronic, stable.  Denies SI/HI.  Will increase Effexor  for a period to 112.5 MG (a 75 MG and a 37.5 MG capsule daily) due to increase in anxiety since her January fall.  Educated her on this change. Stop Buspar  as this made her tired. Highly recommend she have in home PT to help with her confidence in mobility and help reduce fall risk. She refuses at this time, but will think about it.

## 2024-01-29 NOTE — Patient Instructions (Signed)

## 2024-01-29 NOTE — Progress Notes (Signed)
 BP 117/75   Pulse 90   Temp 98.7 F (37.1 C) (Oral)   Ht 5' 2.5 (1.588 m)   Wt 99 lb (44.9 kg)   SpO2 92%   BMI 17.82 kg/m    Subjective:    Patient ID: Lauren Nolan, female    DOB: 1946/04/08, 78 y.o.   MRN: [id number]  HPI: Lauren Nolan is a 78 y.o. female  Chief Complaint  Patient presents with   Fall    Patient states she has been having balance issues and couple of falls over the last year. States she is becoming more worried about falling.    Anxiety    Patient states she has been having an increase in her anxiety and that this is contributing to her balance and falling. States she has been taking the Venlafaxine  as prescribed but does not take the Buspirone .    ANXIETY/STRESS & FALLS Taking Effexor  XR 75 MG daily. Has not taken Buspar  recently, does recall it made her sleepy in past. Has been more anxious recently due to falls.  Has history of fall at beginning of the year, January, and broke left distal radius. Prior to this fall she had not been falling, during that fall she stepped backwards and lost balance.  Since that fall in January she has had two more falls. Her last fall was less than one month ago.  Went over to a friend's camper next door to look at a desk.  She was walking up the steps and was already tired. Started to turn around, lost balance, and fell. Gets an unsure feeling.  Does not get dizzy.  Denies any neuropathy or pain. Since recent fall she has had some discomfort to left side lower ribs.  Did not hit ribs, but did twist in a different way and feels she pulled muscle. She does endorse since the January fall she has been very anxious about falling again and lost some confidence. Duration:exacerbated Anxious mood: yes  Excessive worrying: yes Irritability: no  Sweating: no Nausea: no Palpitations:no Hyperventilation: no Panic attacks: yes was really worried about her husband at the time Agoraphobia: no  Obscessions/compulsions: no Depressed  mood: no    01/29/2024    2:58 PM 09/03/2023    9:37 AM 05/19/2023   10:26 AM 04/28/2023    2:58 PM 02/05/2023   10:15 AM  Depression screen PHQ 2/9  Decreased Interest 0 0 0 0 0  Down, Depressed, Hopeless 0 0 0 0 0  PHQ - 2 Score 0 0 0 0 0  Altered sleeping 1 0 0 0 0  Tired, decreased energy 1 2 0 0 0  Change in appetite 0 0 0 0 0  Feeling bad or failure about yourself  0 0 0 0 0  Trouble concentrating 0 0 0 0 0  Moving slowly or fidgety/restless 0 0 0 0 0  Suicidal thoughts 0 0 0 0 0  PHQ-9 Score 2 2 0 0 0  Difficult doing work/chores Somewhat difficult Not difficult at all Not difficult at all Not difficult at all Not difficult at all  Anhedonia: no Weight changes: no Insomnia: none Hypersomnia: no Fatigue/loss of energy: no Feelings of worthlessness: no Feelings of guilt: no Impaired concentration/indecisiveness: no Suicidal ideations: no  Crying spells: yes Recent Stressors/Life Changes: yes   Relationship problems: yes   Family stress: no     Financial stress: no    Job stress: no    Recent death/loss: no  01/29/2024    2:58 PM 05/19/2023   10:26 AM 04/28/2023    2:59 PM 02/05/2023   10:15 AM  GAD 7 : Generalized Anxiety Score  Nervous, Anxious, on Edge 3 1 1 2   Control/stop worrying 0 0 0 1  Worry too much - different things 1 0 0 0  Trouble relaxing 0 0 0 0  Restless 0 0 0 0  Easily annoyed or irritable 0 0 0 0  Afraid - awful might happen 1 0 0 0  Total GAD 7 Score 5 1 1 3   Anxiety Difficulty Not difficult at all Not difficult at all Not difficult at all      Relevant past medical, surgical, family and social history reviewed and updated as indicated. Interim medical history since our last visit reviewed. Allergies and medications reviewed and updated.  Review of Systems  Constitutional:  Negative for activity change, appetite change, diaphoresis, fatigue and fever.  Respiratory:  Negative for cough, chest tightness, shortness of breath and wheezing.    Cardiovascular:  Negative for chest pain, palpitations and leg swelling.  Gastrointestinal: Negative.   Neurological:  Negative for dizziness, syncope, weakness, light-headedness, numbness and headaches.  Psychiatric/Behavioral:  Negative for decreased concentration, self-injury, sleep disturbance and suicidal ideas. The patient is nervous/anxious.     Per HPI unless specifically indicated above     Objective:    BP 117/75   Pulse 90   Temp 98.7 F (37.1 C) (Oral)   Ht 5' 2.5 (1.588 m)   Wt 99 lb (44.9 kg)   SpO2 92%   BMI 17.82 kg/m   Wt Readings from Last 3 Encounters:  01/29/24 99 lb (44.9 kg)  10/29/23 95 lb 12.8 oz (43.5 kg)  09/03/23 98 lb (44.5 kg)    Physical Exam Vitals and nursing note reviewed.  Constitutional:      General: She is awake. She is not in acute distress.    Appearance: She is well-developed, well-groomed and underweight. She is not ill-appearing or toxic-appearing.  HENT:     Head: Normocephalic.     Right Ear: Hearing and external ear normal.     Left Ear: Hearing and external ear normal.  Eyes:     General: Lids are normal.        Right eye: No discharge.        Left eye: No discharge.     Extraocular Movements: Extraocular movements intact.     Conjunctiva/sclera: Conjunctivae normal.     Pupils: Pupils are equal, round, and reactive to light.     Visual Fields: Right eye visual fields normal and left eye visual fields normal.  Neck:     Thyroid : No thyromegaly.     Vascular: No carotid bruit.  Cardiovascular:     Rate and Rhythm: Normal rate and regular rhythm.     Heart sounds: Normal heart sounds. No murmur heard.    No gallop.  Pulmonary:     Effort: Pulmonary effort is normal. No accessory muscle usage or respiratory distress.     Breath sounds: Normal breath sounds. No decreased breath sounds, wheezing or rales.  Abdominal:     General: Bowel sounds are normal. There is no distension.     Palpations: Abdomen is soft.      Tenderness: There is no abdominal tenderness.  Musculoskeletal:     Cervical back: Normal range of motion and neck supple.     Right lower leg: No edema.     Left lower  leg: No edema.  Lymphadenopathy:     Cervical: No cervical adenopathy.  Skin:    General: Skin is warm and dry.  Neurological:     Mental Status: She is alert and oriented to person, place, and time.     Cranial Nerves: Cranial nerves 2-12 are intact.     Motor: Motor function is intact.     Coordination: Coordination is intact. Romberg sign negative. Finger-Nose-Finger Test normal.     Gait: Gait is intact.     Deep Tendon Reflexes: Reflexes are normal and symmetric.     Reflex Scores:      Brachioradialis reflexes are 2+ on the right side and 2+ on the left side.      Patellar reflexes are 2+ on the right side and 2+ on the left side.    Comments: Strength BUE 5/5 and BLE 5/5. No antalgic gait. Able to get up onto table without assist.   Psychiatric:        Attention and Perception: Attention normal.        Mood and Affect: Mood is anxious.        Speech: Speech normal.        Behavior: Behavior normal. Behavior is cooperative.        Thought Content: Thought content normal.     Results for orders placed or performed during the hospital encounter of 10/29/23  Surgical pathology   Collection Time: 10/29/23 12:00 AM  Result Value Ref Range   SURGICAL PATHOLOGY      SURGICAL PATHOLOGY [facility] [address] [place], [place] [address] Telephone [phone] or [phone] Fax [phone]  REPORT OF SURGICAL PATHOLOGY   Accession #: [id number] Patient Name: Lauren Nolan, Lauren Nolan Visit # : [id number]  MRN: [id number] Physician: Jinny Carmine DOB/Age [birth year] (Age: 39) Gender: F Collected Date: 10/29/2023 Received Date: 10/29/2023  FINAL DIAGNOSIS       1. Ascending  Colon Polyp, cold snare x2 :       FRAGMENTS OF TUBULAR ADENOMA.      NEGATIVE FOR HIGH-GRADE  DYSPLASIA.       2. Transverse Colon Polyp, cold snare :       TUBULAR ADENOMA.      NEGATIVE FOR HIGH-GRADE DYSPLASIA.       ELECTRONIC SIGNATURE : Pepper Dutton Md, Pathologist, Electronic Signature  MICROSCOPIC DESCRIPTION  CASE COMMENTS STAINS USED IN DIAGNOSIS: H&E H&E    CLINICAL HISTORY  SPECIMEN(S) OBTAINED 1. Ascending  Colon Polyp, Cold Snare X2 2. Transverse Colon Polyp, Cold Snare  SPECIMEN COMMENTS: SPECIMEN  CLINICAL INFORMATION: 1. History of colon polyps.Colon polyps, hemorrhoids, diverticulosis    Gross Description 1. Cold snare ascending colon polyp x2, received in formalin is a 1.0 x 0.8 x 0.1 cm aggregate of multiple gray-tan tissue fragments admixed with a mild amount of food like material.The specimen is filtered and the tissue is submitted in toto in 1 block (1A). 2. Cold snare transverse colon polyp, received in formalin with a mild amount of food like material is a single 0.5 x 0.3 x 0.1 cm white-gray tissue fragment.The tissue is submitted in toto in 1 block (2A).      [facility] 10/29/2023        Report signed out from the following location(s) [place]. [facility] [address] [place], [place] [address] CLIA #: [id number]  [facility] [address] [place], [place] [address] CLIA #: [id number]  Assessment & Plan:   Problem List Items Addressed This Visit       Other   Generalized anxiety disorder   Chronic, stable.  Denies SI/HI.  Refer to depression plan of care.      Relevant Medications   venlafaxine  XR (EFFEXOR -XR) 37.5 MG 24 hr capsule   venlafaxine  XR (EFFEXOR -XR) 75 MG 24 hr capsule   Depression - Primary   Chronic, stable.  Denies SI/HI.  Will increase Effexor  for a period to 112.5 MG (a 75 MG and a 37.5 MG capsule daily) due to increase in anxiety since her January fall.  Educated her on this change. Stop Buspar  as this made her tired. Highly recommend she have in home PT to help  with her confidence in mobility and help reduce fall risk. She refuses at this time, but will think about it.      Relevant Medications   venlafaxine  XR (EFFEXOR -XR) 37.5 MG 24 hr capsule   venlafaxine  XR (EFFEXOR -XR) 75 MG 24 hr capsule   Other Relevant Orders   Basic metabolic panel with GFR   CBC with Differential/Platelet   TSH   Vitamin B12   At high risk for falls   Since fall January 2025, she does endorse loss of confidence and more fear of falling.  More anxiety.  Has had two falls since then, but no injury other than some skin tears and a pulled muscle. Highly recommend she have in home PT to help with her confidence in mobility and help reduce fall risk. She refuses at this time, but will think about it. Overall neuro exam reassuring.      Other Visit Diagnoses       Flu vaccine need       Flu vaccine today, educated patient.   Relevant Orders   Flu vaccine HIGH DOSE PF(Fluzone Trivalent)        Follow up plan: Return in about 6 weeks (around 03/11/2024) for ANXIETY and FALLS.

## 2024-01-29 NOTE — Assessment & Plan Note (Addendum)
 Since fall January 2025, she does endorse loss of confidence and more fear of falling.  More anxiety.  Has had two falls since then, but no injury other than some skin tears and a pulled muscle. Highly recommend she have in home PT to help with her confidence in mobility and help reduce fall risk. She refuses at this time, but will think about it. Overall neuro exam reassuring.

## 2024-01-30 LAB — CBC WITH DIFFERENTIAL/PLATELET
Basophils Absolute: 0.2 x10E3/uL (ref 0.0–0.2)
Basos: 2 %
EOS (ABSOLUTE): 0.3 x10E3/uL (ref 0.0–0.4)
Eos: 3 %
Hematocrit: 41.7 % (ref 34.0–46.6)
Hemoglobin: 13.9 g/dL (ref 11.1–15.9)
Immature Grans (Abs): 0 x10E3/uL (ref 0.0–0.1)
Immature Granulocytes: 0 %
Lymphocytes Absolute: 2.6 x10E3/uL (ref 0.7–3.1)
Lymphs: 29 %
MCH: 33.5 pg — ABNORMAL HIGH (ref 26.6–33.0)
MCHC: 33.3 g/dL (ref 31.5–35.7)
MCV: 101 fL — ABNORMAL HIGH (ref 79–97)
Monocytes Absolute: 0.9 x10E3/uL (ref 0.1–0.9)
Monocytes: 10 %
Neutrophils Absolute: 5.1 x10E3/uL (ref 1.4–7.0)
Neutrophils: 56 %
Platelets: 265 x10E3/uL (ref 150–450)
RBC: 4.15 x10E6/uL (ref 3.77–5.28)
RDW: 11.8 % (ref 11.7–15.4)
WBC: 9 x10E3/uL (ref 3.4–10.8)

## 2024-01-30 LAB — BASIC METABOLIC PANEL WITH GFR
BUN/Creatinine Ratio: 21 (ref 12–28)
BUN: 16 mg/dL (ref 8–27)
CO2: 24 mmol/L (ref 20–29)
Calcium: 9.4 mg/dL (ref 8.7–10.3)
Chloride: 97 mmol/L (ref 96–106)
Creatinine, Ser: 0.77 mg/dL (ref 0.57–1.00)
Glucose: 77 mg/dL (ref 70–99)
Potassium: 4.2 mmol/L (ref 3.5–5.2)
Sodium: 136 mmol/L (ref 134–144)
eGFR: 79 mL/min/1.73 (ref >59)

## 2024-01-30 LAB — TSH: TSH: 2.38 u[IU]/mL (ref 0.450–4.500)

## 2024-01-30 LAB — VITAMIN B12: Vitamin B-12: 477 pg/mL (ref 232–1245)

## 2024-01-31 ENCOUNTER — Ambulatory Visit: Payer: Self-pay | Admitting: Nurse Practitioner

## 2024-01-31 NOTE — Progress Notes (Signed)
 Please let [name] know: Good morning Lauren Nolan, your labs have returned: - CBC is overall stable with no anemia or infection. - Kidney function is stable. - Thyroid  level is normal. - B12 level is on lower side of normal, you may benefit from starting over the counter Vitamin B12 1000 MCG daily for nervous system health and fall prevention.  Any questions? Keep being amazing!!  Thank you for allowing me to participate in your care.  I appreciate you. Kindest regards, Melville Engen'

## 2024-03-12 NOTE — Patient Instructions (Incomplete)
 Be Involved in Caring For Your Health:  Taking Medications When medications are taken as directed, they can greatly improve your health. But if they are not taken as prescribed, they may not work. In some cases, not taking them correctly can be harmful. To help ensure your treatment remains effective and safe, understand your medications and how to take them. Bring your medications to each visit for review by your provider.  Your lab results, notes, and after visit summary will be available on My Chart. We strongly encourage you to use this feature. If lab results are abnormal the clinic will contact you with the appropriate steps. If the clinic does not contact you assume the results are satisfactory. You can always view your results on My Chart. If you have questions regarding your health or results, please contact the clinic during office hours. You can also ask questions on My Chart.  We at [facility] are grateful that you chose us  to provide your care. We strive to provide evidence-based and compassionate care and are always looking for feedback. If you get a survey from the clinic please complete this so we can hear your opinions.  Managing Depression, Adult Depression is a mental health condition that affects your thoughts, feelings, and actions. Being diagnosed with depression can bring you relief if you did not know why you have felt or behaved a certain way. It could also leave you feeling overwhelmed. Finding ways to manage your symptoms can help you feel more positive about your future. How to manage lifestyle changes Being depressed is difficult. Depression can increase the level of everyday stress. Stress can make depression symptoms worse. You may believe your symptoms cannot be managed or will never improve. However, there are many things you can try to help manage your symptoms. There is hope. Managing stress  Stress is your body's reaction to life changes and events,  both good and bad. Stress can add to your feelings of depression. Learning to manage your stress can help lessen your feelings of depression. Try some of the following approaches to reducing your stress (stress reduction techniques): Listen to music that you enjoy and that inspires you. Try using a meditation app or take a meditation class. Develop a practice that helps you connect with your spiritual self. Walk in nature, pray, or go to a place of worship. Practice deep breathing. To do this, inhale slowly through your nose. Pause at the top of your inhale for a few seconds and then exhale slowly, letting yourself relax. Repeat this three or four times. Practice yoga to help relax and work your muscles. Choose a stress reduction technique that works for you. These techniques take time and practice to develop. Set aside 5-15 minutes a day to do them. Therapists can offer training in these techniques. Do these things to help manage stress: Keep a journal. Know your limits. Set healthy boundaries for yourself and others, such as saying no when you think something is too much. Pay attention to how you react to certain situations. You may not be able to control everything, but you can change your reaction. Add humor to your life by watching funny movies or shows. Make time for activities that you enjoy and that relax you. Spend less time using electronics, especially at night before bed. The light from screens can make your brain think it is time to get up rather than go to bed.  Medicines Medicines, such as antidepressants, are often a part of  treatment for depression. Talk with your pharmacist or health care provider about all the medicines, supplements, and herbal products that you take, their possible side effects, and what medicines and other products are safe to take together. Make sure to report any side effects you may have to your health care provider. Relationships Your health care  provider may suggest family therapy, couples therapy, or individual therapy as part of your treatment. How to recognize changes Everyone responds differently to treatment for depression. As you recover from depression, you may start to: Have more interest in doing activities. Feel more hopeful. Have more energy. Eat a more regular amount of food. Have better mental focus. It is important to recognize if your depression is not getting better or is getting worse. The symptoms you had in the beginning may return, such as: Feeling tired. Eating too much or too little. Sleeping too much or too little. Feeling restless, agitated, or hopeless. Trouble focusing or making decisions. Having unexplained aches and pains. Feeling irritable, angry, or aggressive. If you or your family members notice these symptoms coming back, let your health care provider know right away. Follow these instructions at home: Activity Try to get some form of exercise each day, such as walking. Try yoga, mindfulness, or other stress reduction techniques. Participate in group activities if you are able. Lifestyle Get enough sleep. Cut down on or stop using caffeine, tobacco, alcohol, and any other harmful substances. Eat a healthy diet that includes plenty of vegetables, fruits, whole grains, low-fat dairy products, and lean protein. Limit foods that are high in solid fats, added sugar, or salt (sodium). General instructions Take over-the-counter and prescription medicines only as told by your health care provider. Keep all follow-up visits. It is important for your health care provider to check on your mood, behavior, and medicines. Your health care provider may need to make changes to your treatment. Where to find support Talking to others  Friends and family members can be sources of support and guidance. Talk to trusted friends or family members about your condition. Explain your symptoms and let them know that you  are working with a health care provider to treat your depression. Tell friends and family how they can help. Finances Find mental health providers that fit with your financial situation. Talk with your health care provider if you are worried about access to food, housing, or medicine. Call your insurance company to learn about your co-pays and prescription plan. Where to find more information You can find support in your area from: Anxiety and Depression Association of America (ADAA): adaa.org Mental Health America: mentalhealthamerica.net The [employer] on Mental Illness: nami.org Contact a health care provider if: You stop taking your antidepressant medicines, and you have any of these symptoms: Nausea. Headache. Light-headedness. Chills and body aches. Not being able to sleep (insomnia). You or your friends and family think your depression is getting worse. Get help right away if: You have thoughts of hurting yourself or others. Get help right away if you feel like you may hurt yourself or others, or have thoughts about taking your own life. Go to your nearest emergency room or: Call 911. Call the National Suicide Prevention Lifeline at (743) 630-7432 or 988. This is open 24 hours a day. Text the Crisis Text Line at 854-590-3828. This information is not intended to replace advice given to you by your health care provider. Make sure you discuss any questions you have with your health care provider. Document Revised: 08/06/2021 Document Reviewed:  08/06/2021 Elsevier Patient Education  2024 [employer].

## 2024-03-22 ENCOUNTER — Ambulatory Visit: Admitting: Nurse Practitioner

## 2024-03-22 DIAGNOSIS — E871 Hypo-osmolality and hyponatremia: Secondary | ICD-10-CM

## 2024-03-22 DIAGNOSIS — F329 Major depressive disorder, single episode, unspecified: Secondary | ICD-10-CM

## 2024-03-22 DIAGNOSIS — F411 Generalized anxiety disorder: Secondary | ICD-10-CM

## 2024-03-23 ENCOUNTER — Ambulatory Visit: Admitting: Nurse Practitioner

## 2024-03-23 ENCOUNTER — Telehealth: Payer: Self-pay | Admitting: Nurse Practitioner

## 2024-03-23 NOTE — Patient Instructions (Incomplete)
 Managing Depression, Adult Depression is a mental health condition that affects your thoughts, feelings, and actions. Being diagnosed with depression can bring you relief if you did not know why you have felt or behaved a certain way. It could also leave you feeling overwhelmed. Finding ways to manage your symptoms can help you feel more positive about your future. How to manage lifestyle changes Being depressed is difficult. Depression can increase the level of everyday stress. Stress can make depression symptoms worse. You may believe your symptoms cannot be managed or will never improve. However, there are many things you can try to help manage your symptoms. There is hope. Managing stress  Stress is your body's reaction to life changes and events, both good and bad. Stress can add to your feelings of depression. Learning to manage your stress can help lessen your feelings of depression. Try some of the following approaches to reducing your stress (stress reduction techniques): Listen to music that you enjoy and that inspires you. Try using a meditation app or take a meditation class. Develop a practice that helps you connect with your spiritual self. Walk in nature, pray, or go to a place of worship. Practice deep breathing. To do this, inhale slowly through your nose. Pause at the top of your inhale for a few seconds and then exhale slowly, letting yourself relax. Repeat this three or four times. Practice yoga to help relax and work your muscles. Choose a stress reduction technique that works for you. These techniques take time and practice to develop. Set aside 5-15 minutes a day to do them. Therapists can offer training in these techniques. Do these things to help manage stress: Keep a journal. Know your limits. Set healthy boundaries for yourself and others, such as saying "no" when you think something is too much. Pay attention to how you react to certain situations. You may not be able to  control everything, but you can change your reaction. Add humor to your life by watching funny movies or shows. Make time for activities that you enjoy and that relax you. Spend less time using electronics, especially at night before bed. The light from screens can make your brain think it is time to get up rather than go to bed.  Medicines Medicines, such as antidepressants, are often a part of treatment for depression. Talk with your pharmacist or health care provider about all the medicines, supplements, and herbal products that you take, their possible side effects, and what medicines and other products are safe to take together. Make sure to report any side effects you may have to your health care provider. Relationships Your health care provider may suggest family therapy, couples therapy, or individual therapy as part of your treatment. How to recognize changes Everyone responds differently to treatment for depression. As you recover from depression, you may start to: Have more interest in doing activities. Feel more hopeful. Have more energy. Eat a more regular amount of food. Have better mental focus. It is important to recognize if your depression is not getting better or is getting worse. The symptoms you had in the beginning may return, such as: Feeling tired. Eating too much or too little. Sleeping too much or too little. Feeling restless, agitated, or hopeless. Trouble focusing or making decisions. Having unexplained aches and pains. Feeling irritable, angry, or aggressive. If you or your family members notice these symptoms coming back, let your health care provider know right away. Follow these instructions at home: Activity Try to  get some form of exercise each day, such as walking. Try yoga, mindfulness, or other stress reduction techniques. Participate in group activities if you are able. Lifestyle Get enough sleep. Cut down on or stop using caffeine, tobacco,  alcohol, and any other harmful substances. Eat a healthy diet that includes plenty of vegetables, fruits, whole grains, low-fat dairy products, and lean protein. Limit foods that are high in solid fats, added sugar, or salt (sodium). General instructions Take over-the-counter and prescription medicines only as told by your health care provider. Keep all follow-up visits. It is important for your health care provider to check on your mood, behavior, and medicines. Your health care provider may need to make changes to your treatment. Where to find support Talking to others  Friends and family members can be sources of support and guidance. Talk to trusted friends or family members about your condition. Explain your symptoms and let them know that you are working with a health care provider to treat your depression. Tell friends and family how they can help. Finances Find mental health providers that fit with your financial situation. Talk with your health care provider if you are worried about access to food, housing, or medicine. Call your insurance company to learn about your co-pays and prescription plan. Where to find more information You can find support in your area from: Anxiety and Depression Association of America (ADAA): adaa.org Mental Health [place]: mentalhealthamerica.net [employer] on Mental Illness: nami.org Contact a health care provider if: You stop taking your antidepressant medicines, and you have any of these symptoms: Nausea. Headache. Light-headedness. Chills and body aches. Not being able to sleep (insomnia). You or your friends and family think your depression is getting worse. Get help right away if: You have thoughts of hurting yourself or others. Get help right away if you feel like you may hurt yourself or others, or have thoughts about taking your own life. Go to your nearest emergency room or: Call 911. Call the National Suicide Prevention Lifeline at  (215) 435-0408 or 988. This is open 24 hours a day. Text the Crisis Text Line at 318 581 7774. This information is not intended to replace advice given to you by your health care provider. Make sure you discuss any questions you have with your health care provider. Document Revised: 08/06/2021 Document Reviewed: 08/06/2021 Elsevier Patient Education  2024 [employer].

## 2024-03-23 NOTE — Telephone Encounter (Signed)
 Called patient to get her scheduled for her missed visit. No answer and could not leave a message.

## 2024-03-23 NOTE — Telephone Encounter (Signed)
-----   Message from [facility] sent at 03/23/2024  3:36 PM EST ----- Please call to reschedule, Lauren Nolan rescheduled her yesterday as was an hour late to visit but today did not show. Want to make sure she is okay as she is not one to often miss visits.

## 2024-04-25 ENCOUNTER — Ambulatory Visit

## 2024-04-25 VITALS — BP 115/73 | HR 94 | Temp 97.9°F | Ht 62.5 in | Wt 100.6 lb

## 2024-04-25 DIAGNOSIS — R051 Acute cough: Secondary | ICD-10-CM

## 2024-04-25 DIAGNOSIS — J410 Simple chronic bronchitis: Secondary | ICD-10-CM

## 2024-04-25 MED ORDER — PREDNISONE 10 MG (21) PO TBPK: ORAL_TABLET | ORAL | 0 refills | Status: AC

## 2024-04-25 MED ORDER — DOXYCYCLINE HYCLATE 100 MG PO TABS: 100.0000 mg | ORAL_TABLET | Freq: Two times a day (BID) | ORAL | 0 refills | Status: AC

## 2024-04-25 MED ORDER — BENZONATATE 100 MG PO CAPS: 100.0000 mg | ORAL_CAPSULE | Freq: Two times a day (BID) | ORAL | 0 refills | Status: AC | PRN

## 2024-04-25 NOTE — Progress Notes (Signed)
 "  BP 115/73   Pulse 94   Temp 97.9 F (36.6 C) (Oral)   Ht 5' 2.5 (1.588 m)   Wt 100 lb 9.6 oz (45.6 kg)   SpO2 94%   BMI 18.11 kg/m    Subjective:    Patient ID: Lauren Nolan, female    DOB: [birth year], 79 y.o.   MRN: [id number]  HPI: Lauren Nolan is a 79 y.o. female reporting 8-9 days of coughing, nasal congestion, with chills.  She is using dayquil that helped with the cough.  She is fatigued.  She has hx of COPD but not using albuterol  for relief.  Chief Complaint  Patient presents with   URI    Patient states she has been having a cough, nasal and chest congestion, sinus pressure, fatigue, and chills for the last 8 to 9 days. States she tried taking dayquil which helped her cough a little bit.     Relevant past medical, surgical, family and social history reviewed and updated as indicated. Interim medical history since our last visit reviewed. Allergies and medications reviewed and updated.  Review of Systems  Constitutional:  Positive for appetite change, chills and fatigue. Negative for fever.  HENT:  Positive for congestion, ear pain, sinus pressure, sinus pain, sneezing and sore throat.   Respiratory:  Positive for cough, choking, chest tightness, shortness of breath and wheezing.   Cardiovascular:  Positive for chest pain. Negative for palpitations.  Gastrointestinal:  Negative for diarrhea, nausea and vomiting.  Genitourinary: Negative.   Musculoskeletal:  Positive for myalgias.  Neurological: Negative.   Psychiatric/Behavioral: Negative.      Per HPI unless specifically indicated above     Objective:    BP 115/73   Pulse 94   Temp 97.9 F (36.6 C) (Oral)   Ht 5' 2.5 (1.588 m)   Wt 100 lb 9.6 oz (45.6 kg)   SpO2 94%   BMI 18.11 kg/m   Wt Readings from Last 3 Encounters:  04/25/24 100 lb 9.6 oz (45.6 kg)  01/29/24 99 lb (44.9 kg)  10/29/23 95 lb 12.8 oz (43.5 kg)    Physical Exam Constitutional:      Appearance: She is ill-appearing.  HENT:      Head: Normocephalic and atraumatic.     Right Ear: Tympanic membrane normal.     Left Ear: Tympanic membrane normal.     Nose: Rhinorrhea present.     Mouth/Throat:     Mouth: Mucous membranes are moist.     Pharynx: Posterior oropharyngeal erythema present. No oropharyngeal exudate.  Cardiovascular:     Rate and Rhythm: Normal rate.  Pulmonary:     Effort: Pulmonary effort is normal. Prolonged expiration present. No respiratory distress.     Breath sounds: Normal breath sounds. No stridor. No wheezing, rhonchi or rales.  Chest:     Chest wall: No tenderness.  Abdominal:     General: Abdomen is flat.     Palpations: Abdomen is soft.  Musculoskeletal:     Cervical back: Normal range of motion. No tenderness.  Lymphadenopathy:     Cervical: No cervical adenopathy.  Neurological:     Mental Status: She is alert.     Results for orders placed or performed in visit on 01/29/24  Basic metabolic panel with GFR   Collection Time: 01/29/24  3:20 PM  Result Value Ref Range   Glucose 77 70 - 99 mg/dL   BUN 16 8 - 27 mg/dL   Creatinine, Ser  0.77 0.57 - 1.00 mg/dL   eGFR 79 >40 fO/fpw/8.26   BUN/Creatinine Ratio 21 12 - 28   Sodium 136 134 - 144 mmol/L   Potassium 4.2 3.5 - 5.2 mmol/L   Chloride 97 96 - 106 mmol/L   CO2 24 20 - 29 mmol/L   Calcium  9.4 8.7 - 10.3 mg/dL  CBC with Differential/Platelet   Collection Time: 01/29/24  3:20 PM  Result Value Ref Range   WBC 9.0 3.4 - 10.8 x10E3/uL   RBC 4.15 3.77 - 5.28 x10E6/uL   Hemoglobin 13.9 11.1 - 15.9 g/dL   Hematocrit 58.2 65.9 - 46.6 %   MCV 101 (H) 79 - 97 fL   MCH 33.5 (H) 26.6 - 33.0 pg   MCHC 33.3 31.5 - 35.7 g/dL   RDW 88.1 88.2 - 84.5 %   Platelets 265 150 - 450 x10E3/uL   Neutrophils 56 Not Estab. %   Lymphs 29 Not Estab. %   Monocytes 10 Not Estab. %   Eos 3 Not Estab. %   Basos 2 Not Estab. %   Neutrophils Absolute 5.1 1.4 - 7.0 x10E3/uL   Lymphocytes Absolute 2.6 0.7 - 3.1 x10E3/uL   Monocytes Absolute 0.9  0.1 - 0.9 x10E3/uL   EOS (ABSOLUTE) 0.3 0.0 - 0.4 x10E3/uL   Basophils Absolute 0.2 0.0 - 0.2 x10E3/uL   Immature Granulocytes 0 Not Estab. %   Immature Grans (Abs) 0.0 0.0 - 0.1 x10E3/uL  TSH   Collection Time: 01/29/24  3:20 PM  Result Value Ref Range   TSH 2.380 0.450 - 4.500 uIU/mL  Vitamin B12   Collection Time: 01/29/24  3:20 PM  Result Value Ref Range   Vitamin B-12 477 232 - 1,245 pg/mL      Assessment & Plan:   Assessment & Plan Acute cough Reports hx of COPD, coughing x 8-9 days with fever, chills, body aches, feeling SOB in clinic.  Will send sterapred dosepack, doxycycline  and tessalon  perles.  Lungs clear to auscultation, follow up in 1 week for lung recheck. Orders:   predniSONE  (STERAPRED UNI-PAK 21 TAB) 10 MG (21) TBPK tablet; Take 6 tablets (60 mg total) by mouth daily for 1 day, THEN 5 tablets (50 mg total) daily for 1 day, THEN 4 tablets (40 mg total) daily for 1 day, THEN 3 tablets (30 mg total) daily for 1 day, THEN 2 tablets (20 mg total) daily for 1 day, THEN 1 tablet (10 mg total) daily for 1 day.   benzonatate  (TESSALON ) 100 MG capsule; Take 1 capsule (100 mg total) by mouth 2 (two) times daily as needed for cough.   doxycycline  (VIBRA -TABS) 100 MG tablet; Take 1 tablet (100 mg total) by mouth 2 (two) times daily for 7 days.  Simple chronic bronchitis (HCC) Complete steroid dose pack and use albuterol  inhaler if needed for bronchospasm.  Follow up in 1 week.     Follow up plan: Return in about 1 week (around 05/02/2024) for lung recheck.      "

## 2024-04-25 NOTE — Assessment & Plan Note (Signed)
 Complete steroid dose pack and use albuterol  inhaler if needed for bronchospasm.  Follow up in 1 week.

## 2024-05-02 ENCOUNTER — Ambulatory Visit

## 2024-05-09 ENCOUNTER — Ambulatory Visit: Admitting: Nurse Practitioner

## 2024-06-08 ENCOUNTER — Ambulatory Visit: Admitting: Nurse Practitioner

## 2024-09-15 ENCOUNTER — Ambulatory Visit
